# Patient Record
Sex: Female | Born: 1935
Health system: Southern US, Community
[De-identification: ages and names within clinical notes are randomized; demographics above are authoritative.]

## PROBLEM LIST (undated history)

## (undated) DIAGNOSIS — E785 Hyperlipidemia, unspecified: Secondary | ICD-10-CM

## (undated) DIAGNOSIS — I10 Essential (primary) hypertension: Secondary | ICD-10-CM

## (undated) HISTORY — DX: Essential (primary) hypertension: I10

## (undated) HISTORY — PX: ABDOMINAL HYSTERECTOMY: SHX81

## (undated) HISTORY — DX: Hyperlipidemia, unspecified: E78.5

## (undated) HISTORY — PX: TONSILLECTOMY: SUR1361

---

## 2001-03-30 ENCOUNTER — Other Ambulatory Visit: Admission: RE | Admit: 2001-03-30 | Discharge: 2001-03-30 | Payer: Self-pay | Admitting: Family Medicine

## 2001-11-05 ENCOUNTER — Encounter: Admission: RE | Admit: 2001-11-05 | Discharge: 2001-11-05 | Payer: Self-pay | Admitting: Family Medicine

## 2001-11-05 ENCOUNTER — Encounter: Payer: Self-pay | Admitting: Family Medicine

## 2003-05-20 ENCOUNTER — Other Ambulatory Visit: Admission: RE | Admit: 2003-05-20 | Discharge: 2003-05-20 | Payer: Self-pay | Admitting: Family Medicine

## 2004-05-04 ENCOUNTER — Encounter: Admission: RE | Admit: 2004-05-04 | Discharge: 2004-08-02 | Payer: Self-pay | Admitting: Family Medicine

## 2005-01-01 ENCOUNTER — Encounter: Admission: RE | Admit: 2005-01-01 | Discharge: 2005-01-01 | Payer: Self-pay | Admitting: Family Medicine

## 2005-10-14 ENCOUNTER — Encounter: Admission: RE | Admit: 2005-10-14 | Discharge: 2005-10-14 | Payer: Self-pay | Admitting: Family Medicine

## 2007-01-14 ENCOUNTER — Encounter: Admission: RE | Admit: 2007-01-14 | Discharge: 2007-01-14 | Payer: Self-pay | Admitting: Family Medicine

## 2008-04-25 ENCOUNTER — Encounter: Admission: RE | Admit: 2008-04-25 | Discharge: 2008-04-25 | Payer: Self-pay | Admitting: Family Medicine

## 2008-06-21 LAB — CONVERTED CEMR LAB: Pap Smear: NORMAL

## 2008-12-22 ENCOUNTER — Ambulatory Visit: Payer: Self-pay | Admitting: Internal Medicine

## 2008-12-22 DIAGNOSIS — I10 Essential (primary) hypertension: Secondary | ICD-10-CM | POA: Insufficient documentation

## 2008-12-22 DIAGNOSIS — R32 Unspecified urinary incontinence: Secondary | ICD-10-CM | POA: Insufficient documentation

## 2009-03-23 ENCOUNTER — Telehealth: Payer: Self-pay | Admitting: Internal Medicine

## 2009-04-26 ENCOUNTER — Ambulatory Visit: Payer: Self-pay | Admitting: Internal Medicine

## 2009-06-12 ENCOUNTER — Encounter: Admission: RE | Admit: 2009-06-12 | Discharge: 2009-06-12 | Payer: Self-pay | Admitting: Internal Medicine

## 2009-07-10 ENCOUNTER — Telehealth: Payer: Self-pay | Admitting: Internal Medicine

## 2009-07-13 ENCOUNTER — Telehealth: Payer: Self-pay | Admitting: Internal Medicine

## 2009-10-09 ENCOUNTER — Telehealth: Payer: Self-pay | Admitting: Internal Medicine

## 2010-09-09 LAB — CONVERTED CEMR LAB
ALT: 16 units/L (ref 0–35)
AST: 15 units/L (ref 0–37)
Albumin: 3.9 g/dL (ref 3.5–5.2)
Alkaline Phosphatase: 54 units/L (ref 39–117)
BUN: 12 mg/dL (ref 6–23)
Basophils Absolute: 0.1 10*3/uL (ref 0.0–0.1)
Basophils Relative: 1 % (ref 0.0–3.0)
Bilirubin Urine: NEGATIVE
Bilirubin, Direct: 0.1 mg/dL (ref 0.0–0.3)
CO2: 31 meq/L (ref 19–32)
Calcium: 10 mg/dL (ref 8.4–10.5)
Chloride: 104 meq/L (ref 96–112)
Cholesterol: 213 mg/dL — ABNORMAL HIGH (ref 0–200)
Creatinine, Ser: 1.2 mg/dL (ref 0.4–1.2)
Direct LDL: 130.3 mg/dL
Eosinophils Absolute: 0.4 10*3/uL (ref 0.0–0.7)
Eosinophils Relative: 4.7 % (ref 0.0–5.0)
GFR calc non Af Amer: 46.57 mL/min (ref >60)
Glucose, Bld: 96 mg/dL (ref 70–99)
HCT: 40.2 % (ref 36.0–46.0)
HDL: 55 mg/dL (ref >39.00)
Hemoglobin: 13.8 g/dL (ref 12.0–15.0)
Ketones, ur: NEGATIVE mg/dL
Leukocytes, UA: NEGATIVE
Lymphocytes Relative: 28.5 % (ref 12.0–46.0)
Lymphs Abs: 2.2 10*3/uL (ref 0.7–4.0)
MCHC: 34.2 g/dL (ref 30.0–36.0)
MCV: 93.1 fL (ref 78.0–100.0)
Monocytes Absolute: 0.7 10*3/uL (ref 0.1–1.0)
Monocytes Relative: 9.7 % (ref 3.0–12.0)
Neutro Abs: 4.3 10*3/uL (ref 1.4–7.7)
Neutrophils Relative %: 56.1 % (ref 43.0–77.0)
Nitrite: NEGATIVE
Platelets: 165 10*3/uL (ref 150.0–400.0)
Potassium: 4.4 meq/L (ref 3.5–5.1)
RBC: 4.32 M/uL (ref 3.87–5.11)
RDW: 11.9 % (ref 11.5–14.6)
Sodium: 142 meq/L (ref 135–145)
Specific Gravity, Urine: 1.025 (ref 1.000–1.030)
TSH: 1.01 microintl units/mL (ref 0.35–5.50)
Total Bilirubin: 0.9 mg/dL (ref 0.3–1.2)
Total CHOL/HDL Ratio: 4
Total Protein, Urine: NEGATIVE mg/dL
Total Protein: 6.8 g/dL (ref 6.0–8.3)
Triglycerides: 182 mg/dL — ABNORMAL HIGH (ref 0.0–149.0)
Urine Glucose: NEGATIVE mg/dL
Urobilinogen, UA: 0.2 (ref 0.0–1.0)
VLDL: 36.4 mg/dL (ref 0.0–40.0)
WBC: 7.7 10*3/uL (ref 4.5–10.5)
pH: 6 (ref 5.0–8.0)

## 2010-09-13 NOTE — Progress Notes (Signed)
Summary: MICARDIS  Phone Note Call from Patient   Summary of Call: See previous phone note, Rx pending signature Initial call taken by: Lamar Sprinkles, CMA,  July 13, 2009 6:50 PM  Follow-up for Phone Call        Pt informed to pick up Follow-up by: Lamar Sprinkles, CMA,  July 14, 2009 11:41 AM    New/Updated Medications: MICARDIS 40 MG TABS (TELMISARTAN) 1 once daily Prescriptions: MICARDIS 40 MG TABS (TELMISARTAN) 1 once daily  #90 x 3   Entered by:   Lamar Sprinkles, CMA   Authorized by:   Etta Grandchild MD   Signed by:   Lamar Sprinkles, CMA on 07/13/2009   Method used:   Print then Give to Patient   RxID:   [id number]

## 2010-09-13 NOTE — Progress Notes (Signed)
Summary: change med  Phone Note Refill Request Message from:  Fax from Pharmacy on October 09, 2009 1:56 PM  Refills Requested: Medication #1:  TOPROL XL 50 MG XR24H-TAB Take 1 tablet by mouth once a day Note wants to know if patient can change to Metoprolol Tartrate 50mg .   Please advise.  Initial call taken by: Lucious Groves,  October 09, 2009 1:56 PM  Follow-up for Phone Call        done Follow-up by: Etta Grandchild MD,  October 09, 2009 3:25 PM    New/Updated Medications: METOPROLOL TARTRATE 25 MG TABS (METOPROLOL TARTRATE) One by mouth BID Prescriptions: METOPROLOL TARTRATE 25 MG TABS (METOPROLOL TARTRATE) One by mouth BID  #60 x 11   Entered and Authorized by:   Etta Grandchild MD   Signed by:   Etta Grandchild MD on 10/09/2009   Method used:   Electronically to        [facility]* (retail)       [address] [place], [place]  [address]       Ph: [phone]       Fax: [phone]   RxID:   [id number]

## 2010-09-13 NOTE — Letter (Signed)
Summary: Lipid Letter  [facility]  [address] [name], [place] [address]   Phone: [phone]  Fax: [phone]    04/26/2009  Marcelia Petersen [address] [place], [place]  [address]  Dear Ms. Fullilove:  We have carefully reviewed your last lipid profile from  and the results are noted below with a summary of recommendations for lipid management.    Cholesterol:       213     Goal: <200   HDL "good" Cholesterol:   40.34     Goal: >40   LDL "bad" Cholesterol:   130     Goal: <130   Triglycerides:       182.0     Goal: <150    the fat level in your body is too high. other labs look  great.    TLC Diet (Therapeutic Lifestyle Change): Saturated Fats & Transfatty acids should be kept < 7% of total calories ***Reduce Saturated Fats Polyunstaurated Fat can be up to 10% of total calories Monounsaturated Fat Fat can be up to 20% of total calories Total Fat should be no greater than 25-35% of total calories Carbohydrates should be 50-60% of total calories Protein should be approximately 15% of total calories Fiber should be at least 20-30 grams a day ***Increased fiber may help lower LDL Total Cholesterol should be < 200mg /day Consider adding plant stanol/sterols to diet (example: Benacol spread) ***A higher intake of unsaturated fat may reduce Triglycerides and Increase HDL    Adjunctive Measures (may lower LIPIDS and reduce risk of Heart Attack) include: Aerobic Exercise (20-30 minutes 3-4 times a week) Limit Alcohol Consumption Weight Reduction Aspirin 75-81 mg a day by mouth (if not allergic or contraindicated) Dietary Fiber 20-30 grams a day by mouth     Current Medications: 1)    Toprol Xl 50 Mg Xr24h-tab (Metoprolol succinate) .... Take 1 tablet by mouth once a day 2)    Multivitamins   Tabs (Multiple vitamin) .... Take 1 tablet by mouth once a day  If you have any questions, please call. We appreciate being able to work with you.   Sincerely,     [facility] Etta Grandchild MD

## 2010-09-13 NOTE — Progress Notes (Signed)
Summary: refill  Phone Note Refill Request Message from:  Fax from Pharmacy on March 23, 2009 12:07 PM  Refills Requested: Medication #1:  PREMARIN 0.45 MG TABS Take 1 tablet by mouth once a day  Medication #2:  PREMARIN 0.45 MG TABS Take 1 tablet by mouth once a day  Method Requested: Electronic Initial call taken by: Rock Nephew CMA,  March 23, 2009 12:07 PM    Prescriptions: PREMARIN 0.45 MG TABS (ESTROGENS CONJUGATED) Take 1 tablet by mouth once a day  #90 x 1   Entered by:   Rock Nephew CMA   Authorized by:   Etta Grandchild MD   Signed by:   Rock Nephew CMA on 03/23/2009   Method used:   Electronically to        [facility]* (retail)       [address] [place], [place]  [address]       Ph: [phone]       Fax: [phone]   RxID:   [id number] TOPROL XL 50 MG XR24H-TAB (METOPROLOL SUCCINATE) Take 1 tablet by mouth once a day  #90 x 1   Entered by:   Rock Nephew CMA   Authorized by:   Etta Grandchild MD   Signed by:   Rock Nephew CMA on 03/23/2009   Method used:   Electronically to        [facility]* (retail)       [address] [place], [place]  [address]       Ph: [phone]       Fax: [phone]   RxID:   [id number]

## 2010-09-13 NOTE — Assessment & Plan Note (Signed)
Summary: YEARLY FU/ MEDICARE/ LABS AFTER/NWS $50   Vital Signs:  Patient profile:   77 year old female Height:      66 inches Weight:      166 pounds BMI:     26.89 O2 Sat:      96 % on Room air Temp:     97.3 degrees F oral Pulse rate:   60 / minute Pulse rhythm:   regular Resp:     16 per minute BP sitting:   108 / 64  (left arm) Cuff size:   large  Vitals Entered By: Rock Nephew CMA (April 26, 2009 8:47 AM)  Nutrition Counseling: Patient's BMI is greater than 25 and therefore counseled on weight management options.  O2 Flow:  Room air  Primary Care Provider:  Etta Grandchild MD   History of Present Illness: She reports for a complete physical and has no complaints.  Hypertension History:      She denies headache, chest pain, palpitations, dyspnea with exertion, orthopnea, PND, peripheral edema, visual symptoms, neurologic problems, syncope, and side effects from treatment.  She notes no problems with any antihypertensive medication side effects.        Positive major cardiovascular risk factors include female age 26 years old or older and hypertension.  Negative major cardiovascular risk factors include no history of diabetes or hyperlipidemia, negative family history for ischemic heart disease, and non-tobacco-user status.        Further assessment for target organ damage reveals no history of ASHD, cardiac end-organ damage (CHF/LVH), stroke/TIA, peripheral vascular disease, renal insufficiency, or hypertensive retinopathy.     Clinical Review Panels:  Prevention   Last Mammogram:  Normal Bilateral (05/24/2008)   Last Pap Smear:  Normal (06/21/2008)   Current Medications (verified): 1)  Toprol Xl 50 Mg Xr24h-Tab (Metoprolol Succinate) .... Take 1 Tablet By Mouth Once A Day 2)  Multivitamins   Tabs (Multiple Vitamin) .... Take 1 Tablet By Mouth Once A Day 3)  Premarin 0.45 Mg Tabs (Estrogens Conjugated) .... Take 1 Tablet By Mouth Once A Day  Allergies  (verified): 1)  ! Codeine  Past History:  Past Medical History: Reviewed history from 12/22/2008 and no changes required. Hypertension Urinary incontinence  Past Surgical History: Reviewed history from 12/22/2008 and no changes required. Hysterectomy Tonsillectomy  Family History: Reviewed history from 12/22/2008 and no changes required. Family History Diabetes 1st degree relative Family History High cholesterol Family History of Stroke M 1st degree relative <50  Social History: Reviewed history from 12/22/2008 and no changes required. Retired Married Alcohol use-yes Drug use-no Regular exercise-no  Review of Systems  The patient denies anorexia, fever, weight loss, weight gain, dyspnea on exertion, peripheral edema, prolonged cough, headaches, hemoptysis, abdominal pain, melena, hematochezia, severe indigestion/heartburn, hematuria, incontinence, suspicious skin lesions, difficulty walking, depression, enlarged lymph nodes, angioedema, and breast masses.   GU:  Denies abnormal vaginal bleeding, discharge, dysuria, nocturia, urinary frequency, and urinary hesitancy.  Physical Exam  General:  alert, well-developed, well-nourished, well-hydrated, normal appearance, healthy-appearing, cooperative to examination, and good hygiene.   Head:  normocephalic and atraumatic.   Eyes:  vision grossly intact, pupils equal, and pupils round.   Mouth:  Oral mucosa and oropharynx without lesions or exudates.  Teeth in good repair. Neck:  supple, full ROM, no masses, no thyromegaly, no thyroid nodules or tenderness, normal carotid upstroke, no carotid bruits, and no cervical lymphadenopathy.   Chest Wall:  No deformities, masses, or tenderness noted. Breasts:  No mass,  nodules, thickening, tenderness, bulging, retraction, inflamation, nipple discharge or skin changes noted.   Lungs:  Normal respiratory effort, chest expands symmetrically. Lungs are clear to auscultation, no crackles or  wheezes. Heart:  Normal rate and regular rhythm. S1 and S2 normal without gallop, murmur, click, rub or other extra sounds. Abdomen:  soft, non-tender, normal bowel sounds, no distention, no masses, no guarding, no rigidity, no rebound tenderness, no hepatomegaly, and no splenomegaly.   Rectal:  no external abnormalities, normal sphincter tone, no masses, no tenderness, no fissures, no fistulae, no perianal rash, and external hemorrhoid(s).  heme negative stool. Msk:  normal ROM, no joint tenderness, no joint swelling, and no joint warmth.   Pulses:  R and L carotid,radial,femoral,dorsalis pedis and posterior tibial pulses are full and equal bilaterally Extremities:  No clubbing, cyanosis, edema, or deformity noted with normal full range of motion of all joints.   Neurologic:  No cranial nerve deficits noted. Station and gait are normal. Plantar reflexes are down-going bilaterally. DTRs are symmetrical throughout. Sensory, motor and coordinative functions appear intact. Skin:  turgor normal, color normal, no rashes, no suspicious lesions, no ecchymoses, no petechiae, no purpura, no ulcerations, no edema, actinic keratosis, and seborrheic keratosis.   Cervical Nodes:  No lymphadenopathy noted Axillary Nodes:  No palpable lymphadenopathy Inguinal Nodes:  No significant adenopathy Psych:  Cognition and judgment appear intact. Alert and cooperative with normal attention span and concentration. No apparent delusions, illusions, hallucinations Additional Exam:  EKG shows NSR with poor RWP but no Q waves, no LVH, and no ST/T wave abnormalities.   Impression & Recommendations:  Problem # 1:  ROUTINE GENERAL MEDICAL EXAM@HEALTH  CARE FACL (ICD-V70.0) Assessment New she refuses to do a colonoscopy but has a mammogram scheduled and has had a recent normal PAP smear, so those issues are addressed. Orders: Venipuncture (25956) TLB-Lipid Panel (80061-LIPID) TLB-BMP (Basic Metabolic Panel-BMET)  (80048-METABOL) TLB-CBC Platelet - w/Differential (85025-CBCD) TLB-Hepatic/Liver Function Pnl (80076-HEPATIC) TLB-TSH (Thyroid Stimulating Hormone) (84443-TSH) TLB-Udip w/ Micro (81001-URINE) Hemoccult Guaiac-1 spec.(in office) (82270) EKG w/ Interpretation (93000)  Problem # 2:  HYPERTENSION (ICD-401.9) Assessment: Improved  Her updated medication list for this problem includes:    Toprol Xl 50 Mg Xr24h-tab (Metoprolol succinate) .Marland Kitchen... Take 1 tablet by mouth once a day  Orders: Venipuncture (38756) TLB-Lipid Panel (80061-LIPID) TLB-BMP (Basic Metabolic Panel-BMET) (80048-METABOL) TLB-CBC Platelet - w/Differential (85025-CBCD) TLB-Hepatic/Liver Function Pnl (80076-HEPATIC) TLB-TSH (Thyroid Stimulating Hormone) (84443-TSH) TLB-Udip w/ Micro (81001-URINE) EKG w/ Interpretation (93000)  Problem # 3:  URINARY INCONTINENCE (ICD-788.30) Assessment: Improved  Orders: Venipuncture (43329) TLB-Lipid Panel (80061-LIPID) TLB-BMP (Basic Metabolic Panel-BMET) (80048-METABOL) TLB-CBC Platelet - w/Differential (85025-CBCD) TLB-Hepatic/Liver Function Pnl (80076-HEPATIC) TLB-TSH (Thyroid Stimulating Hormone) (84443-TSH) TLB-Udip w/ Micro (81001-URINE)  Complete Medication List: 1)  Toprol Xl 50 Mg Xr24h-tab (Metoprolol succinate) .... Take 1 tablet by mouth once a day 2)  Multivitamins Tabs (Multiple vitamin) .... Take 1 tablet by mouth once a day  Hypertension Assessment/Plan:      The patient's hypertensive risk group is category B: At least one risk factor (excluding diabetes) with no target organ damage.  Today's blood pressure is 108/64.  Her blood pressure goal is < 140/90.  Patient Instructions: 1)  Please schedule a follow-up appointment in 6 months. 2)  It is important that you exercise regularly at least 20 minutes 5 times a week. If you develop chest pain, have severe difficulty breathing, or feel very tired , stop exercising immediately and seek medical attention. 3)  You  need to lose  weight. Consider a lower calorie diet and regular exercise.  4)  Schedule your mammogram. 5)  Check your Blood Pressure regularly. If it is above 140/90: you should make an appointment. Prescriptions: TOPROL XL 50 MG XR24H-TAB (METOPROLOL SUCCINATE) Take 1 tablet by mouth once a day  #90 x 3   Entered and Authorized by:   Etta Grandchild MD   Signed by:   Etta Grandchild MD on 04/26/2009   Method used:   Print then Give to Patient   RxID:   [id number]

## 2010-09-13 NOTE — Progress Notes (Signed)
Summary: TOPROL  Phone Note Call from Patient   Summary of Call: Pt lost rx for toprol, ok to reprint for pt to pick up?  Initial call taken by: Lamar Sprinkles, CMA,  July 10, 2009 2:43 PM  Follow-up for Phone Call        yes Follow-up by: Etta Grandchild MD,  July 10, 2009 2:57 PM     Appended Document: TOPROL patient notified to pick up/la

## 2010-09-13 NOTE — Assessment & Plan Note (Signed)
Summary: NEW PT MEIDCARE/$50/PAPER WORK/PN   Vital Signs:  Patient profile:   77 year old female Height:      66 inches Weight:      170 pounds BMI:     27.54 O2 Sat:      97 % Temp:     98.0 degrees F oral Pulse rate:   55 / minute BP sitting:   132 / 82  (left arm) Cuff size:   large  Vitals Entered By: Zackery Barefoot CMA (Dec 22, 2008 4:03 PM)  Nutrition Counseling: Patient's BMI is greater than 25 and therefore counseled on weight management options.  Primary Care Provider:  Etta Grandchild MD   History of Present Illness: This is a new pt. to me who has no complaints. She simply needs a new PCP.  Hypertension History:      She denies headache, chest pain, palpitations, dyspnea with exertion, orthopnea, PND, peripheral edema, visual symptoms, neurologic problems, syncope, and side effects from treatment.  She notes no problems with any antihypertensive medication side effects.        Positive major cardiovascular risk factors include female age 65 years old or older and hypertension.  Negative major cardiovascular risk factors include no history of diabetes or hyperlipidemia, negative family history for ischemic heart disease, and non-tobacco-user status.        Further assessment for target organ damage reveals no history of ASHD, cardiac end-organ damage (CHF/LVH), stroke/TIA, peripheral vascular disease, renal insufficiency, or hypertensive retinopathy.     Preventive Screening-Counseling & Management     Alcohol drinks/day: <1     Alcohol type: wine     >5/day in last 3 mos: no     Alcohol Counseling: not indicated; use of alcohol is not excessive or problematic     Feels need to cut down: no     Feels annoyed by complaints: no     Feels guilty re: drinking: no     Needs 'eye opener' in am: no     Smoking Status: quit     Does Patient Exercise: no      Drug Use:  no.    Current Medications (verified): 1)  Toprol Xl 50 Mg Xr24h-Tab (Metoprolol Succinate) ....  Take 1 Tablet By Mouth Once A Day 2)  Multivitamins   Tabs (Multiple Vitamin) .... Take 1 Tablet By Mouth Once A Day 3)  Premarin 0.45 Mg Tabs (Estrogens Conjugated) .... Take 1 Tablet By Mouth Once A Day 4)  Toviaz 4 Mg Xr24h-Tab (Fesoterodine Fumarate) .... Take 1 Tablet By Mouth Once A Day  Allergies (verified): 1)  ! Codeine  Past History:  Past Medical History:    Hypertension    Urinary incontinence  Past Surgical History:    Hysterectomy    Tonsillectomy  Family History:    Family History Diabetes 1st degree relative    Family History High cholesterol    Family History of Stroke M 1st degree relative <50  Social History:    Retired    Married    Alcohol use-yes    Drug use-no    Regular exercise-no    Smoking Status:  quit    Drug Use:  no    Does Patient Exercise:  no  Review of Systems  The patient denies weight loss, weight gain, abdominal pain, difficulty walking, depression, and breast masses.    Physical Exam  General:  alert, well-developed, well-nourished, well-hydrated, and overweight-appearing.   Eyes:  vision grossly intact and  pupils equal.   Neck:  supple, full ROM, no masses, no thyromegaly, no thyroid nodules or tenderness, normal carotid upstroke, and no carotid bruits.   Lungs:  Normal respiratory effort, chest expands symmetrically. Lungs are clear to auscultation, no crackles or wheezes. Heart:  Normal rate and regular rhythm. S1 and S2 normal without gallop, murmur, click, rub or other extra sounds. Abdomen:  soft, non-tender, normal bowel sounds, no distention, no masses, no guarding, no hepatomegaly, and no splenomegaly.   Msk:  normal ROM, no joint tenderness, no joint swelling, no joint warmth, no joint instability, and no crepitation.   Pulses:  R and L carotid,radial,femoral,dorsalis pedis and posterior tibial pulses are full and equal bilaterally Extremities:  No clubbing, cyanosis, edema, or deformity noted with normal full range of  motion of all joints.   Neurologic:  No cranial nerve deficits noted. Station and gait are normal. Plantar reflexes are down-going bilaterally. DTRs are symmetrical throughout. Sensory, motor and coordinative functions appear intact. Skin:  turgor normal, color normal, no rashes, no suspicious lesions, and no ecchymoses.   Cervical Nodes:  No lymphadenopathy noted Psych:  Cognition and judgment appear intact. Alert and cooperative with normal attention span and concentration. No apparent delusions, illusions, hallucinations   Impression & Recommendations:  Problem # 1:  HYPERTENSION (ICD-401.9) Assessment Improved  Her updated medication list for this problem includes:    Toprol Xl 50 Mg Xr24h-tab (Metoprolol succinate) .Marland Kitchen... Take 1 tablet by mouth once a day  Complete Medication List: 1)  Toprol Xl 50 Mg Xr24h-tab (Metoprolol succinate) .... Take 1 tablet by mouth once a day 2)  Multivitamins Tabs (Multiple vitamin) .... Take 1 tablet by mouth once a day 3)  Premarin 0.45 Mg Tabs (Estrogens conjugated) .... Take 1 tablet by mouth once a day 4)  Toviaz 4 Mg Xr24h-tab (Fesoterodine fumarate) .... Take 1 tablet by mouth once a day  Hypertension Assessment/Plan:      The patient's hypertensive risk group is category B: At least one risk factor (excluding diabetes) with no target organ damage.  Today's blood pressure is 132/82.  Her blood pressure goal is < 140/90.  Colorectal Screening:  Current Recommendations:    Colonoscopy recommended: patient defers today but will consider in the future  PAP Screening:    Hx Cervical Dysplasia in last 5 yrs? No    3 normal PAP smears in last 5 yrs? Yes    Last PAP smear:  06/21/2008  PAP Smear Results:    Date of Exam:  06/21/2008    Results:  Normal  Mammogram Screening:    Last Mammogram:  05/24/2008  Mammogram Results:    Date of Exam:  05/24/2008    Results:  Normal Bilateral  Osteoporosis Risk Assessment:  Risk Factors for  Fracture or Low Bone Density:   Race (White or Asian):     yes   Smoking status:       quit  Patient Instructions: 1)  Please schedule a follow-up appointment in 4 months. 2)  It is important that you exercise regularly at least 20 minutes 5 times a week. If you develop chest pain, have severe difficulty breathing, or feel very tired , stop exercising immediately and seek medical attention. 3)  You need to lose weight. Consider a lower calorie diet and regular exercise.  4)  Schedule your mammogram. 5)  Check your Blood Pressure regularly. If it is above: you should make an appointment.

## 2010-09-13 NOTE — Progress Notes (Signed)
Summary: MICARDIS  Phone Note Call from Patient Call back at [facility] Phone [phone]   Summary of Call: Pt says that rx that she picked up was wrong. She wants rx for Micardis 40mg . OK?  Initial call taken by: Lamar Sprinkles, CMA,  July 13, 2009 8:51 AM  Follow-up for Phone Call        sure Follow-up by: Etta Grandchild MD,  July 13, 2009 9:00 AM

## 2010-09-20 ENCOUNTER — Encounter: Payer: Self-pay | Admitting: Internal Medicine

## 2010-09-20 ENCOUNTER — Encounter (INDEPENDENT_AMBULATORY_CARE_PROVIDER_SITE_OTHER): Payer: Self-pay | Admitting: *Deleted

## 2010-09-20 ENCOUNTER — Other Ambulatory Visit: Payer: Medicare Other

## 2010-09-20 ENCOUNTER — Encounter (INDEPENDENT_AMBULATORY_CARE_PROVIDER_SITE_OTHER): Payer: Medicare Other | Admitting: Internal Medicine

## 2010-09-20 ENCOUNTER — Other Ambulatory Visit: Payer: Self-pay | Admitting: Internal Medicine

## 2010-09-20 DIAGNOSIS — R9431 Abnormal electrocardiogram [ECG] [EKG]: Secondary | ICD-10-CM

## 2010-09-20 DIAGNOSIS — Z2911 Encounter for prophylactic immunotherapy for respiratory syncytial virus (RSV): Secondary | ICD-10-CM

## 2010-09-20 DIAGNOSIS — Z23 Encounter for immunization: Secondary | ICD-10-CM

## 2010-09-20 DIAGNOSIS — I10 Essential (primary) hypertension: Secondary | ICD-10-CM

## 2010-09-20 DIAGNOSIS — E785 Hyperlipidemia, unspecified: Secondary | ICD-10-CM | POA: Insufficient documentation

## 2010-09-20 DIAGNOSIS — Z0000 Encounter for general adult medical examination without abnormal findings: Secondary | ICD-10-CM

## 2010-09-20 DIAGNOSIS — Z136 Encounter for screening for cardiovascular disorders: Secondary | ICD-10-CM

## 2010-09-20 LAB — CBC WITH DIFFERENTIAL/PLATELET
Basophils Absolute: 0.1 10*3/uL (ref 0.0–0.1)
Basophils Relative: 0.8 % (ref 0.0–3.0)
Eosinophils Absolute: 0.5 10*3/uL (ref 0.0–0.7)
Eosinophils Relative: 4.9 % (ref 0.0–5.0)
HCT: 43 % (ref 36.0–46.0)
Hemoglobin: 15 g/dL (ref 12.0–15.0)
Lymphocytes Relative: 29.3 % (ref 12.0–46.0)
Lymphs Abs: 2.8 10*3/uL (ref 0.7–4.0)
MCHC: 34.8 g/dL (ref 30.0–36.0)
MCV: 90.9 fl (ref 78.0–100.0)
Monocytes Absolute: 0.9 10*3/uL (ref 0.1–1.0)
Monocytes Relative: 9.6 % (ref 3.0–12.0)
Neutro Abs: 5.2 10*3/uL (ref 1.4–7.7)
Neutrophils Relative %: 55.4 % (ref 43.0–77.0)
Platelets: 149 10*3/uL — ABNORMAL LOW (ref 150.0–400.0)
RBC: 4.73 Mil/uL (ref 3.87–5.11)
RDW: 13.1 % (ref 11.5–14.6)
WBC: 9.4 10*3/uL (ref 4.5–10.5)

## 2010-09-20 LAB — BASIC METABOLIC PANEL
BUN: 16 mg/dL (ref 6–23)
CO2: 26 mEq/L (ref 19–32)
Calcium: 10.2 mg/dL (ref 8.4–10.5)
Chloride: 102 mEq/L (ref 96–112)
Creatinine, Ser: 1 mg/dL (ref 0.4–1.2)
GFR: 54.73 mL/min — ABNORMAL LOW (ref >60.00)
Glucose, Bld: 84 mg/dL (ref 70–99)
Potassium: 4.6 mEq/L (ref 3.5–5.1)
Sodium: 135 mEq/L (ref 135–145)

## 2010-09-20 LAB — LDL CHOLESTEROL, DIRECT: Direct LDL: 132.6 mg/dL

## 2010-09-20 LAB — HEPATIC FUNCTION PANEL
ALT: 14 U/L (ref 0–35)
AST: 13 U/L (ref 0–37)
Albumin: 4 g/dL (ref 3.5–5.2)
Alkaline Phosphatase: 62 U/L (ref 39–117)
Bilirubin, Direct: 0.2 mg/dL (ref 0.0–0.3)
Total Bilirubin: 0.8 mg/dL (ref 0.3–1.2)
Total Protein: 6.9 g/dL (ref 6.0–8.3)

## 2010-09-20 LAB — LIPID PANEL
Cholesterol: 213 mg/dL — ABNORMAL HIGH (ref 0–200)
HDL: 55.8 mg/dL (ref >39.00)
Total CHOL/HDL Ratio: 4
Triglycerides: 191 mg/dL — ABNORMAL HIGH (ref 0.0–149.0)
VLDL: 38.2 mg/dL (ref 0.0–40.0)

## 2010-09-20 LAB — TSH: TSH: 1.26 u[IU]/mL (ref 0.35–5.50)

## 2010-09-24 ENCOUNTER — Other Ambulatory Visit: Payer: Self-pay | Admitting: Internal Medicine

## 2010-09-24 DIAGNOSIS — Z1231 Encounter for screening mammogram for malignant neoplasm of breast: Secondary | ICD-10-CM

## 2010-09-27 NOTE — Letter (Signed)
Summary: Lipid Letter  [facility]  [address] [place], [place] [address]   Phone: [phone]  Fax: [phone]    09/20/2010  [name] [address] [place], [place]  [address]  Dear Ms. Neumeister:  We have carefully reviewed your last lipid profile from  and the results are noted below with a summary of recommendations for lipid management.    Cholesterol:       213     Goal: <200   HDL "good" Cholesterol:   34.74     Goal: >50   LDL "bad" Cholesterol:   133     Goal: <130   Triglycerides:       191.0     Goal: <150 wow, high!        TLC Diet (Therapeutic Lifestyle Change): Saturated Fats & Transfatty acids should be kept < 7% of total calories ***Reduce Saturated Fats Polyunstaurated Fat can be up to 10% of total calories Monounsaturated Fat Fat can be up to 20% of total calories Total Fat should be no greater than 25-35% of total calories Carbohydrates should be 50-60% of total calories Protein should be approximately 15% of total calories Fiber should be at least 20-30 grams a day ***Increased fiber may help lower LDL Total Cholesterol should be < 200mg /day Consider adding plant stanol/sterols to diet (example: Benacol spread) ***A higher intake of unsaturated fat may reduce Triglycerides and Increase HDL    Adjunctive Measures (may lower LIPIDS and reduce risk of Heart Attack) include: Aerobic Exercise (20-30 minutes 3-4 times a week) Limit Alcohol Consumption Weight Reduction Aspirin 75-81 mg a day by mouth (if not allergic or contraindicated) Dietary Fiber 20-30 grams a day by mouth     Current Medications: 1)    Metoprolol Tartrate 25 Mg Tabs (Metoprolol tartrate) .... One by mouth bid 2)    Multivitamins   Tabs (Multiple vitamin) .... Take 1 tablet by mouth once a day  If you have any questions, please call. We appreciate being able to work with you.   Sincerely,    [facility] Etta Grandchild MD

## 2010-09-27 NOTE — Assessment & Plan Note (Signed)
Summary: YEARLY-MEDICARE---STC   Vital Signs:  Patient profile:   77 year old female Menstrual status:  hysterectomy Height:      66 inches Weight:      166 pounds BMI:     26.89 O2 Sat:      97 % on Room air Temp:     97.6 degrees F oral Pulse rate:   80 / minute Pulse rhythm:   regular Resp:     16 per minute BP supine:   104 / 70  (right arm) BP sitting:   102 / 68  (left arm) Cuff size:   large  Vitals Entered By: Rock Nephew CMA (September 20, 2010 10:58 AM)  Nutrition Counseling: Patient's BMI is greater than 25 and therefore counseled on weight management options.  O2 Flow:  Room air  Primary Care Provider:  Etta Grandchild MD   History of Present Illness: Here for Medicare AWV:  1.   Risk factors based on Past M, S, F history: yes 2.   Physical Activities: very good 3.   Depression/mood: mood is very good 4.   Hearing: hears whispered voice at 3 feet 5.   ADL's: thorough and independent 6.   Fall Risk: none noted 7.   Home Safety: very good 8.   Height, weight, &visual acuity: yes 9.   Counseling: done 10.   Labs ordered based on risk factors: done 11.           Referral Coordination: none at this time 12.           Care Plan: completed 13.            Cognitive Assessment : she responds well to all questions  Hypertension History:      She denies headache, chest pain, palpitations, dyspnea with exertion, orthopnea, PND, peripheral edema, visual symptoms, neurologic problems, syncope, and side effects from treatment.  She notes no problems with any antihypertensive medication side effects.        Positive major cardiovascular risk factors include female age 35 years old or older, hyperlipidemia, and hypertension.  Negative major cardiovascular risk factors include no history of diabetes, negative family history for ischemic heart disease, and non-tobacco-user status.        Further assessment for target organ damage reveals no history of ASHD, cardiac  end-organ damage (CHF/LVH), stroke/TIA, peripheral vascular disease, renal insufficiency, or hypertensive retinopathy.     Preventive Screening-Counseling & Management  Alcohol-Tobacco     Alcohol drinks/day: <1     Alcohol type: wine     >5/day in last 3 mos: no     Alcohol Counseling: not indicated; use of alcohol is not excessive or problematic     Feels need to cut down: no     Feels annoyed by complaints: no     Feels guilty re: drinking: no     Needs 'eye opener' in am: no     Smoking Status: quit     Tobacco Counseling: to remain off tobacco products  Hep-HIV-STD-Contraception     Hepatitis Risk: no risk noted     HIV Risk: no risk noted     STD Risk: no risk noted      Sexual History:  currently monogamous.        Drug Use:  never.        Blood Transfusions:  no.    Clinical Review Panels:  Prevention   Last Mammogram:  ASSESSMENT: Negative - BI-RADS 1^MM DIGITAL  SCREENING (06/12/2009)   Last Pap Smear:  Normal (06/21/2008)  Immunizations   Last Tetanus Booster:  Tdap (09/20/2010)   Last Flu Vaccine:  Historical (05/22/2010)   Last Zoster Vaccine:  Zostavax (09/20/2010)  Lipid Management   Cholesterol:  213 (04/26/2009)   HDL (good cholesterol):  55.00 (04/26/2009)  Diabetes Management   Creatinine:  1.2 (04/26/2009)   Last Flu Vaccine:  Historical (05/22/2010)  CBC   WBC:  7.7 (04/26/2009)   RBC:  4.32 (04/26/2009)   Hgb:  13.8 (04/26/2009)   Hct:  40.2 (04/26/2009)   Platelets:  165.0 (04/26/2009)   MCV  93.1 (04/26/2009)   MCHC  34.2 (04/26/2009)   RDW  11.9 (04/26/2009)   PMN:  56.1 (04/26/2009)   Lymphs:  28.5 (04/26/2009)   Monos:  9.7 (04/26/2009)   Eosinophils:  4.7 (04/26/2009)   Basophil:  1.0 (04/26/2009)  Complete Metabolic Panel   Glucose:  96 (04/26/2009)   Sodium:  142 (04/26/2009)   Potassium:  4.4 (04/26/2009)   Chloride:  104 (04/26/2009)   CO2:  31 (04/26/2009)   BUN:  12 (04/26/2009)   Creatinine:  1.2 (04/26/2009)    Albumin:  3.9 (04/26/2009)   Total Protein:  6.8 (04/26/2009)   Calcium:  10.0 (04/26/2009)   Total Bili:  0.9 (04/26/2009)   Alk Phos:  54 (04/26/2009)   SGPT (ALT):  16 (04/26/2009)   SGOT (AST):  15 (04/26/2009)   Medications Prior to Update: 1)  Metoprolol Tartrate 25 Mg Tabs (Metoprolol Tartrate) .... One By Mouth Bid 2)  Multivitamins   Tabs (Multiple Vitamin) .... Take 1 Tablet By Mouth Once A Day 3)  Micardis 40 Mg Tabs (Telmisartan) .Marland Kitchen.. 1 Once Daily  Current Medications (verified): 1)  Metoprolol Tartrate 25 Mg Tabs (Metoprolol Tartrate) .... One By Mouth Bid 2)  Multivitamins   Tabs (Multiple Vitamin) .... Take 1 Tablet By Mouth Once A Day  Allergies (verified): 1)  ! Codeine  Past History:  Past Surgical History: Last updated: 12/22/2008 Hysterectomy Tonsillectomy  Family History: Last updated: 12/22/2008 Family History Diabetes 1st degree relative Family History High cholesterol Family History of Stroke M 1st degree relative <50  Social History: Last updated: 12/22/2008 Retired Married Alcohol use-yes Drug use-no Regular exercise-no  Risk Factors: Alcohol Use: <1 (09/20/2010) >5 drinks/d w/in last 3 months: no (09/20/2010) Exercise: no (12/22/2008)  Risk Factors: Smoking Status: quit (09/20/2010)  Past Medical History: Hypertension Urinary incontinence Hyperlipidemia  Family History: Reviewed history from 12/22/2008 and no changes required. Family History Diabetes 1st degree relative Family History High cholesterol Family History of Stroke M 1st degree relative <50  Social History: Reviewed history from 12/22/2008 and no changes required. Retired Married Alcohol use-yes Drug use-no Regular exercise-no Hepatitis Risk:  no risk noted HIV Risk:  no risk noted STD Risk:  no risk noted Sexual History:  currently monogamous Drug Use:  never Blood Transfusions:  no  Review of Systems       The patient complains of weight gain.  The  patient denies anorexia, fever, weight loss, chest pain, syncope, dyspnea on exertion, peripheral edema, prolonged cough, headaches, hemoptysis, abdominal pain, melena, hematochezia, severe indigestion/heartburn, hematuria, suspicious skin lesions, transient blindness, difficulty walking, depression, unusual weight change, abnormal bleeding, enlarged lymph nodes, angioedema, and breast masses.    Physical Exam  General:  alert, well-developed, well-nourished, well-hydrated, normal appearance, healthy-appearing, cooperative to examination, and good hygiene.   Head:  normocephalic and atraumatic.   Eyes:  vision grossly intact, pupils equal, and  pupils round.   Mouth:  Oral mucosa and oropharynx without lesions or exudates.  Teeth in good repair. Neck:  supple, full ROM, no masses, no thyromegaly, no thyroid nodules or tenderness, normal carotid upstroke, no carotid bruits, and no cervical lymphadenopathy.   Breasts:  No mass, nodules, thickening, tenderness, bulging, retraction, inflamation, nipple discharge or skin changes noted.   Lungs:  Normal respiratory effort, chest expands symmetrically. Lungs are clear to auscultation, no crackles or wheezes. Heart:  Normal rate and regular rhythm. S1 and S2 normal without gallop, murmur, click, rub or other extra sounds. Abdomen:  soft, non-tender, normal bowel sounds, no distention, no masses, no guarding, no rigidity, no rebound tenderness, no hepatomegaly, and no splenomegaly.   Rectal:  deferred at her request Genitalia:  deferred at her request Msk:  normal ROM, no joint tenderness, no joint swelling, and no joint warmth.   Pulses:  R and L carotid,radial,femoral,dorsalis pedis and posterior tibial pulses are full and equal bilaterally Extremities:  No clubbing, cyanosis, edema, or deformity noted with normal full range of motion of all joints.   Neurologic:  No cranial nerve deficits noted. Station and gait are normal. Plantar reflexes are down-going  bilaterally. DTRs are symmetrical throughout. Sensory, motor and coordinative functions appear intact. Skin:  turgor normal, color normal, no rashes, no suspicious lesions, no ecchymoses, no petechiae, no purpura, no ulcerations, no edema, actinic keratosis, and seborrheic keratosis.   Cervical Nodes:  No lymphadenopathy noted Axillary Nodes:  no R axillary adenopathy and no L axillary adenopathy.   Inguinal Nodes:  no R inguinal adenopathy and no L inguinal adenopathy.   Psych:  Cognition and judgment appear intact. Alert and cooperative with normal attention span and concentration. No apparent delusions, illusions, hallucinations Additional Exam:  EKG is unchanged from prior EKG's   Impression & Recommendations:  Problem # 1:  HYPERLIPIDEMIA (ICD-272.4) Assessment New  Orders: Venipuncture (81191) TLB-Lipid Panel (80061-LIPID) TLB-BMP (Basic Metabolic Panel-BMET) (80048-METABOL) TLB-CBC Platelet - w/Differential (85025-CBCD) TLB-Hepatic/Liver Function Pnl (80076-HEPATIC) TLB-TSH (Thyroid Stimulating Hormone) (84443-TSH)  Problem # 2:  ROUTINE GENERAL MEDICAL EXAM@HEALTH  CARE FACL (ICD-V70.0) Assessment: Unchanged  Orders: Radiology Referral (Radiology) [facility] -Subsequent Annual Wellness Visit [phone])  Mammogram: ASSESSMENT: Negative - BI-RADS 1^MM DIGITAL SCREENING (06/12/2009) Pap smear: Normal (06/21/2008) Chol: 213 (04/26/2009)   HDL: 55.00 (04/26/2009)   TG: 182.0 (04/26/2009) TSH: 1.01 (04/26/2009)    Discussed using sunscreen, use of alcohol, drug use, self breast exam, routine dental care, routine eye care, schedule for GYN exam, routine physical exam, seat belts, multiple vitamins, osteoporosis prevention, adequate calcium intake in diet, recommendations for immunizations, mammograms and Pap smears.  Discussed exercise and checking cholesterol.  Discussed gun safety, safe sex, and contraception.  Problem # 3:  HYPERTENSION (ICD-401.9) Assessment: Improved  The following  medications were removed from the medication list:    Micardis 40 Mg Tabs (Telmisartan) .Marland Kitchen... 1 once daily Her updated medication list for this problem includes:    Metoprolol Tartrate 25 Mg Tabs (Metoprolol tartrate) ..... One by mouth bid  Orders: Venipuncture (56213) TLB-Lipid Panel (80061-LIPID) TLB-BMP (Basic Metabolic Panel-BMET) (80048-METABOL) TLB-CBC Platelet - w/Differential (85025-CBCD) TLB-Hepatic/Liver Function Pnl (80076-HEPATIC) TLB-TSH (Thyroid Stimulating Hormone) (84443-TSH)  BP today: 102/68 Prior BP: 108/64 (04/26/2009)  Prior 10 Yr Risk Heart Disease: Not enough information (12/22/2008)  Labs Reviewed: K+: 4.4 (04/26/2009) Creat: : 1.2 (04/26/2009)   Chol: 213 (04/26/2009)   HDL: 55.00 (04/26/2009)   TG: 182.0 (04/26/2009)  Problem # 4:  ABNORMAL ELECTROCARDIOGRAM (ICD-794.31) Assessment: New this  is a benign finding and is consistent with her history of lung disease Orders: EKG w/ Interpretation (93000)  Complete Medication List: 1)  Metoprolol Tartrate 25 Mg Tabs (Metoprolol tartrate) .... One by mouth bid 2)  Multivitamins Tabs (Multiple vitamin) .... Take 1 tablet by mouth once a day  Other Orders: Tdap => 67yrs IM (16109) Admin 1st Vaccine (60454) Zoster (Shingles) Vaccine Live [phone]) Admin of Any Addtl Vaccine (91478)  Hypertension Assessment/Plan:      The patient's hypertensive risk group is category B: At least one risk factor (excluding diabetes) with no target organ damage.  Today's blood pressure is 102/68.  Her blood pressure goal is < 140/90.  Colorectal Screening:  Current Recommendations:    Hemoccult: patient refused    Colonoscopy recommended: patient defers today but will consider in the future  PAP Screening:    Hx Cervical Dysplasia in last 5 yrs? No    3 normal PAP smears in last 5 yrs? Yes    Last PAP smear:  06/21/2008    Reviewed PAP smear recommendations:  patient refuses understanding risks of delayed  diagnosis  Mammogram Screening:    Last Mammogram:  06/12/2009    Reviewed Mammogram recommendations:  mammogram ordered  Osteoporosis Risk Assessment:  Risk Factors for Fracture or Low Bone Density:   Race (White or Asian):     yes   Smoking status:       quit  Immunization & Chemoprophylaxis:    Tetanus vaccine: Tdap  (09/20/2010)    Influenza vaccine: Historical  (05/22/2010)  Patient Instructions: 1)  Please schedule a follow-up appointment in 2 months. 2)  It is important that you exercise regularly at least 20 minutes 5 times a week. If you develop chest pain, have severe difficulty breathing, or feel very tired , stop exercising immediately and seek medical attention. 3)  You need to lose weight. Consider a lower calorie diet and regular exercise.  4)  Schedule your mammogram. 5)  Schedule a colonoscopy/sigmoidoscopy to help detect colon cancer. 6)  Take an Aspirin every day. 7)  Check your Blood Pressure regularly. If it is above 130/80: you should make an appointment.   Orders Added: 1)  Venipuncture [36415] 2)  TLB-Lipid Panel [80061-LIPID] 3)  TLB-BMP (Basic Metabolic Panel-BMET) [80048-METABOL] 4)  TLB-CBC Platelet - w/Differential [85025-CBCD] 5)  TLB-Hepatic/Liver Function Pnl [80076-HEPATIC] 6)  TLB-TSH (Thyroid Stimulating Hormone) [84443-TSH] 7)  Radiology Referral [Radiology] 8)  EKG w/ Interpretation [93000] 9)  [facility] -Subsequent Annual Wellness Visit [G0439] 10)  Tdap => 67yrs IM [90715] 11)  Admin 1st Vaccine [90471] 12)  Zoster (Shingles) Vaccine Live [29562] 13)  Admin of Any Addtl Vaccine [90472] 14)  Est. Patient Level III [13086]   Immunization History:  Influenza Immunization History:    Influenza:  historical (05/22/2010)  Immunizations Administered:  Tetanus Vaccine:    Vaccine Type: Tdap    Site: right deltoid    Mfr: GlaxoSmithKline    Dose: 0.5 ml    Route: IM    Given by: Rock Nephew CMA    Exp. Date: 05/31/2012    Lot #:  [id number]    VIS given: 06/29/08 version given September 20, 2010.  Zostavax # 1:    Vaccine Type: Zostavax    Site: right deltoid    Mfr: Merck    Dose: 0.65    Route: IM    Given by: Rock Nephew CMA    Exp. Date: 03/30/2011    Lot #: [id number]  VIS given: 05/24/05 given September 20, 2010.   Immunization History:  Influenza Immunization History:    Influenza:  Historical (05/22/2010)  Immunizations Administered:  Tetanus Vaccine:    Vaccine Type: Tdap    Site: right deltoid    Mfr: GlaxoSmithKline    Dose: 0.5 ml    Route: IM    Given by: Rock Nephew CMA    Exp. Date: 05/31/2012    Lot #: [id number]    VIS given: 06/29/08 version given September 20, 2010.  Zostavax # 1:    Vaccine Type: Zostavax    Site: right deltoid    Mfr: Merck    Dose: 0.65    Route: IM    Given by: Rock Nephew CMA    Exp. Date: 03/30/2011    Lot #: [id number]    VIS given: 05/24/05 given September 20, 2010.     Prevention & Chronic Care Immunizations   Influenza vaccine: Historical  (05/22/2010)    Tetanus booster: 09/20/2010: Tdap    Pneumococcal vaccine: Not documented   Pneumococcal vaccine deferral: Refused  (09/20/2010)    H. zoster vaccine: 09/20/2010: Zostavax  Colorectal Screening   Hemoccult: Not documented   Hemoccult action/deferral: patient refused  (09/20/2010)    Colonoscopy: Not documented   Colonoscopy action/deferral: patient defers today but will consider in the future  (09/20/2010)  Other Screening   Pap smear: Normal  (06/21/2008)   Pap smear action/deferral: patient refuses understanding risks of delayed diagnosis  (09/20/2010)    Mammogram: ASSESSMENT: Negative - BI-RADS 1^MM DIGITAL SCREENING  (06/12/2009)   Mammogram action/deferral: mammogram ordered  (09/20/2010)    DXA bone density scan: Not documented   Smoking status: quit  (09/20/2010)  Lipids   Total Cholesterol: 213  (04/26/2009)   LDL: Not documented   LDL Direct: 130.3   (04/26/2009)   HDL: 55.00  (04/26/2009)   Triglycerides: 182.0  (04/26/2009)    SGOT (AST): 15  (04/26/2009)   SGPT (ALT): 16  (04/26/2009)   Alkaline phosphatase: 54  (04/26/2009)   Total bilirubin: 0.9  (04/26/2009)  Hypertension   Last Blood Pressure: 102 / 68  (09/20/2010)   Serum creatinine: 1.2  (04/26/2009)   Serum potassium 4.4  (04/26/2009)  Self-Management Support :    Hypertension self-management support: Not documented    Lipid self-management support: Not documented    Nursing Instructions: Give Herpes zoster vaccine today Give tetanus booster today

## 2010-10-16 ENCOUNTER — Ambulatory Visit
Admission: RE | Admit: 2010-10-16 | Discharge: 2010-10-16 | Disposition: A | Discharge status: Home / Self Care | Payer: BC Managed Care – PPO | Source: Ambulatory Visit | Attending: Internal Medicine | Admitting: Internal Medicine

## 2010-10-16 DIAGNOSIS — Z1231 Encounter for screening mammogram for malignant neoplasm of breast: Secondary | ICD-10-CM

## 2010-10-16 LAB — HM MAMMOGRAPHY: HM Mammogram: NORMAL

## 2010-10-19 ENCOUNTER — Telehealth: Payer: Self-pay | Admitting: Internal Medicine

## 2010-10-23 NOTE — Progress Notes (Signed)
    PAP Screening:    Last PAP smear:  06/21/2008  Mammogram Screening:    Last Mammogram:  10/16/2010  Mammogram Results:    Date of Exam:  10/16/2010    Results:  Normal Bilateral  Osteoporosis Risk Assessment:  Risk Factors for Fracture or Low Bone Density:   Race (White or Asian):     yes   Smoking status:       quit  Immunization & Chemoprophylaxis:    Tetanus vaccine: Tdap  (09/20/2010)    Influenza vaccine: Historical  (05/22/2010)

## 2010-12-20 ENCOUNTER — Encounter: Payer: Self-pay | Admitting: Internal Medicine

## 2010-12-20 ENCOUNTER — Ambulatory Visit (INDEPENDENT_AMBULATORY_CARE_PROVIDER_SITE_OTHER): Payer: Medicare Other | Admitting: Internal Medicine

## 2010-12-20 VITALS — BP 116/72 | HR 60 | Temp 97.9°F | Resp 16 | Wt 172.0 lb

## 2010-12-20 DIAGNOSIS — I10 Essential (primary) hypertension: Secondary | ICD-10-CM

## 2010-12-20 MED ORDER — METOPROLOL TARTRATE 25 MG PO TABS: 25.0000 mg | ORAL_TABLET | Freq: Two times a day (BID) | ORAL | Status: DC

## 2010-12-20 NOTE — Patient Instructions (Signed)

## 2010-12-20 NOTE — Progress Notes (Signed)
Subjective:    Patient ID: Jocelyn [name], female    DOB: [birth year], 77 y.o.   MRN: [id number]  Hypertension This is a chronic problem. The current episode started more than 1 year ago. The problem has been gradually improving since onset. The problem is uncontrolled. Pertinent negatives include no anxiety, blurred vision, chest pain, headaches, malaise/fatigue, neck pain, orthopnea, palpitations, peripheral edema, PND, shortness of breath or sweats. There are no associated agents to hypertension. Past treatments include beta blockers. The current treatment provides significant improvement. There are no compliance problems.       Review of Systems  Constitutional: Negative for fever, chills, malaise/fatigue, diaphoresis, activity change, appetite change, fatigue and unexpected weight change.  HENT: Negative for facial swelling, neck pain and neck stiffness.   Eyes: Negative for blurred vision, photophobia and visual disturbance.  Respiratory: Negative for apnea, cough, choking, chest tightness, shortness of breath, wheezing and stridor.   Cardiovascular: Negative for chest pain, palpitations, orthopnea, leg swelling and PND.  Gastrointestinal: Negative for nausea, vomiting, abdominal pain and diarrhea.  Genitourinary: Negative for dysuria, urgency, frequency, hematuria, flank pain, enuresis, difficulty urinating and dyspareunia.  Musculoskeletal: Negative for myalgias, back pain, joint swelling, arthralgias and gait problem.  Skin: Negative for color change, pallor and rash.  Neurological: Negative for dizziness, tremors, seizures, syncope, facial asymmetry, speech difficulty, weakness, light-headedness, numbness and headaches.  Hematological: Negative for adenopathy. Does not bruise/bleed easily.  Psychiatric/Behavioral: Negative.        Objective:   Physical Exam  [vitalsreviewed. Constitutional: She is oriented to person, place, and time. She appears well-developed and well-nourished.  No distress.  HENT:  Head: Normocephalic and atraumatic.  Right Ear: External ear normal.  Left Ear: External ear normal.  Nose: Nose normal.  Mouth/Throat: Oropharynx is clear and moist. No oropharyngeal exudate.  Eyes: Conjunctivae and EOM are normal. Pupils are equal, round, and reactive to light. Right eye exhibits no discharge. Left eye exhibits no discharge. No scleral icterus.  Neck: Normal range of motion. Neck supple. No JVD present. No tracheal deviation present. No thyromegaly present.  Cardiovascular: Normal rate, regular rhythm, normal heart sounds and intact distal pulses.  Exam reveals no gallop and no friction rub.   No murmur heard. Pulmonary/Chest: Effort normal and breath sounds normal. No stridor. No respiratory distress. She has no wheezes. She has no rales. She exhibits no tenderness.  Abdominal: Soft. Bowel sounds are normal. She exhibits no distension and no mass. There is no tenderness. There is no rebound and no guarding.  Musculoskeletal: Normal range of motion. She exhibits no edema and no tenderness.  Lymphadenopathy:    She has no cervical adenopathy.  Neurological: She is alert and oriented to person, place, and time. She has normal reflexes. She displays normal reflexes. No cranial nerve deficit. She exhibits normal muscle tone. Coordination normal.  Skin: Skin is warm and dry. No rash noted. She is not diaphoretic. No erythema. No pallor.  Psychiatric: She has a normal mood and affect. Her behavior is normal. Judgment and thought content normal.        Lab Results  Component Value Date   WBC 9.4 09/20/2010   HGB 15.0 09/20/2010   HCT 43.0 09/20/2010   PLT 149.0* 09/20/2010   CHOL 213* 09/20/2010   TRIG 191.0* 09/20/2010   HDL 55.80 09/20/2010   LDLDIRECT 132.6 09/20/2010   ALT 14 09/20/2010   AST 13 09/20/2010   NA 135 09/20/2010   K 4.6 09/20/2010   CL 102 09/20/2010  CREATININE 1.0 09/20/2010   BUN 16 09/20/2010   CO2 26 09/20/2010   TSH 1.26 09/20/2010    Assessment &  Plan:

## 2010-12-20 NOTE — Assessment & Plan Note (Signed)
Her BP is well controlled and she has no side effects, will continue the same for now

## 2011-04-08 ENCOUNTER — Other Ambulatory Visit (INDEPENDENT_AMBULATORY_CARE_PROVIDER_SITE_OTHER): Payer: Medicare Other

## 2011-04-08 ENCOUNTER — Other Ambulatory Visit: Payer: Self-pay | Admitting: Internal Medicine

## 2011-04-08 ENCOUNTER — Encounter: Payer: Self-pay | Admitting: Internal Medicine

## 2011-04-08 ENCOUNTER — Ambulatory Visit (INDEPENDENT_AMBULATORY_CARE_PROVIDER_SITE_OTHER): Payer: Medicare Other | Admitting: Internal Medicine

## 2011-04-08 DIAGNOSIS — E785 Hyperlipidemia, unspecified: Secondary | ICD-10-CM

## 2011-04-08 DIAGNOSIS — I10 Essential (primary) hypertension: Secondary | ICD-10-CM

## 2011-04-08 LAB — URINALYSIS, ROUTINE W REFLEX MICROSCOPIC
Bilirubin Urine: NEGATIVE
Hgb urine dipstick: NEGATIVE
Ketones, ur: NEGATIVE
Leukocytes, UA: NEGATIVE
Nitrite: NEGATIVE
Specific Gravity, Urine: 1.015 (ref 1.000–1.030)
Total Protein, Urine: NEGATIVE
Urine Glucose: NEGATIVE
Urobilinogen, UA: 0.2 (ref 0.0–1.0)
pH: 6 (ref 5.0–8.0)

## 2011-04-08 LAB — CBC WITH DIFFERENTIAL/PLATELET
Basophils Absolute: 0.1 10*3/uL (ref 0.0–0.1)
Basophils Relative: 0.6 % (ref 0.0–3.0)
Eosinophils Absolute: 0.5 10*3/uL (ref 0.0–0.7)
Eosinophils Relative: 4.7 % (ref 0.0–5.0)
HCT: 45.3 % (ref 36.0–46.0)
Hemoglobin: 15.6 g/dL — ABNORMAL HIGH (ref 12.0–15.0)
Lymphocytes Relative: 29.5 % (ref 12.0–46.0)
Lymphs Abs: 3.1 10*3/uL (ref 0.7–4.0)
MCHC: 34.5 g/dL (ref 30.0–36.0)
MCV: 91.7 fl (ref 78.0–100.0)
Monocytes Absolute: 0.9 10*3/uL (ref 0.1–1.0)
Monocytes Relative: 8.5 % (ref 3.0–12.0)
Neutro Abs: 6 10*3/uL (ref 1.4–7.7)
Neutrophils Relative %: 56.7 % (ref 43.0–77.0)
Platelets: 175 10*3/uL (ref 150.0–400.0)
RBC: 4.95 Mil/uL (ref 3.87–5.11)
RDW: 13.4 % (ref 11.5–14.6)
WBC: 10.6 10*3/uL — ABNORMAL HIGH (ref 4.5–10.5)

## 2011-04-08 LAB — LIPID PANEL
Cholesterol: 212 mg/dL — ABNORMAL HIGH (ref 0–200)
HDL: 60.8 mg/dL (ref >39.00)
Total CHOL/HDL Ratio: 3
Triglycerides: 215 mg/dL — ABNORMAL HIGH (ref 0.0–149.0)
VLDL: 43 mg/dL — ABNORMAL HIGH (ref 0.0–40.0)

## 2011-04-08 LAB — COMPREHENSIVE METABOLIC PANEL
ALT: 14 U/L (ref 0–35)
AST: 17 U/L (ref 0–37)
Albumin: 4.2 g/dL (ref 3.5–5.2)
Alkaline Phosphatase: 58 U/L (ref 39–117)
BUN: 16 mg/dL (ref 6–23)
CO2: 27 mEq/L (ref 19–32)
Calcium: 10 mg/dL (ref 8.4–10.5)
Chloride: 105 mEq/L (ref 96–112)
Creatinine, Ser: 1.2 mg/dL (ref 0.4–1.2)
GFR: 44.61 mL/min — ABNORMAL LOW (ref >60.00)
Glucose, Bld: 107 mg/dL — ABNORMAL HIGH (ref 70–99)
Potassium: 4.4 mEq/L (ref 3.5–5.1)
Sodium: 141 mEq/L (ref 135–145)
Total Bilirubin: 0.6 mg/dL (ref 0.3–1.2)
Total Protein: 7.2 g/dL (ref 6.0–8.3)

## 2011-04-08 LAB — LDL CHOLESTEROL, DIRECT: Direct LDL: 128.9 mg/dL

## 2011-04-08 LAB — TSH: TSH: 0.53 u[IU]/mL (ref 0.35–5.50)

## 2011-04-08 MED ORDER — OLMESARTAN MEDOXOMIL 40 MG PO TABS: 40.0000 mg | ORAL_TABLET | Freq: Every day | ORAL | Status: DC

## 2011-04-08 NOTE — Assessment & Plan Note (Signed)
Will check FLP today.   

## 2011-04-08 NOTE — Progress Notes (Signed)
Subjective:    Patient ID: Jocelyn [name], female    DOB: 1932/04/20, 77 y.o.   MRN: [id number]  Hypertension This is a chronic problem. The current episode started more than 1 year ago. The problem has been gradually worsening since onset. The problem is uncontrolled. Pertinent negatives include no anxiety, blurred vision, chest pain, headaches, malaise/fatigue, neck pain, orthopnea, palpitations, peripheral edema, PND, shortness of breath or sweats. Past treatments include beta blockers. The current treatment provides mild improvement. Compliance problems include exercise and diet.       Review of Systems  Constitutional: Negative for fever, chills, malaise/fatigue, diaphoresis, activity change, appetite change, fatigue and unexpected weight change.  HENT: Negative for sore throat, facial swelling, trouble swallowing, neck pain and voice change.   Eyes: Negative.  Negative for blurred vision.  Respiratory: Negative for apnea, cough, choking, chest tightness, shortness of breath, wheezing and stridor.   Cardiovascular: Negative for chest pain, palpitations, orthopnea, leg swelling and PND.  Gastrointestinal: Negative for nausea, vomiting, abdominal pain, diarrhea, constipation, blood in stool, abdominal distention and anal bleeding.  Genitourinary: Negative for dysuria, urgency, frequency, hematuria, flank pain, decreased urine volume, enuresis, difficulty urinating and dyspareunia.  Musculoskeletal: Negative for myalgias, back pain, joint swelling, arthralgias and gait problem.  Skin: Negative for color change, pallor, rash and wound.  Neurological: Positive for dizziness. Negative for tremors, seizures, syncope, facial asymmetry, speech difficulty, weakness, light-headedness, numbness and headaches.  Hematological: Negative for adenopathy. Does not bruise/bleed easily.  Psychiatric/Behavioral: Negative.        Objective:   Physical Exam  Vitals reviewed. Constitutional: She is oriented  to person, place, and time. She appears well-developed and well-nourished. No distress.  HENT:  Head: Normocephalic and atraumatic.  Mouth/Throat: No oropharyngeal exudate.  Eyes: Conjunctivae are normal. Right eye exhibits no discharge. Left eye exhibits no discharge. No scleral icterus.  Neck: Normal range of motion. Neck supple. No JVD present. No tracheal deviation present. No thyromegaly present.  Cardiovascular: Normal rate, regular rhythm, normal heart sounds and intact distal pulses.  Exam reveals no gallop and no friction rub.   No murmur heard. Pulmonary/Chest: Effort normal and breath sounds normal. No stridor. No respiratory distress. She has no wheezes. She has no rales. She exhibits no tenderness.  Abdominal: Soft. Bowel sounds are normal. She exhibits no distension and no mass. There is no tenderness. There is no rebound and no guarding.  Musculoskeletal: Normal range of motion. She exhibits no edema and no tenderness.  Lymphadenopathy:    She has no cervical adenopathy.  Neurological: She is alert and oriented to person, place, and time. She has normal reflexes. She displays normal reflexes. She exhibits normal muscle tone.  Skin: Skin is warm and dry. No rash noted. She is not diaphoretic. No erythema. No pallor.  Psychiatric: She has a normal mood and affect. Her behavior is normal. Judgment and thought content normal.      Lab Results  Component Value Date   WBC 9.4 09/20/2010   HGB 15.0 09/20/2010   HCT 43.0 09/20/2010   PLT 149.0* 09/20/2010   CHOL 213* 09/20/2010   TRIG 191.0* 09/20/2010   HDL 55.80 09/20/2010   LDLDIRECT 132.6 09/20/2010   ALT 14 09/20/2010   AST 13 09/20/2010   NA 135 09/20/2010   K 4.6 09/20/2010   CL 102 09/20/2010   CREATININE 1.0 09/20/2010   BUN 16 09/20/2010   CO2 26 09/20/2010   TSH 1.26 09/20/2010      Assessment & Plan:

## 2011-04-08 NOTE — Patient Instructions (Signed)

## 2011-04-08 NOTE — Progress Notes (Signed)
Addended by: Etta Grandchild on: 04/08/2011 04:08 PM   Modules accepted: Orders

## 2011-04-08 NOTE — Assessment & Plan Note (Signed)
I will add Benicar to metoprolol and today I will check her labs to look for other causes

## 2011-04-10 ENCOUNTER — Encounter: Payer: Self-pay | Admitting: Internal Medicine

## 2011-06-21 ENCOUNTER — Other Ambulatory Visit: Payer: Self-pay | Admitting: Internal Medicine

## 2011-06-21 DIAGNOSIS — Z1231 Encounter for screening mammogram for malignant neoplasm of breast: Secondary | ICD-10-CM

## 2011-07-16 ENCOUNTER — Ambulatory Visit: Payer: Medicare Other

## 2011-11-20 ENCOUNTER — Other Ambulatory Visit: Payer: Self-pay | Admitting: Internal Medicine

## 2011-12-03 DIAGNOSIS — IMO0002 Reserved for concepts with insufficient information to code with codable children: Secondary | ICD-10-CM | POA: Diagnosis not present

## 2011-12-03 DIAGNOSIS — M999 Biomechanical lesion, unspecified: Secondary | ICD-10-CM | POA: Diagnosis not present

## 2011-12-03 DIAGNOSIS — M461 Sacroiliitis, not elsewhere classified: Secondary | ICD-10-CM | POA: Diagnosis not present

## 2011-12-04 DIAGNOSIS — M461 Sacroiliitis, not elsewhere classified: Secondary | ICD-10-CM | POA: Diagnosis not present

## 2011-12-04 DIAGNOSIS — IMO0002 Reserved for concepts with insufficient information to code with codable children: Secondary | ICD-10-CM | POA: Diagnosis not present

## 2011-12-04 DIAGNOSIS — M999 Biomechanical lesion, unspecified: Secondary | ICD-10-CM | POA: Diagnosis not present

## 2011-12-05 DIAGNOSIS — M999 Biomechanical lesion, unspecified: Secondary | ICD-10-CM | POA: Diagnosis not present

## 2011-12-05 DIAGNOSIS — M461 Sacroiliitis, not elsewhere classified: Secondary | ICD-10-CM | POA: Diagnosis not present

## 2011-12-05 DIAGNOSIS — IMO0002 Reserved for concepts with insufficient information to code with codable children: Secondary | ICD-10-CM | POA: Diagnosis not present

## 2011-12-09 DIAGNOSIS — M461 Sacroiliitis, not elsewhere classified: Secondary | ICD-10-CM | POA: Diagnosis not present

## 2011-12-09 DIAGNOSIS — M999 Biomechanical lesion, unspecified: Secondary | ICD-10-CM | POA: Diagnosis not present

## 2011-12-09 DIAGNOSIS — IMO0002 Reserved for concepts with insufficient information to code with codable children: Secondary | ICD-10-CM | POA: Diagnosis not present

## 2011-12-10 DIAGNOSIS — M461 Sacroiliitis, not elsewhere classified: Secondary | ICD-10-CM | POA: Diagnosis not present

## 2011-12-10 DIAGNOSIS — IMO0002 Reserved for concepts with insufficient information to code with codable children: Secondary | ICD-10-CM | POA: Diagnosis not present

## 2011-12-10 DIAGNOSIS — M999 Biomechanical lesion, unspecified: Secondary | ICD-10-CM | POA: Diagnosis not present

## 2011-12-12 DIAGNOSIS — IMO0002 Reserved for concepts with insufficient information to code with codable children: Secondary | ICD-10-CM | POA: Diagnosis not present

## 2011-12-12 DIAGNOSIS — M999 Biomechanical lesion, unspecified: Secondary | ICD-10-CM | POA: Diagnosis not present

## 2011-12-12 DIAGNOSIS — M461 Sacroiliitis, not elsewhere classified: Secondary | ICD-10-CM | POA: Diagnosis not present

## 2011-12-17 DIAGNOSIS — M461 Sacroiliitis, not elsewhere classified: Secondary | ICD-10-CM | POA: Diagnosis not present

## 2011-12-17 DIAGNOSIS — M999 Biomechanical lesion, unspecified: Secondary | ICD-10-CM | POA: Diagnosis not present

## 2011-12-17 DIAGNOSIS — IMO0002 Reserved for concepts with insufficient information to code with codable children: Secondary | ICD-10-CM | POA: Diagnosis not present

## 2011-12-18 DIAGNOSIS — M999 Biomechanical lesion, unspecified: Secondary | ICD-10-CM | POA: Diagnosis not present

## 2011-12-18 DIAGNOSIS — M461 Sacroiliitis, not elsewhere classified: Secondary | ICD-10-CM | POA: Diagnosis not present

## 2011-12-18 DIAGNOSIS — IMO0002 Reserved for concepts with insufficient information to code with codable children: Secondary | ICD-10-CM | POA: Diagnosis not present

## 2011-12-19 DIAGNOSIS — M461 Sacroiliitis, not elsewhere classified: Secondary | ICD-10-CM | POA: Diagnosis not present

## 2011-12-19 DIAGNOSIS — M999 Biomechanical lesion, unspecified: Secondary | ICD-10-CM | POA: Diagnosis not present

## 2011-12-19 DIAGNOSIS — IMO0002 Reserved for concepts with insufficient information to code with codable children: Secondary | ICD-10-CM | POA: Diagnosis not present

## 2011-12-23 DIAGNOSIS — IMO0002 Reserved for concepts with insufficient information to code with codable children: Secondary | ICD-10-CM | POA: Diagnosis not present

## 2011-12-23 DIAGNOSIS — M999 Biomechanical lesion, unspecified: Secondary | ICD-10-CM | POA: Diagnosis not present

## 2011-12-23 DIAGNOSIS — M461 Sacroiliitis, not elsewhere classified: Secondary | ICD-10-CM | POA: Diagnosis not present

## 2011-12-25 DIAGNOSIS — IMO0002 Reserved for concepts with insufficient information to code with codable children: Secondary | ICD-10-CM | POA: Diagnosis not present

## 2011-12-25 DIAGNOSIS — M999 Biomechanical lesion, unspecified: Secondary | ICD-10-CM | POA: Diagnosis not present

## 2011-12-25 DIAGNOSIS — M461 Sacroiliitis, not elsewhere classified: Secondary | ICD-10-CM | POA: Diagnosis not present

## 2011-12-26 DIAGNOSIS — IMO0002 Reserved for concepts with insufficient information to code with codable children: Secondary | ICD-10-CM | POA: Diagnosis not present

## 2011-12-26 DIAGNOSIS — M461 Sacroiliitis, not elsewhere classified: Secondary | ICD-10-CM | POA: Diagnosis not present

## 2011-12-26 DIAGNOSIS — M999 Biomechanical lesion, unspecified: Secondary | ICD-10-CM | POA: Diagnosis not present

## 2011-12-30 DIAGNOSIS — M999 Biomechanical lesion, unspecified: Secondary | ICD-10-CM | POA: Diagnosis not present

## 2011-12-30 DIAGNOSIS — IMO0002 Reserved for concepts with insufficient information to code with codable children: Secondary | ICD-10-CM | POA: Diagnosis not present

## 2011-12-30 DIAGNOSIS — M461 Sacroiliitis, not elsewhere classified: Secondary | ICD-10-CM | POA: Diagnosis not present

## 2012-01-01 DIAGNOSIS — M461 Sacroiliitis, not elsewhere classified: Secondary | ICD-10-CM | POA: Diagnosis not present

## 2012-01-01 DIAGNOSIS — M999 Biomechanical lesion, unspecified: Secondary | ICD-10-CM | POA: Diagnosis not present

## 2012-01-01 DIAGNOSIS — IMO0002 Reserved for concepts with insufficient information to code with codable children: Secondary | ICD-10-CM | POA: Diagnosis not present

## 2012-01-08 DIAGNOSIS — IMO0002 Reserved for concepts with insufficient information to code with codable children: Secondary | ICD-10-CM | POA: Diagnosis not present

## 2012-01-08 DIAGNOSIS — M461 Sacroiliitis, not elsewhere classified: Secondary | ICD-10-CM | POA: Diagnosis not present

## 2012-01-08 DIAGNOSIS — M999 Biomechanical lesion, unspecified: Secondary | ICD-10-CM | POA: Diagnosis not present

## 2012-01-09 DIAGNOSIS — M461 Sacroiliitis, not elsewhere classified: Secondary | ICD-10-CM | POA: Diagnosis not present

## 2012-01-09 DIAGNOSIS — IMO0002 Reserved for concepts with insufficient information to code with codable children: Secondary | ICD-10-CM | POA: Diagnosis not present

## 2012-01-09 DIAGNOSIS — M999 Biomechanical lesion, unspecified: Secondary | ICD-10-CM | POA: Diagnosis not present

## 2012-01-13 DIAGNOSIS — M461 Sacroiliitis, not elsewhere classified: Secondary | ICD-10-CM | POA: Diagnosis not present

## 2012-01-13 DIAGNOSIS — M999 Biomechanical lesion, unspecified: Secondary | ICD-10-CM | POA: Diagnosis not present

## 2012-01-13 DIAGNOSIS — IMO0002 Reserved for concepts with insufficient information to code with codable children: Secondary | ICD-10-CM | POA: Diagnosis not present

## 2012-01-15 DIAGNOSIS — M461 Sacroiliitis, not elsewhere classified: Secondary | ICD-10-CM | POA: Diagnosis not present

## 2012-01-15 DIAGNOSIS — IMO0002 Reserved for concepts with insufficient information to code with codable children: Secondary | ICD-10-CM | POA: Diagnosis not present

## 2012-01-15 DIAGNOSIS — M999 Biomechanical lesion, unspecified: Secondary | ICD-10-CM | POA: Diagnosis not present

## 2012-01-21 DIAGNOSIS — IMO0002 Reserved for concepts with insufficient information to code with codable children: Secondary | ICD-10-CM | POA: Diagnosis not present

## 2012-01-21 DIAGNOSIS — M461 Sacroiliitis, not elsewhere classified: Secondary | ICD-10-CM | POA: Diagnosis not present

## 2012-01-21 DIAGNOSIS — M999 Biomechanical lesion, unspecified: Secondary | ICD-10-CM | POA: Diagnosis not present

## 2012-01-29 DIAGNOSIS — IMO0002 Reserved for concepts with insufficient information to code with codable children: Secondary | ICD-10-CM | POA: Diagnosis not present

## 2012-01-29 DIAGNOSIS — M999 Biomechanical lesion, unspecified: Secondary | ICD-10-CM | POA: Diagnosis not present

## 2012-01-29 DIAGNOSIS — M461 Sacroiliitis, not elsewhere classified: Secondary | ICD-10-CM | POA: Diagnosis not present

## 2012-02-12 DIAGNOSIS — M999 Biomechanical lesion, unspecified: Secondary | ICD-10-CM | POA: Diagnosis not present

## 2012-02-12 DIAGNOSIS — IMO0002 Reserved for concepts with insufficient information to code with codable children: Secondary | ICD-10-CM | POA: Diagnosis not present

## 2012-02-12 DIAGNOSIS — M461 Sacroiliitis, not elsewhere classified: Secondary | ICD-10-CM | POA: Diagnosis not present

## 2012-03-02 DIAGNOSIS — M999 Biomechanical lesion, unspecified: Secondary | ICD-10-CM | POA: Diagnosis not present

## 2012-03-02 DIAGNOSIS — M461 Sacroiliitis, not elsewhere classified: Secondary | ICD-10-CM | POA: Diagnosis not present

## 2012-03-02 DIAGNOSIS — IMO0002 Reserved for concepts with insufficient information to code with codable children: Secondary | ICD-10-CM | POA: Diagnosis not present

## 2012-03-26 DIAGNOSIS — IMO0002 Reserved for concepts with insufficient information to code with codable children: Secondary | ICD-10-CM | POA: Diagnosis not present

## 2012-03-26 DIAGNOSIS — M999 Biomechanical lesion, unspecified: Secondary | ICD-10-CM | POA: Diagnosis not present

## 2012-03-26 DIAGNOSIS — M461 Sacroiliitis, not elsewhere classified: Secondary | ICD-10-CM | POA: Diagnosis not present

## 2012-04-16 DIAGNOSIS — IMO0002 Reserved for concepts with insufficient information to code with codable children: Secondary | ICD-10-CM | POA: Diagnosis not present

## 2012-04-16 DIAGNOSIS — M999 Biomechanical lesion, unspecified: Secondary | ICD-10-CM | POA: Diagnosis not present

## 2012-04-16 DIAGNOSIS — M461 Sacroiliitis, not elsewhere classified: Secondary | ICD-10-CM | POA: Diagnosis not present

## 2012-06-12 DIAGNOSIS — Z23 Encounter for immunization: Secondary | ICD-10-CM | POA: Diagnosis not present

## 2012-07-16 ENCOUNTER — Ambulatory Visit (INDEPENDENT_AMBULATORY_CARE_PROVIDER_SITE_OTHER): Payer: Medicare Other

## 2012-07-16 DIAGNOSIS — Z1231 Encounter for screening mammogram for malignant neoplasm of breast: Secondary | ICD-10-CM

## 2012-07-17 LAB — HM MAMMOGRAPHY: HM Mammogram: NORMAL

## 2013-05-13 DIAGNOSIS — Z23 Encounter for immunization: Secondary | ICD-10-CM | POA: Diagnosis not present

## 2013-05-18 ENCOUNTER — Other Ambulatory Visit: Payer: Self-pay | Admitting: Internal Medicine

## 2013-05-18 ENCOUNTER — Other Ambulatory Visit: Payer: Self-pay | Admitting: *Deleted

## 2013-05-18 MED ORDER — METOPROLOL TARTRATE 25 MG PO TABS: ORAL_TABLET | ORAL | Status: DC

## 2013-06-09 ENCOUNTER — Other Ambulatory Visit (INDEPENDENT_AMBULATORY_CARE_PROVIDER_SITE_OTHER): Payer: Medicare Other

## 2013-06-09 ENCOUNTER — Encounter: Payer: Self-pay | Admitting: Internal Medicine

## 2013-06-09 ENCOUNTER — Ambulatory Visit (INDEPENDENT_AMBULATORY_CARE_PROVIDER_SITE_OTHER): Payer: Medicare Other | Admitting: Internal Medicine

## 2013-06-09 VITALS — BP 114/68 | HR 50 | Temp 97.7°F | Resp 16 | Ht 66.0 in | Wt 176.0 lb

## 2013-06-09 DIAGNOSIS — Z23 Encounter for immunization: Secondary | ICD-10-CM | POA: Diagnosis not present

## 2013-06-09 DIAGNOSIS — I10 Essential (primary) hypertension: Secondary | ICD-10-CM

## 2013-06-09 DIAGNOSIS — F32A Depression, unspecified: Secondary | ICD-10-CM | POA: Insufficient documentation

## 2013-06-09 DIAGNOSIS — E785 Hyperlipidemia, unspecified: Secondary | ICD-10-CM

## 2013-06-09 DIAGNOSIS — R7309 Other abnormal glucose: Secondary | ICD-10-CM | POA: Diagnosis not present

## 2013-06-09 DIAGNOSIS — F411 Generalized anxiety disorder: Secondary | ICD-10-CM

## 2013-06-09 DIAGNOSIS — Z0000 Encounter for general adult medical examination without abnormal findings: Secondary | ICD-10-CM

## 2013-06-09 DIAGNOSIS — F418 Other specified anxiety disorders: Secondary | ICD-10-CM | POA: Insufficient documentation

## 2013-06-09 LAB — TSH: TSH: 0.99 u[IU]/mL (ref 0.35–5.50)

## 2013-06-09 LAB — CBC WITH DIFFERENTIAL/PLATELET
Basophils Absolute: 0.1 10*3/uL (ref 0.0–0.1)
Basophils Relative: 1.1 % (ref 0.0–3.0)
Eosinophils Absolute: 0.5 10*3/uL (ref 0.0–0.7)
Eosinophils Relative: 5.5 % — ABNORMAL HIGH (ref 0.0–5.0)
HCT: 43.2 % (ref 36.0–46.0)
Hemoglobin: 14.9 g/dL (ref 12.0–15.0)
Lymphocytes Relative: 28.3 % (ref 12.0–46.0)
Lymphs Abs: 2.4 10*3/uL (ref 0.7–4.0)
MCHC: 34.4 g/dL (ref 30.0–36.0)
MCV: 88.6 fl (ref 78.0–100.0)
Monocytes Absolute: 0.9 10*3/uL (ref 0.1–1.0)
Monocytes Relative: 10.4 % (ref 3.0–12.0)
Neutro Abs: 4.6 10*3/uL (ref 1.4–7.7)
Neutrophils Relative %: 54.7 % (ref 43.0–77.0)
Platelets: 66 10*3/uL — ABNORMAL LOW (ref 150.0–400.0)
RBC: 4.87 Mil/uL (ref 3.87–5.11)
RDW: 12.9 % (ref 11.5–14.6)
WBC: 8.4 10*3/uL (ref 4.5–10.5)

## 2013-06-09 LAB — COMPREHENSIVE METABOLIC PANEL
ALT: 15 U/L (ref 0–35)
AST: 17 U/L (ref 0–37)
Albumin: 4 g/dL (ref 3.5–5.2)
Alkaline Phosphatase: 47 U/L (ref 39–117)
BUN: 15 mg/dL (ref 6–23)
CO2: 28 mEq/L (ref 19–32)
Calcium: 10.5 mg/dL (ref 8.4–10.5)
Chloride: 104 mEq/L (ref 96–112)
Creatinine, Ser: 1.1 mg/dL (ref 0.4–1.2)
GFR: 52.02 mL/min — ABNORMAL LOW (ref >60.00)
Glucose, Bld: 92 mg/dL (ref 70–99)
Potassium: 4.8 mEq/L (ref 3.5–5.1)
Sodium: 139 mEq/L (ref 135–145)
Total Bilirubin: 0.8 mg/dL (ref 0.3–1.2)
Total Protein: 6.9 g/dL (ref 6.0–8.3)

## 2013-06-09 LAB — LIPID PANEL
Cholesterol: 188 mg/dL (ref 0–200)
HDL: 53.9 mg/dL (ref >39.00)
LDL Cholesterol: 106 mg/dL — ABNORMAL HIGH (ref 0–99)
Total CHOL/HDL Ratio: 3
Triglycerides: 139 mg/dL (ref 0.0–149.0)
VLDL: 27.8 mg/dL (ref 0.0–40.0)

## 2013-06-09 LAB — HEMOGLOBIN A1C: Hgb A1c MFr Bld: 5.5 % (ref 4.6–6.5)

## 2013-06-09 LAB — HM DEXA SCAN

## 2013-06-09 MED ORDER — METOPROLOL TARTRATE 25 MG PO TABS: ORAL_TABLET | ORAL | Status: DC

## 2013-06-09 MED ORDER — LORAZEPAM 0.5 MG PO TABS: 0.5000 mg | ORAL_TABLET | Freq: Two times a day (BID) | ORAL | Status: DC | PRN

## 2013-06-09 NOTE — Progress Notes (Signed)
Subjective:    Patient ID: Jocelyn [name], female    DOB: 09/27/1931, 79 y.o.   MRN: [id number]  Hypertension This is a chronic problem. The current episode started more than 1 year ago. The problem is unchanged. The problem is controlled. Associated symptoms include anxiety. Pertinent negatives include no blurred vision, chest pain, headaches, malaise/fatigue, neck pain, orthopnea, palpitations, peripheral edema, PND, shortness of breath or sweats. Past treatments include beta blockers. The current treatment provides significant improvement. Compliance problems include exercise and diet.       Review of Systems  Constitutional: Positive for unexpected weight change (some weight gain). Negative for fever, chills, malaise/fatigue, diaphoresis, activity change, appetite change and fatigue.  HENT: Negative.   Eyes: Negative.  Negative for blurred vision.  Respiratory: Negative.  Negative for cough, choking, chest tightness, shortness of breath, wheezing and stridor.   Cardiovascular: Negative.  Negative for chest pain, palpitations, orthopnea, leg swelling and PND.  Gastrointestinal: Negative.  Negative for vomiting, abdominal pain, diarrhea, constipation and blood in stool.  Endocrine: Negative.   Genitourinary: Negative.   Musculoskeletal: Negative.  Negative for arthralgias, back pain, gait problem, joint swelling, myalgias, neck pain and neck stiffness.  Skin: Negative.   Allergic/Immunologic: Negative.   Neurological: Negative.  Negative for dizziness, tremors, speech difficulty, weakness, light-headedness, numbness and headaches.  Hematological: Negative.  Negative for adenopathy. Does not bruise/bleed easily.  Psychiatric/Behavioral: Negative.        Objective:   Physical Exam  Vitals reviewed. Constitutional: She is oriented to person, place, and time. She appears well-developed and well-nourished. No distress.  HENT:  Head: Normocephalic and atraumatic.  Mouth/Throat: Oropharynx  is clear and moist. No oropharyngeal exudate.  Eyes: Conjunctivae are normal. Right eye exhibits no discharge. Left eye exhibits no discharge. No scleral icterus.  Neck: Normal range of motion. Neck supple. No JVD present. No tracheal deviation present. No thyromegaly present.  Cardiovascular: Normal rate, regular rhythm, normal heart sounds and intact distal pulses.  Exam reveals no gallop and no friction rub.   No murmur heard. Pulmonary/Chest: Effort normal and breath sounds normal. No stridor. No respiratory distress. She has no wheezes. She has no rales. She exhibits no tenderness.  Abdominal: Soft. Bowel sounds are normal. She exhibits no distension and no mass. There is no tenderness. There is no rebound and no guarding.  Musculoskeletal: Normal range of motion. She exhibits no edema and no tenderness.  Lymphadenopathy:    She has no cervical adenopathy.  Neurological: She is oriented to person, place, and time.  Skin: Skin is warm and dry. No rash noted. She is not diaphoretic. No erythema. No pallor.  Psychiatric: Her behavior is normal. Judgment and thought content normal. Her mood appears anxious. Her affect is not angry, not blunt, not labile and not inappropriate. Her speech is not rapid and/or pressured, not delayed, not tangential and not slurred. She is not slowed and not withdrawn. Cognition and memory are normal. She does not exhibit a depressed mood. She is communicative. She is attentive.     Lab Results  Component Value Date   WBC 10.6* 04/08/2011   HGB 15.6* 04/08/2011   HCT 45.3 04/08/2011   PLT 175.0 04/08/2011   GLUCOSE 107* 04/08/2011   CHOL 212* 04/08/2011   TRIG 215.0* 04/08/2011   HDL 60.80 04/08/2011   LDLDIRECT 128.9 04/08/2011   ALT 14 04/08/2011   AST 17 04/08/2011   NA 141 04/08/2011   K 4.4 04/08/2011   CL 105 04/08/2011  CREATININE 1.2 04/08/2011   BUN 16 04/08/2011   CO2 27 04/08/2011   TSH 0.53 04/08/2011       Assessment & Plan:

## 2013-06-09 NOTE — Assessment & Plan Note (Signed)
She does not want to start a statin yet Today I will recheck her FLP TSH and CMP

## 2013-06-09 NOTE — Assessment & Plan Note (Signed)
I will check her A1C to see if she has developed DM2 

## 2013-06-09 NOTE — Assessment & Plan Note (Signed)
She has a challenging situation with her husband and occasionally feels overwhelmed She has tried a friend's ativan and that helps quite a bit, for now she will continue that

## 2013-06-09 NOTE — Addendum Note (Signed)
Addended by: Rock Nephew T on: 06/09/2013 01:42 PM   Modules accepted: Orders

## 2013-06-09 NOTE — Patient Instructions (Signed)
Hypertension As your heart beats, it forces blood through your arteries. This force is your blood pressure. If the pressure is too high, it is called hypertension (HTN) or high blood pressure. HTN is dangerous because you may have it and not know it. High blood pressure may mean that your heart has to work harder to pump blood. Your arteries may be narrow or stiff. The extra work puts you at risk for heart disease, stroke, and other problems.  Blood pressure consists of two numbers, a higher number over a lower, 110/72, for example. It is stated as "110 over 72." The ideal is below 120 for the top number (systolic) and under 80 for the bottom (diastolic). Write down your blood pressure today. You should pay close attention to your blood pressure if you have certain conditions such as:  Heart failure.  Prior heart attack.  Diabetes  Chronic kidney disease.  Prior stroke.  Multiple risk factors for heart disease. To see if you have HTN, your blood pressure should be measured while you are seated with your arm held at the level of the heart. It should be measured at least twice. A one-time elevated blood pressure reading (especially in the Emergency Department) does not mean that you need treatment. There may be conditions in which the blood pressure is different between your right and left arms. It is important to see your caregiver soon for a recheck. Most people have essential hypertension which means that there is not a specific cause. This type of high blood pressure may be lowered by changing lifestyle factors such as:  Stress.  Smoking.  Lack of exercise.  Excessive weight.  Drug/tobacco/alcohol use.  Eating less salt. Most people do not have symptoms from high blood pressure until it has caused damage to the body. Effective treatment can often prevent, delay or reduce that damage. TREATMENT  When a cause has been identified, treatment for high blood pressure is directed at the  cause. There are a large number of medications to treat HTN. These fall into several categories, and your caregiver will help you select the medicines that are best for you. Medications may have side effects. You should review side effects with your caregiver. If your blood pressure stays high after you have made lifestyle changes or started on medicines,   Your medication(s) may need to be changed.  Other problems may need to be addressed.  Be certain you understand your prescriptions, and know how and when to take your medicine.  Be sure to follow up with your caregiver within the time frame advised (usually within two weeks) to have your blood pressure rechecked and to review your medications.  If you are taking more than one medicine to lower your blood pressure, make sure you know how and at what times they should be taken. Taking two medicines at the same time can result in blood pressure that is too low. SEEK IMMEDIATE MEDICAL CARE IF:  You develop a severe headache, blurred or changing vision, or confusion.  You have unusual weakness or numbness, or a faint feeling.  You have severe chest or abdominal pain, vomiting, or breathing problems. MAKE SURE YOU:   Understand these instructions.  Will watch your condition.  Will get help right away if you are not doing well or get worse. Document Released: 07/29/2005 Document Revised: 10/21/2011 Document Reviewed: 03/18/2008 [employer] Patient Information 2014 [employer]. Preventive Care for Adults, Female A healthy lifestyle and preventive care can promote health and wellness.   Preventive health guidelines for women include the following key practices.  A routine yearly physical is a good way to check with your caregiver about your health and preventive screening. It is a chance to share any concerns and updates on your health, and to receive a thorough exam.  Visit your dentist for a routine exam and preventive care every 6  months. Brush your teeth twice a day and floss once a day. Good oral hygiene prevents tooth decay and gum disease.  The frequency of eye exams is based on your age, health, family medical history, use of contact lenses, and other factors. Follow your caregiver's recommendations for frequency of eye exams.  Eat a healthy diet. Foods like vegetables, fruits, whole grains, low-fat dairy products, and lean protein foods contain the nutrients you need without too many calories. Decrease your intake of foods high in solid fats, added sugars, and salt. Eat the right amount of calories for you.Get information about a proper diet from your caregiver, if necessary.  Regular physical exercise is one of the most important things you can do for your health. Most adults should get at least 150 minutes of moderate-intensity exercise (any activity that increases your heart rate and causes you to sweat) each week. In addition, most adults need muscle-strengthening exercises on 2 or more days a week.  Maintain a healthy weight. The body mass index (BMI) is a screening tool to identify possible weight problems. It provides an estimate of body fat based on height and weight. Your caregiver can help determine your BMI, and can help you achieve or maintain a healthy weight.For adults 20 years and older:  A BMI below 18.5 is considered underweight.  A BMI of 18.5 to 24.9 is normal.  A BMI of 25 to 29.9 is considered overweight.  A BMI of 30 and above is considered obese.  Maintain normal blood lipids and cholesterol levels by exercising and minimizing your intake of saturated fat. Eat a balanced diet with plenty of fruit and vegetables. Blood tests for lipids and cholesterol should begin at age 20 and be repeated every 5 years. If your lipid or cholesterol levels are high, you are over 50, or you are at high risk for heart disease, you may need your cholesterol levels checked more frequently.Ongoing high lipid and  cholesterol levels should be treated with medicines if diet and exercise are not effective.  If you smoke, find out from your caregiver how to quit. If you do not use tobacco, do not start.  If you are pregnant, do not drink alcohol. If you are breastfeeding, be very cautious about drinking alcohol. If you are not pregnant and choose to drink alcohol, do not exceed 1 drink per day. One drink is considered to be 12 ounces (355 mL) of beer, 5 ounces (148 mL) of wine, or 1.5 ounces (44 mL) of liquor.  Avoid use of street drugs. Do not share needles with anyone. Ask for help if you need support or instructions about stopping the use of drugs.  High blood pressure causes heart disease and increases the risk of stroke. Your blood pressure should be checked at least every 1 to 2 years. Ongoing high blood pressure should be treated with medicines if weight loss and exercise are not effective.  If you are 55 to 77 years old, ask your caregiver if you should take aspirin to prevent strokes.  Diabetes screening involves taking a blood sample to check your fasting blood sugar level. This should   be done once every 3 years, after age 45, if you are within normal weight and without risk factors for diabetes. Testing should be considered at a younger age or be carried out more frequently if you are overweight and have at least 1 risk factor for diabetes.  Breast cancer screening is essential preventive care for women. You should practice "breast self-awareness." This means understanding the normal appearance and feel of your breasts and may include breast self-examination. Any changes detected, no matter how small, should be reported to a caregiver. Women in their 20s and 30s should have a clinical breast exam (CBE) by a caregiver as part of a regular health exam every 1 to 3 years. After age 40, women should have a CBE every year. Starting at age 40, women should consider having a mammography (breast X-ray test)  every year. Women who have a family history of breast cancer should talk to their caregiver about genetic screening. Women at a high risk of breast cancer should talk to their caregivers about having magnetic resonance imaging (MRI) and a mammography every year.  The Pap test is a screening test for cervical cancer. A Pap test can show cell changes on the cervix that might become cervical cancer if left untreated. A Pap test is a procedure in which cells are obtained and examined from the lower end of the uterus (cervix).  Women should have a Pap test starting at age 21.  Between ages 21 and 29, Pap tests should be repeated every 2 years.  Beginning at age 30, you should have a Pap test every 3 years as long as the past 3 Pap tests have been normal.  Some women have medical problems that increase the chance of getting cervical cancer. Talk to your caregiver about these problems. It is especially important to talk to your caregiver if a new problem develops soon after your last Pap test. In these cases, your caregiver may recommend more frequent screening and Pap tests.  The above recommendations are the same for women who have or have not gotten the vaccine for human papillomavirus (HPV).  If you had a hysterectomy for a problem that was not cancer or a condition that could lead to cancer, then you no longer need Pap tests. Even if you no longer need a Pap test, a regular exam is a good idea to make sure no other problems are starting.  If you are between ages 65 and 70, and you have had normal Pap tests going back 10 years, you no longer need Pap tests. Even if you no longer need a Pap test, a regular exam is a good idea to make sure no other problems are starting.  If you have had past treatment for cervical cancer or a condition that could lead to cancer, you need Pap tests and screening for cancer for at least 20 years after your treatment.  If Pap tests have been discontinued, risk factors  (such as a new sexual partner) need to be reassessed to determine if screening should be resumed.  The HPV test is an additional test that may be used for cervical cancer screening. The HPV test looks for the virus that can cause the cell changes on the cervix. The cells collected during the Pap test can be tested for HPV. The HPV test could be used to screen women aged 30 years and older, and should be used in women of any age who have unclear Pap test results. After the   age of 30, women should have HPV testing at the same frequency as a Pap test.  Colorectal cancer can be detected and often prevented. Most routine colorectal cancer screening begins at the age of 50 and continues through age 75. However, your caregiver may recommend screening at an earlier age if you have risk factors for colon cancer. On a yearly basis, your caregiver may provide home test kits to check for hidden blood in the stool. Use of a small camera at the end of a tube, to directly examine the colon (sigmoidoscopy or colonoscopy), can detect the earliest forms of colorectal cancer. Talk to your caregiver about this at age 50, when routine screening begins. Direct examination of the colon should be repeated every 5 to 10 years through age 75, unless early forms of pre-cancerous polyps or small growths are found.  Hepatitis C blood testing is recommended for all people born from 1945 through 1965 and any individual with known risks for hepatitis C.  Practice safe sex. Use condoms and avoid high-risk sexual practices to reduce the spread of sexually transmitted infections (STIs). STIs include gonorrhea, chlamydia, syphilis, trichomonas, herpes, HPV, and human immunodeficiency virus (HIV). Herpes, HIV, and HPV are viral illnesses that have no cure. They can result in disability, cancer, and death. Sexually active women aged 25 and younger should be checked for chlamydia. Older women with new or multiple partners should also be tested  for chlamydia. Testing for other STIs is recommended if you are sexually active and at increased risk.  Osteoporosis is a disease in which the bones lose minerals and strength with aging. This can result in serious bone fractures. The risk of osteoporosis can be identified using a bone density scan. Women ages 65 and over and women at risk for fractures or osteoporosis should discuss screening with their caregivers. Ask your caregiver whether you should take a calcium supplement or vitamin D to reduce the rate of osteoporosis.  Menopause can be associated with physical symptoms and risks. Hormone replacement therapy is available to decrease symptoms and risks. You should talk to your caregiver about whether hormone replacement therapy is right for you.  Use sunscreen with sun protection factor (SPF) of 30 or more. Apply sunscreen liberally and repeatedly throughout the day. You should seek shade when your shadow is shorter than you. Protect yourself by wearing long sleeves, pants, a wide-brimmed hat, and sunglasses year round, whenever you are outdoors.  Once a month, do a whole body skin exam, using a mirror to look at the skin on your back. Notify your caregiver of new moles, moles that have irregular borders, moles that are larger than a pencil eraser, or moles that have changed in shape or color.  Stay current with required immunizations.  Influenza. You need a dose every fall (or winter). The composition of the flu vaccine changes each year, so being vaccinated once is not enough.  Pneumococcal polysaccharide. You need 1 to 2 doses if you smoke cigarettes or if you have certain chronic medical conditions. You need 1 dose at age 65 (or older) if you have never been vaccinated.  Tetanus, diphtheria, pertussis (Tdap, Td). Get 1 dose of Tdap vaccine if you are younger than age 65, are over 65 and have contact with an infant, are a healthcare worker, are pregnant, or simply want to be protected from  whooping cough. After that, you need a Td booster dose every 10 years. Consult your caregiver if you have not had at least   3 tetanus and diphtheria-containing shots sometime in your life or have a deep or dirty wound.  HPV. You need this vaccine if you are a woman age 26 or younger. The vaccine is given in 3 doses over 6 months.  Measles, mumps, rubella (MMR). You need at least 1 dose of MMR if you were born in 1957 or later. You may also need a second dose.  Meningococcal. If you are age 19 to 21 and a first-year college student living in a residence hall, or have one of several medical conditions, you need to get vaccinated against meningococcal disease. You may also need additional booster doses.  Zoster (shingles). If you are age 60 or older, you should get this vaccine.  Varicella (chickenpox). If you have never had chickenpox or you were vaccinated but received only 1 dose, talk to your caregiver to find out if you need this vaccine.  Hepatitis A. You need this vaccine if you have a specific risk factor for hepatitis A virus infection or you simply wish to be protected from this disease. The vaccine is usually given as 2 doses, 6 to 18 months apart.  Hepatitis B. You need this vaccine if you have a specific risk factor for hepatitis B virus infection or you simply wish to be protected from this disease. The vaccine is given in 3 doses, usually over 6 months. Preventive Services / Frequency Ages 19 to 39  Blood pressure check.** / Every 1 to 2 years.  Lipid and cholesterol check.** / Every 5 years beginning at age 20.  Clinical breast exam.** / Every 3 years for women in their 20s and 30s.  Pap test.** / Every 2 years from ages 21 through 29. Every 3 years starting at age 30 through age 65 or 70 with a history of 3 consecutive normal Pap tests.  HPV screening.** / Every 3 years from ages 30 through ages 65 to 70 with a history of 3 consecutive normal Pap tests.  Hepatitis C blood  test.** / For any individual with known risks for hepatitis C.  Skin self-exam. / Monthly.  Influenza immunization.** / Every year.  Pneumococcal polysaccharide immunization.** / 1 to 2 doses if you smoke cigarettes or if you have certain chronic medical conditions.  Tetanus, diphtheria, pertussis (Tdap, Td) immunization. / A one-time dose of Tdap vaccine. After that, you need a Td booster dose every 10 years.  HPV immunization. / 3 doses over 6 months, if you are 26 and younger.  Measles, mumps, rubella (MMR) immunization. / You need at least 1 dose of MMR if you were born in 1957 or later. You may also need a second dose.  Meningococcal immunization. / 1 dose if you are age 19 to 21 and a first-year college student living in a residence hall, or have one of several medical conditions, you need to get vaccinated against meningococcal disease. You may also need additional booster doses.  Varicella immunization.** / Consult your caregiver.  Hepatitis A immunization.** / Consult your caregiver. 2 doses, 6 to 18 months apart.  Hepatitis B immunization.** / Consult your caregiver. 3 doses usually over 6 months. Ages 40 to 64  Blood pressure check.** / Every 1 to 2 years.  Lipid and cholesterol check.** / Every 5 years beginning at age 20.  Clinical breast exam.** / Every year after age 40.  Mammogram.** / Every year beginning at age 40 and continuing for as long as you are in good health. Consult with your caregiver.    Pap test.** / Every 3 years starting at age 30 through age 65 or 70 with a history of 3 consecutive normal Pap tests.  HPV screening.** / Every 3 years from ages 30 through ages 65 to 70 with a history of 3 consecutive normal Pap tests.  Fecal occult blood test (FOBT) of stool. / Every year beginning at age 50 and continuing until age 75. You may not need to do this test if you get a colonoscopy every 10 years.  Flexible sigmoidoscopy or colonoscopy.** / Every 5 years  for a flexible sigmoidoscopy or every 10 years for a colonoscopy beginning at age 50 and continuing until age 75.  Hepatitis C blood test.** / For all people born from 1945 through 1965 and any individual with known risks for hepatitis C.  Skin self-exam. / Monthly.  Influenza immunization.** / Every year.  Pneumococcal polysaccharide immunization.** / 1 to 2 doses if you smoke cigarettes or if you have certain chronic medical conditions.  Tetanus, diphtheria, pertussis (Tdap, Td) immunization.** / A one-time dose of Tdap vaccine. After that, you need a Td booster dose every 10 years.  Measles, mumps, rubella (MMR) immunization. / You need at least 1 dose of MMR if you were born in 1957 or later. You may also need a second dose.  Varicella immunization.** / Consult your caregiver.  Meningococcal immunization.** / Consult your caregiver.  Hepatitis A immunization.** / Consult your caregiver. 2 doses, 6 to 18 months apart.  Hepatitis B immunization.** / Consult your caregiver. 3 doses, usually over 6 months. Ages 65 and over  Blood pressure check.** / Every 1 to 2 years.  Lipid and cholesterol check.** / Every 5 years beginning at age 20.  Clinical breast exam.** / Every year after age 40.  Mammogram.** / Every year beginning at age 40 and continuing for as long as you are in good health. Consult with your caregiver.  Pap test.** / Every 3 years starting at age 30 through age 65 or 70 with a 3 consecutive normal Pap tests. Testing can be stopped between 65 and 70 with 3 consecutive normal Pap tests and no abnormal Pap or HPV tests in the past 10 years.  HPV screening.** / Every 3 years from ages 30 through ages 65 or 70 with a history of 3 consecutive normal Pap tests. Testing can be stopped between 65 and 70 with 3 consecutive normal Pap tests and no abnormal Pap or HPV tests in the past 10 years.  Fecal occult blood test (FOBT) of stool. / Every year beginning at age 50 and  continuing until age 75. You may not need to do this test if you get a colonoscopy every 10 years.  Flexible sigmoidoscopy or colonoscopy.** / Every 5 years for a flexible sigmoidoscopy or every 10 years for a colonoscopy beginning at age 50 and continuing until age 75.  Hepatitis C blood test.** / For all people born from 1945 through 1965 and any individual with known risks for hepatitis C.  Osteoporosis screening.** / A one-time screening for women ages 65 and over and women at risk for fractures or osteoporosis.  Skin self-exam. / Monthly.  Influenza immunization.** / Every year.  Pneumococcal polysaccharide immunization.** / 1 dose at age 65 (or older) if you have never been vaccinated.  Tetanus, diphtheria, pertussis (Tdap, Td) immunization. / A one-time dose of Tdap vaccine if you are over 65 and have contact with an infant, are a healthcare worker, or simply want to be   protected from whooping cough. After that, you need a Td booster dose every 10 years.  Varicella immunization.** / Consult your caregiver.  Meningococcal immunization.** / Consult your caregiver.  Hepatitis A immunization.** / Consult your caregiver. 2 doses, 6 to 18 months apart.  Hepatitis B immunization.** / Check with your caregiver. 3 doses, usually over 6 months. ** Family history and personal history of risk and conditions may change your caregiver's recommendations. Document Released: 09/24/2001 Document Revised: 10/21/2011 Document Reviewed: 12/24/2010 [employer] Patient Information 2014 [employer].  

## 2013-06-09 NOTE — Assessment & Plan Note (Addendum)

## 2013-06-09 NOTE — Assessment & Plan Note (Signed)
Her BP is well controlled Today I will check her labs to look for secondary causes of HTN and end organ damage 

## 2013-06-21 ENCOUNTER — Telehealth: Payer: Self-pay

## 2013-06-21 NOTE — Telephone Encounter (Signed)
The patient called and is hoping to get a repeat blood test.  She didn't know exactly what she needed, but stated she was told to come back in three weeks.

## 2013-06-30 ENCOUNTER — Ambulatory Visit (INDEPENDENT_AMBULATORY_CARE_PROVIDER_SITE_OTHER): Payer: Medicare Other | Admitting: Internal Medicine

## 2013-06-30 ENCOUNTER — Other Ambulatory Visit (INDEPENDENT_AMBULATORY_CARE_PROVIDER_SITE_OTHER): Payer: Medicare Other

## 2013-06-30 ENCOUNTER — Encounter: Payer: Self-pay | Admitting: Internal Medicine

## 2013-06-30 VITALS — BP 132/80 | HR 53 | Temp 98.6°F | Resp 16 | Ht 66.0 in | Wt 175.0 lb

## 2013-06-30 DIAGNOSIS — I10 Essential (primary) hypertension: Secondary | ICD-10-CM

## 2013-06-30 DIAGNOSIS — D696 Thrombocytopenia, unspecified: Secondary | ICD-10-CM

## 2013-06-30 LAB — CBC WITH DIFFERENTIAL/PLATELET
Basophils Absolute: 0.1 10*3/uL (ref 0.0–0.1)
Basophils Relative: 0.7 % (ref 0.0–3.0)
Eosinophils Absolute: 0.5 10*3/uL (ref 0.0–0.7)
Eosinophils Relative: 5.3 % — ABNORMAL HIGH (ref 0.0–5.0)
HCT: 44.4 % (ref 36.0–46.0)
Hemoglobin: 15.1 g/dL — ABNORMAL HIGH (ref 12.0–15.0)
Lymphocytes Relative: 28 % (ref 12.0–46.0)
Lymphs Abs: 2.7 10*3/uL (ref 0.7–4.0)
MCHC: 34 g/dL (ref 30.0–36.0)
MCV: 88.9 fl (ref 78.0–100.0)
Monocytes Absolute: 0.8 10*3/uL (ref 0.1–1.0)
Monocytes Relative: 8 % (ref 3.0–12.0)
Neutro Abs: 5.7 10*3/uL (ref 1.4–7.7)
Neutrophils Relative %: 58 % (ref 43.0–77.0)
Platelets: 58 10*3/uL — ABNORMAL LOW (ref 150.0–400.0)
RBC: 4.99 Mil/uL (ref 3.87–5.11)
RDW: 13.4 % (ref 11.5–14.6)
WBC: 9.8 10*3/uL (ref 4.5–10.5)

## 2013-06-30 NOTE — Progress Notes (Signed)
Pre visit review using our clinic review tool, if applicable. No additional management support is needed unless otherwise documented below in the visit note. 

## 2013-06-30 NOTE — Progress Notes (Signed)
Subjective:    Patient ID: Jocelyn [name], female    DOB: [birth year], 79 y.o.   MRN: [id number]  HPI  She returns for f/up on recent lab work that showed a low PLT count. She has few very small bruises on her forearms and legs but otherwise reports no trouble with bleeding or bruising. She admits to qd consumption of alcohol (1-2 beers and 1-2 glasses of wine per day). She also take asa as needed for pain.   Review of Systems  Constitutional: Negative.  Negative for fever, chills, diaphoresis, appetite change and fatigue.  HENT: Negative.  Negative for tinnitus, trouble swallowing and voice change.   Eyes: Negative.   Respiratory: Negative.  Negative for cough, chest tightness, shortness of breath, wheezing and stridor.   Cardiovascular: Negative.  Negative for chest pain, palpitations and leg swelling.  Gastrointestinal: Negative.  Negative for nausea, vomiting, abdominal pain, diarrhea, constipation and blood in stool.  Endocrine: Negative.   Genitourinary: Negative for hematuria, flank pain, vaginal bleeding, vaginal discharge and difficulty urinating.  Musculoskeletal: Negative.   Skin: Negative.  Negative for color change, pallor, rash and wound.  Allergic/Immunologic: Negative.   Neurological: Negative.   Hematological: Negative for adenopathy. Bruises/bleeds easily.  Psychiatric/Behavioral: Negative.        Objective:   Physical Exam  Vitals reviewed. Constitutional: She is oriented to person, place, and time. She appears well-developed and well-nourished. No distress.  HENT:  Head: Normocephalic and atraumatic.  Mouth/Throat: Oropharynx is clear and moist. No oropharyngeal exudate.  Eyes: Conjunctivae are normal. Right eye exhibits no discharge. Left eye exhibits no discharge. No scleral icterus.  Neck: Normal range of motion. Neck supple. No JVD present. No tracheal deviation present. No thyromegaly present.  Cardiovascular: Normal rate, regular rhythm, normal heart sounds  and intact distal pulses.  Exam reveals no gallop and no friction rub.   No murmur heard. Pulmonary/Chest: Effort normal and breath sounds normal. No stridor. No respiratory distress. She has no wheezes. She has no rales. She exhibits no tenderness.  Abdominal: Soft. Normal appearance and bowel sounds are normal. She exhibits no distension and no mass. There is no hepatosplenomegaly, splenomegaly or hepatomegaly. There is no tenderness. There is no rebound, no guarding and no CVA tenderness. No hernia. Hernia confirmed negative in the ventral area, confirmed negative in the right inguinal area and confirmed negative in the left inguinal area.  Musculoskeletal: Normal range of motion. She exhibits no edema and no tenderness.  Lymphadenopathy:    She has no cervical adenopathy.  Neurological: She is oriented to person, place, and time.  Skin: Skin is warm, dry and intact. Ecchymosis noted. No abrasion, no bruising, no burn, no laceration, no lesion, no petechiae, no purpura and no rash noted. Rash is not macular, not papular, not maculopapular, not nodular, not pustular, not vesicular and not urticarial. She is not diaphoretic. No cyanosis or erythema. No pallor. Nails show no clubbing.     Psychiatric: She has a normal mood and affect. Her behavior is normal. Judgment and thought content normal.     Lab Results  Component Value Date   WBC 8.4 06/09/2013   HGB 14.9 06/09/2013   HCT 43.2 06/09/2013   PLT 66.0 Repeated and verified X2.* 06/09/2013   GLUCOSE 92 06/09/2013   CHOL 188 06/09/2013   TRIG 139.0 06/09/2013   HDL 53.90 06/09/2013   LDLDIRECT 128.9 04/08/2011   LDLCALC 106* 06/09/2013   ALT 15 06/09/2013   AST 17 06/09/2013   NA  139 06/09/2013   K 4.8 06/09/2013   CL 104 06/09/2013   CREATININE 1.1 06/09/2013   BUN 15 06/09/2013   CO2 28 06/09/2013   TSH 0.99 06/09/2013   HGBA1C 5.5 06/09/2013       Assessment & Plan:

## 2013-06-30 NOTE — Assessment & Plan Note (Signed)
Her BP is well controlled 

## 2013-06-30 NOTE — Assessment & Plan Note (Signed)
This is most likely related to her EtOH intake and aspirin use She was asked to stop both She may also have ITP but it is stable and dose not require treatment I will recheck her CBC today and will advise further

## 2013-06-30 NOTE — Patient Instructions (Signed)
Blood Tests  Blood tests were taken today. This is often done to evaluate a medical condition. Small amounts of blood are usually taken from a vein in your arm. The blood samples are then sent to the lab for analysis. Many types of tests can be run on blood and some of them include:  · CBC. This test measures the levels of various cells in the blood (such as hemoglobin, white blood cells and platelets). This test helps to determine such conditions as anemia, infection, leukemia and clotting problems.  · Chemistries. These tests measure the levels of various minerals (electrolytes) in the blood (such as magnesium, potassium, sodium and chloride). These tests evaluate heart, liver, pancreas and kidney function.  · Antibody testing. AIDS and other viral infections, many diseases caused by germs (bacterial diseases), and inflammatory diseases (such as arthritis) may be evaluated by these tests.  · Lipids. These tests measure cholesterol levels, including HDL (good cholesterol), LDL (bad cholesterol) and triglyceride levels. Lipid levels can be used to determine the risk of developing heart disease.  · Drug tests. These tests measure the levels of some drugs, such as seizure medicines and antibiotics. Knowing the levels helps your caregiver accurately adjust the dose of medicines.  · Clotting studies. These tests measure the time it takes for your blood to clot (such as prothrombin time and INR). Knowing the levels can help your caregiver accurately adjust the dose of blood thinners.  · Hormone levels. These tests measure levels of certain chemicals (hormones) in the body. Thyroid function tests are usually done to diagnose thyroid problems and to follow treatment.  These are just some of the tests done on blood.  If you notice unusual swelling, redness or tenderness around the needle puncture site, contact your caregiver.  Not all test results are available during your visit. If your test results are not back during the  visit, make an appointment with your caregiver to find out the results. Do not assume everything is normal if you have not heard from your caregiver or the medical facility. It is important for you to follow up on all of your test results.  Document Released: 07/29/2005 Document Revised: 10/21/2011 Document Reviewed: 06/30/2009  [employer]® Patient Information ©2014 [employer].

## 2013-07-04 ENCOUNTER — Encounter: Payer: Self-pay | Admitting: Internal Medicine

## 2013-08-27 ENCOUNTER — Ambulatory Visit (INDEPENDENT_AMBULATORY_CARE_PROVIDER_SITE_OTHER)
Admission: RE | Admit: 2013-08-27 | Discharge: 2013-08-27 | Disposition: A | Discharge status: Home / Self Care | Payer: Medicare Other | Source: Ambulatory Visit | Attending: Internal Medicine | Admitting: Internal Medicine

## 2013-08-27 ENCOUNTER — Encounter: Payer: Self-pay | Admitting: Internal Medicine

## 2013-08-27 ENCOUNTER — Ambulatory Visit (INDEPENDENT_AMBULATORY_CARE_PROVIDER_SITE_OTHER): Payer: Medicare Other | Admitting: Internal Medicine

## 2013-08-27 VITALS — BP 140/80 | HR 81 | Temp 98.1°F | Resp 16 | Wt 173.4 lb

## 2013-08-27 DIAGNOSIS — J189 Pneumonia, unspecified organism: Secondary | ICD-10-CM | POA: Diagnosis not present

## 2013-08-27 DIAGNOSIS — R059 Cough, unspecified: Secondary | ICD-10-CM | POA: Diagnosis not present

## 2013-08-27 DIAGNOSIS — J9819 Other pulmonary collapse: Secondary | ICD-10-CM | POA: Diagnosis not present

## 2013-08-27 DIAGNOSIS — R05 Cough: Secondary | ICD-10-CM

## 2013-08-27 DIAGNOSIS — R918 Other nonspecific abnormal finding of lung field: Secondary | ICD-10-CM | POA: Diagnosis not present

## 2013-08-27 MED ORDER — CEFUROXIME AXETIL 500 MG PO TABS: 500.0000 mg | ORAL_TABLET | Freq: Two times a day (BID) | ORAL | Status: DC

## 2013-08-27 MED ORDER — PROMETHAZINE-DM 6.25-15 MG/5ML PO SYRP: 5.0000 mL | ORAL_SOLUTION | Freq: Four times a day (QID) | ORAL | Status: DC | PRN

## 2013-08-27 NOTE — Patient Instructions (Signed)

## 2013-08-27 NOTE — Progress Notes (Signed)
Pre visit review using our clinic review tool, if applicable. No additional management support is needed unless otherwise documented below in the visit note. 

## 2013-08-29 ENCOUNTER — Encounter: Payer: Self-pay | Admitting: Internal Medicine

## 2013-08-29 DIAGNOSIS — R918 Other nonspecific abnormal finding of lung field: Secondary | ICD-10-CM | POA: Insufficient documentation

## 2013-08-29 NOTE — Assessment & Plan Note (Signed)
Her CXR is positive for nodules

## 2013-08-29 NOTE — Assessment & Plan Note (Signed)
I will treat the infection with ceftin and will control the cough with [id number] 

## 2013-08-29 NOTE — Progress Notes (Signed)
   Subjective:    Patient ID: Jocelyn [name], female    DOB: [birth year], 80 y.o.   MRN: [id number]  Cough This is a new problem. The current episode started more than 1 month ago. The problem has been unchanged. The problem occurs every few hours. The cough is productive of purulent sputum. Pertinent negatives include no chest pain, chills, ear congestion, ear pain, fever, headaches, heartburn, hemoptysis, myalgias, nasal congestion, postnasal drip, rash, rhinorrhea, sore throat, shortness of breath, sweats, weight loss or wheezing. The symptoms are aggravated by cold air. She has tried OTC cough suppressant for the symptoms. The treatment provided moderate relief. There is no history of asthma, bronchiectasis, bronchitis, COPD, emphysema, environmental allergies or pneumonia.      Review of Systems  Constitutional: Negative.  Negative for fever, chills, weight loss, diaphoresis, activity change, appetite change, fatigue and unexpected weight change.  HENT: Negative.  Negative for ear pain, postnasal drip, rhinorrhea and sore throat.   Eyes: Negative.   Respiratory: Positive for cough. Negative for apnea, hemoptysis, choking, chest tightness, shortness of breath, wheezing and stridor.   Cardiovascular: Negative.  Negative for chest pain, palpitations and leg swelling.  Gastrointestinal: Negative.  Negative for heartburn, nausea, vomiting, abdominal pain, diarrhea, constipation and blood in stool.  Endocrine: Negative.   Genitourinary: Negative.   Musculoskeletal: Negative.  Negative for arthralgias, joint swelling, myalgias, neck pain and neck stiffness.  Skin: Negative.  Negative for rash.  Allergic/Immunologic: Negative.  Negative for environmental allergies.  Neurological: Negative.  Negative for headaches.  Hematological: Negative.  Negative for adenopathy. Does not bruise/bleed easily.  Psychiatric/Behavioral: Negative.        Objective:   Physical Exam  Vitals  reviewed. Constitutional: She is oriented to person, place, and time. She appears well-developed and well-nourished. No distress.  HENT:  Head: Normocephalic and atraumatic.  Mouth/Throat: Oropharynx is clear and moist. No oropharyngeal exudate.  Eyes: Conjunctivae are normal. Right eye exhibits no discharge. Left eye exhibits no discharge. No scleral icterus.  Neck: Normal range of motion. Neck supple. No JVD present. No tracheal deviation present. No thyromegaly present.  Cardiovascular: Normal rate, regular rhythm, normal heart sounds and intact distal pulses.  Exam reveals no gallop and no friction rub.   No murmur heard. Pulmonary/Chest: Effort normal and breath sounds normal. No stridor. No respiratory distress. She has no wheezes. She has no rales. She exhibits no tenderness.  Abdominal: Soft. Bowel sounds are normal. She exhibits no distension and no mass. There is no tenderness. There is no rebound and no guarding.  Musculoskeletal: Normal range of motion. She exhibits no edema and no tenderness.  Lymphadenopathy:    She has no cervical adenopathy.  Neurological: She is oriented to person, place, and time.  Skin: Skin is warm and dry. No rash noted. She is not diaphoretic. No erythema. No pallor.  Psychiatric: She has a normal mood and affect. Her behavior is normal. Judgment and thought content normal.          Assessment & Plan:

## 2013-08-29 NOTE — Assessment & Plan Note (Signed)
She has a remote history of tobacco abuse so I will follow up closely on this For now, I will treat this as infectious inflammatory nodules with ceftin and will repeat the CXR soon

## 2013-09-10 ENCOUNTER — Ambulatory Visit (INDEPENDENT_AMBULATORY_CARE_PROVIDER_SITE_OTHER): Payer: Medicare Other | Admitting: Internal Medicine

## 2013-09-10 ENCOUNTER — Encounter: Payer: Self-pay | Admitting: Internal Medicine

## 2013-09-10 ENCOUNTER — Other Ambulatory Visit (INDEPENDENT_AMBULATORY_CARE_PROVIDER_SITE_OTHER): Payer: Medicare Other

## 2013-09-10 VITALS — BP 148/82 | HR 54 | Temp 97.6°F | Resp 16 | Ht 66.0 in | Wt 171.0 lb

## 2013-09-10 DIAGNOSIS — N390 Urinary tract infection, site not specified: Secondary | ICD-10-CM | POA: Diagnosis not present

## 2013-09-10 DIAGNOSIS — R059 Cough, unspecified: Secondary | ICD-10-CM

## 2013-09-10 DIAGNOSIS — R918 Other nonspecific abnormal finding of lung field: Secondary | ICD-10-CM

## 2013-09-10 DIAGNOSIS — R05 Cough: Secondary | ICD-10-CM

## 2013-09-10 DIAGNOSIS — I10 Essential (primary) hypertension: Secondary | ICD-10-CM

## 2013-09-10 DIAGNOSIS — J189 Pneumonia, unspecified organism: Secondary | ICD-10-CM

## 2013-09-10 LAB — BASIC METABOLIC PANEL
BUN: 14 mg/dL (ref 6–23)
CO2: 24 mEq/L (ref 19–32)
Calcium: 10.4 mg/dL (ref 8.4–10.5)
Chloride: 108 mEq/L (ref 96–112)
Creatinine, Ser: 1.1 mg/dL (ref 0.4–1.2)
GFR: 50.37 mL/min — ABNORMAL LOW (ref >60.00)
Glucose, Bld: 96 mg/dL (ref 70–99)
Potassium: 4.1 mEq/L (ref 3.5–5.1)
Sodium: 140 mEq/L (ref 135–145)

## 2013-09-10 MED ORDER — CIPROFLOXACIN HCL 250 MG PO TABS: 250.0000 mg | ORAL_TABLET | Freq: Two times a day (BID) | ORAL | Status: DC

## 2013-09-10 MED ORDER — FLUCONAZOLE 150 MG PO TABS: 150.0000 mg | ORAL_TABLET | Freq: Once | ORAL | Status: DC

## 2013-09-10 NOTE — Progress Notes (Signed)
Pre visit review using our clinic review tool, if applicable. No additional management support is needed unless otherwise documented below in the visit note. 

## 2013-09-10 NOTE — Progress Notes (Signed)
Subjective:    Patient ID: Jocelyn [name], female    DOB: [birth year], 80 y.o.   MRN: [id number]  Dysuria  This is a new problem. The current episode started today. The problem occurs every urination. The problem has been rapidly worsening. The quality of the pain is described as burning. The pain is at a severity of 1/10. The patient is experiencing no pain. There has been no fever. The fever has been present for less than 1 day. She is not sexually active. There is no history of pyelonephritis. Associated symptoms include frequency and urgency. Pertinent negatives include no chills, discharge, flank pain, hematuria, hesitancy, nausea, sweats or vomiting. She has tried home medications for the symptoms. The treatment provided mild relief. There is no history of catheterization, kidney stones, recurrent UTIs, a single kidney, urinary stasis or a urological procedure.      Review of Systems  Constitutional: Negative for fever, chills, diaphoresis, appetite change and fatigue.  HENT: Negative.   Eyes: Negative.   Respiratory: Positive for cough (mild NP cough). Negative for apnea, choking, chest tightness, shortness of breath, wheezing and stridor.   Cardiovascular: Negative.  Negative for chest pain, palpitations and leg swelling.  Gastrointestinal: Negative.  Negative for nausea, vomiting and abdominal pain.  Endocrine: Negative.   Genitourinary: Positive for dysuria, urgency and frequency. Negative for hesitancy, hematuria, flank pain, decreased urine volume, vaginal bleeding, enuresis, difficulty urinating, genital sores, vaginal pain, menstrual problem, pelvic pain and dyspareunia.  Musculoskeletal: Negative.   Skin: Negative.   Allergic/Immunologic: Negative.   Neurological: Negative.  Negative for dizziness, tremors and weakness.  Hematological: Negative.  Negative for adenopathy. Does not bruise/bleed easily.  Psychiatric/Behavioral: Negative.        Objective:   Physical Exam    Vitals reviewed. Constitutional: She is oriented to person, place, and time. She appears well-developed and well-nourished.  Non-toxic appearance. She does not have a sickly appearance. She does not appear ill. No distress.  HENT:  Head: Normocephalic and atraumatic.  Mouth/Throat: Oropharynx is clear and moist. No oropharyngeal exudate.  Eyes: Conjunctivae are normal. Right eye exhibits no discharge. Left eye exhibits no discharge. No scleral icterus.  Neck: Normal range of motion. Neck supple. No JVD present. No tracheal deviation present. No thyromegaly present.  Cardiovascular: Normal rate, regular rhythm, normal heart sounds and intact distal pulses.  Exam reveals no gallop and no friction rub.   No murmur heard. Pulmonary/Chest: Effort normal and breath sounds normal. No stridor. No respiratory distress. She has no wheezes. She has no rales. She exhibits no tenderness.  Abdominal: Soft. Bowel sounds are normal. She exhibits no distension and no mass. There is no hepatosplenomegaly, splenomegaly or hepatomegaly. There is no tenderness. There is no rebound, no guarding and no CVA tenderness. No hernia. Hernia confirmed negative in the ventral area.  Musculoskeletal: Normal range of motion. She exhibits no edema and no tenderness.  Lymphadenopathy:    She has no cervical adenopathy.  Neurological: She is oriented to person, place, and time.  Skin: Skin is warm and dry. No rash noted. She is not diaphoretic. No erythema. No pallor.  Psychiatric: She has a normal mood and affect. Her behavior is normal. Judgment and thought content normal.     Lab Results  Component Value Date   WBC 9.8 06/30/2013   HGB 15.1* 06/30/2013   HCT 44.4 06/30/2013   PLT 58.0* 06/30/2013   GLUCOSE 92 06/09/2013   CHOL 188 06/09/2013   TRIG 139.0 06/09/2013  HDL 53.90 06/09/2013   LDLDIRECT 128.9 04/08/2011   LDLCALC 106* 06/09/2013   ALT 15 06/09/2013   AST 17 06/09/2013   NA 139 06/09/2013   K 4.8  06/09/2013   CL 104 06/09/2013   CREATININE 1.1 06/09/2013   BUN 15 06/09/2013   CO2 28 06/09/2013   TSH 0.99 06/09/2013   HGBA1C 5.5 06/09/2013       Assessment & Plan:

## 2013-09-10 NOTE — Patient Instructions (Signed)
Urinary Tract Infection  Urinary tract infections (UTIs) can develop anywhere along your urinary tract. Your urinary tract is your body's drainage system for removing wastes and extra water. Your urinary tract includes two kidneys, two ureters, a bladder, and a urethra. Your kidneys are a pair of bean-shaped organs. Each kidney is about the size of your fist. They are located below your ribs, one on each side of your spine.  CAUSES  Infections are caused by microbes, which are microscopic organisms, including fungi, viruses, and bacteria. These organisms are so small that they can only be seen through a microscope. Bacteria are the microbes that most commonly cause UTIs.  SYMPTOMS   Symptoms of UTIs may vary by age and gender of the patient and by the location of the infection. Symptoms in young women typically include a frequent and intense urge to urinate and a painful, burning feeling in the bladder or urethra during urination. Older women and men are more likely to be tired, shaky, and weak and have muscle aches and abdominal pain. A fever may mean the infection is in your kidneys. Other symptoms of a kidney infection include pain in your back or sides below the ribs, nausea, and vomiting.  DIAGNOSIS  To diagnose a UTI, your caregiver will ask you about your symptoms. Your caregiver also will ask to provide a urine sample. The urine sample will be tested for bacteria and white blood cells. White blood cells are made by your body to help fight infection.  TREATMENT   Typically, UTIs can be treated with medication. Because most UTIs are caused by a bacterial infection, they usually can be treated with the use of antibiotics. The choice of antibiotic and length of treatment depend on your symptoms and the type of bacteria causing your infection.  HOME CARE INSTRUCTIONS   If you were prescribed antibiotics, take them exactly as your caregiver instructs you. Finish the medication even if you feel better after you  have only taken some of the medication.   Drink enough water and fluids to keep your urine clear or pale yellow.   Avoid caffeine, tea, and carbonated beverages. They tend to irritate your bladder.   Empty your bladder often. Avoid holding urine for long periods of time.   Empty your bladder before and after sexual intercourse.   After a bowel movement, women should cleanse from front to back. Use each tissue only once.  SEEK MEDICAL CARE IF:    You have back pain.   You develop a fever.   Your symptoms do not begin to resolve within 3 days.  SEEK IMMEDIATE MEDICAL CARE IF:    You have severe back pain or lower abdominal pain.   You develop chills.   You have nausea or vomiting.   You have continued burning or discomfort with urination.  MAKE SURE YOU:    Understand these instructions.   Will watch your condition.   Will get help right away if you are not doing well or get worse.  Document Released: 05/08/2005 Document Revised: 01/28/2012 Document Reviewed: 09/06/2011  [employer] Patient Information 2014 [employer].

## 2013-09-10 NOTE — Assessment & Plan Note (Signed)
Her urine is stained with Azo so we could not do a dipstick UA, we sent urine down for culture For now, will treat with cipro for presumed UTI

## 2013-09-10 NOTE — Assessment & Plan Note (Signed)
I have ordered a CT to see if these are mailgnant

## 2013-09-10 NOTE — Assessment & Plan Note (Signed)
This has resolved.

## 2013-09-11 LAB — CULTURE, URINE COMPREHENSIVE
Colony Count: NO GROWTH
Organism ID, Bacteria: NO GROWTH

## 2013-09-13 ENCOUNTER — Ambulatory Visit (INDEPENDENT_AMBULATORY_CARE_PROVIDER_SITE_OTHER)
Admission: RE | Admit: 2013-09-13 | Discharge: 2013-09-13 | Disposition: A | Discharge status: Home / Self Care | Payer: Medicare Other | Source: Ambulatory Visit | Attending: Internal Medicine | Admitting: Internal Medicine

## 2013-09-13 ENCOUNTER — Encounter: Payer: Self-pay | Admitting: Internal Medicine

## 2013-09-13 DIAGNOSIS — R918 Other nonspecific abnormal finding of lung field: Secondary | ICD-10-CM

## 2013-09-13 DIAGNOSIS — R05 Cough: Secondary | ICD-10-CM | POA: Diagnosis not present

## 2013-09-13 DIAGNOSIS — R059 Cough, unspecified: Secondary | ICD-10-CM | POA: Diagnosis not present

## 2013-09-13 DIAGNOSIS — J984 Other disorders of lung: Secondary | ICD-10-CM | POA: Diagnosis not present

## 2013-09-13 MED ORDER — IOHEXOL 300 MG/ML  SOLN
80.0000 mL | Freq: Once | INTRAMUSCULAR | Status: AC | PRN
  Administered 2013-09-13: 80 mL via INTRAVENOUS

## 2013-09-30 ENCOUNTER — Ambulatory Visit: Payer: Medicare Other | Admitting: Internal Medicine

## 2013-10-04 ENCOUNTER — Encounter: Payer: Self-pay | Admitting: Internal Medicine

## 2013-10-04 ENCOUNTER — Ambulatory Visit (INDEPENDENT_AMBULATORY_CARE_PROVIDER_SITE_OTHER): Payer: Medicare Other | Admitting: Internal Medicine

## 2013-10-04 VITALS — BP 140/80 | HR 63 | Temp 98.0°F | Resp 16 | Ht 66.0 in | Wt 172.5 lb

## 2013-10-04 DIAGNOSIS — J440 Chronic obstructive pulmonary disease with acute lower respiratory infection: Secondary | ICD-10-CM | POA: Insufficient documentation

## 2013-10-04 DIAGNOSIS — J209 Acute bronchitis, unspecified: Secondary | ICD-10-CM

## 2013-10-04 DIAGNOSIS — Z79899 Other long term (current) drug therapy: Secondary | ICD-10-CM | POA: Diagnosis not present

## 2013-10-04 DIAGNOSIS — I10 Essential (primary) hypertension: Secondary | ICD-10-CM | POA: Diagnosis not present

## 2013-10-04 MED ORDER — ACLIDINIUM BROMIDE 400 MCG/ACT IN AEPB: 1.0000 | INHALATION_SPRAY | Freq: Two times a day (BID) | RESPIRATORY_TRACT | Status: DC

## 2013-10-04 NOTE — Patient Instructions (Signed)
Chronic Obstructive Pulmonary Disease Exacerbation °Chronic obstructive pulmonary disease (COPD) is a common lung condition in which airflow from the lungs is limited. COPD is a general term that can be used to describe many different lung problems that limit airflow, including chronic bronchitis and emphysema. COPD exacerbations are episodes when breathing symptoms become much worse and require extra treatment. Without treatment, COPD exacerbations can be life threatening, and frequent COPD exacerbations can cause further damage to your lungs. °CAUSES  °· Respiratory infections.   °· Exposure to smoke.   °· Exposure to air pollution, chemical fumes, or dust. °Sometimes there is no apparent cause or trigger. °RISK FACTORS °· Smoking cigarettes. °· Older age. °· Frequent prior COPD exacerbations. °SIGNS AND SYMPTOMS  °· Increased coughing.   °· Increased thick spit (sputum) production.   °· Increased wheezing.   °· Increased shortness of breath.   °· Rapid breathing.   °· Chest tightness. °DIAGNOSIS  °Your medical history, a physical exam, and tests will help your health care provider make a diagnosis. Tests may include: °· A chest X-ray. °· Basic lab tests. °· Sputum testing. °· An arterial blood gas test. °TREATMENT  °Depending on the severity of your COPD exacerbation, you may need to be admitted to a hospital for treatment. Some of the treatments commonly used to treat COPD exacerbations are:  °· Antibiotic medicines.   °· Bronchodilators. These are drugs that expand the air passages. They may be given with an inhaler or nebulizer. Spacer devices may be needed to help improve drug delivery. °· Corticosteroid medicines. °· Supplemental oxygen therapy.   °HOME CARE INSTRUCTIONS  °· Do not smoke. Quitting smoking is very important to prevent COPD from getting worse and exacerbations from happening as often. °· Avoid exposure to all substances that irritate the airway, especially to tobacco smoke.   °· If prescribed,  take your antibiotics as directed. Finish them even if you start to feel better. °· Only take over-the-counter or prescription medicines as directed by your health care provider. It is important to use correct technique with inhaled medicines. °· Drink enough fluids to keep your urine clear or pale yellow (unless you have a medical condition that requires fluid restriction). °· Use a cool mist vaporizer. This makes it easier to clear your chest when you cough.   °· If you have a home nebulizer and oxygen, continue to use them as directed.   °· Maintain all necessary vaccinations to prevent infections.   °· Exercise regularly.   °· Eat a healthy diet.   °· Keep all follow-up appointments as directed by your health care provider. °SEEK IMMEDIATE MEDICAL CARE IF: °· You have worsening shortness of breath.   °· You have trouble talking.   °· You have severe chest pain. °· You have blood in your sputum.  °· You have a fever. °· You have weakness, vomit repeatedly, or faint.   °· You feel confused.   °· You continue to get worse. °MAKE SURE YOU:  °· Understand these instructions. °· Will watch your condition. °· Will get help right away if you are not doing well or get worse. °Document Released: 05/26/2007 Document Revised: 05/19/2013 Document Reviewed: 04/02/2013 °[employer]® Patient Information ©2014 [employer]. ° °

## 2013-10-04 NOTE — Assessment & Plan Note (Signed)
Her BP is adequately well controlled 

## 2013-10-04 NOTE — Progress Notes (Signed)
Subjective:    Patient ID: Jocelyn [name], female    DOB: [birth year], 80 y.o.   MRN: [id number]  Cough This is a recurrent problem. The current episode started more than 1 month ago. The problem has been unchanged. The cough is productive of sputum (prod of clear "foamy" phlegm). Pertinent negatives include no chest pain, chills, ear congestion, ear pain, fever, headaches, heartburn, hemoptysis, myalgias, nasal congestion, postnasal drip, rash, rhinorrhea, sore throat, shortness of breath, sweats, weight loss or wheezing. Nothing aggravates the symptoms. Risk factors for lung disease include smoking/tobacco exposure (she quit smoking about 20 years ago). She has tried prescription cough suppressant for the symptoms. The treatment provided significant relief. There is no history of asthma, bronchiectasis, COPD, emphysema, environmental allergies or pneumonia.      Review of Systems  Constitutional: Negative.  Negative for fever, chills, weight loss, diaphoresis and fatigue.  HENT: Negative.  Negative for ear pain, postnasal drip, rhinorrhea and sore throat.   Eyes: Negative.   Respiratory: Positive for cough. Negative for apnea, hemoptysis, choking, chest tightness, shortness of breath, wheezing and stridor.   Cardiovascular: Negative.  Negative for chest pain, palpitations and leg swelling.  Gastrointestinal: Negative.  Negative for heartburn, nausea, vomiting, abdominal pain, diarrhea and constipation.  Endocrine: Negative.   Genitourinary: Negative.   Musculoskeletal: Negative.  Negative for myalgias.  Skin: Negative.  Negative for rash.  Allergic/Immunologic: Negative.  Negative for environmental allergies.  Neurological: Negative.  Negative for dizziness, tremors, syncope, facial asymmetry, weakness, light-headedness and headaches.  Hematological: Negative.  Negative for adenopathy. Does not bruise/bleed easily.  Psychiatric/Behavioral: Negative.        Objective:   Physical Exam    Vitals reviewed. Constitutional: She is oriented to person, place, and time. She appears well-developed and well-nourished.  Non-toxic appearance. She does not have a sickly appearance. She does not appear ill. No distress. She is not intubated.  HENT:  Head: Normocephalic and atraumatic.  Mouth/Throat: Oropharynx is clear and moist. No oropharyngeal exudate.  Eyes: Conjunctivae are normal. Right eye exhibits no discharge. Left eye exhibits no discharge. No scleral icterus.  Neck: Normal range of motion. Neck supple. No JVD present. No tracheal deviation present. No thyromegaly present.  Cardiovascular: Normal rate, regular rhythm, normal heart sounds and intact distal pulses.  Exam reveals no gallop.   No murmur heard. Pulmonary/Chest: Effort normal and breath sounds normal. No accessory muscle usage or stridor. No apnea, not tachypneic and not bradypneic. She is not intubated. No respiratory distress. She has no decreased breath sounds. She has no wheezes. She has no rhonchi. She has no rales.  Abdominal: Soft. Bowel sounds are normal. She exhibits no distension and no mass. There is no tenderness. There is no rebound and no guarding.  Musculoskeletal: Normal range of motion. She exhibits no edema and no tenderness.  Lymphadenopathy:    She has no cervical adenopathy.  Neurological: She is oriented to person, place, and time.  Skin: Skin is warm and dry. No rash noted. She is not diaphoretic. No erythema. No pallor.  Psychiatric: She has a normal mood and affect. Her behavior is normal. Judgment and thought content normal.     Lab Results  Component Value Date   WBC 9.8 06/30/2013   HGB 15.1* 06/30/2013   HCT 44.4 06/30/2013   PLT 58.0* 06/30/2013   GLUCOSE 96 09/10/2013   CHOL 188 06/09/2013   TRIG 139.0 06/09/2013   HDL 53.90 06/09/2013   LDLDIRECT 128.9 04/08/2011   LDLCALC  106* 06/09/2013   ALT 15 06/09/2013   AST 17 06/09/2013   NA 140 09/10/2013   K 4.1 09/10/2013   CL 108  09/10/2013   CREATININE 1.1 09/10/2013   BUN 14 09/10/2013   CO2 24 09/10/2013   TSH 0.99 06/09/2013   HGBA1C 5.5 06/09/2013       Assessment & Plan:

## 2013-10-04 NOTE — Assessment & Plan Note (Signed)
She will start tudorza - I gave her a sample and showed her how to use it, she demonstrated proficiency with its use I have ordered PFT's to get a better idea of her lung function and capacity

## 2013-10-04 NOTE — Progress Notes (Signed)
Pre visit review using our clinic review tool, if applicable. No additional management support is needed unless otherwise documented below in the visit note. 

## 2013-10-25 ENCOUNTER — Encounter: Payer: Self-pay | Admitting: Internal Medicine

## 2013-12-12 ENCOUNTER — Other Ambulatory Visit: Payer: Self-pay | Admitting: Internal Medicine

## 2013-12-12 DIAGNOSIS — R918 Other nonspecific abnormal finding of lung field: Secondary | ICD-10-CM

## 2013-12-13 ENCOUNTER — Inpatient Hospital Stay: Admission: RE | Admit: 2013-12-13 | Payer: Medicare Other | Source: Ambulatory Visit

## 2013-12-13 ENCOUNTER — Other Ambulatory Visit (INDEPENDENT_AMBULATORY_CARE_PROVIDER_SITE_OTHER): Payer: Medicare Other

## 2013-12-13 ENCOUNTER — Other Ambulatory Visit: Payer: Self-pay | Admitting: Internal Medicine

## 2013-12-13 DIAGNOSIS — I10 Essential (primary) hypertension: Secondary | ICD-10-CM | POA: Diagnosis not present

## 2013-12-13 LAB — BASIC METABOLIC PANEL
BUN: 12 mg/dL (ref 6–23)
CO2: 25 mEq/L (ref 19–32)
Calcium: 10.4 mg/dL (ref 8.4–10.5)
Chloride: 106 mEq/L (ref 96–112)
Creatinine, Ser: 1.1 mg/dL (ref 0.4–1.2)
GFR: 51.95 mL/min — ABNORMAL LOW (ref >60.00)
Glucose, Bld: 96 mg/dL (ref 70–99)
Potassium: 4.3 mEq/L (ref 3.5–5.1)
Sodium: 138 mEq/L (ref 135–145)

## 2013-12-15 ENCOUNTER — Encounter: Payer: Self-pay | Admitting: Internal Medicine

## 2013-12-15 ENCOUNTER — Ambulatory Visit (INDEPENDENT_AMBULATORY_CARE_PROVIDER_SITE_OTHER)
Admission: RE | Admit: 2013-12-15 | Discharge: 2013-12-15 | Disposition: A | Discharge status: Home / Self Care | Payer: Medicare Other | Source: Ambulatory Visit | Attending: Internal Medicine | Admitting: Internal Medicine

## 2013-12-15 DIAGNOSIS — R911 Solitary pulmonary nodule: Secondary | ICD-10-CM | POA: Diagnosis not present

## 2013-12-15 DIAGNOSIS — R918 Other nonspecific abnormal finding of lung field: Secondary | ICD-10-CM | POA: Diagnosis not present

## 2013-12-15 MED ORDER — IOHEXOL 300 MG/ML  SOLN
80.0000 mL | Freq: Once | INTRAMUSCULAR | Status: AC | PRN
  Administered 2013-12-15: 80 mL via INTRAVENOUS

## 2014-06-01 DIAGNOSIS — Z23 Encounter for immunization: Secondary | ICD-10-CM | POA: Diagnosis not present

## 2014-06-19 ENCOUNTER — Other Ambulatory Visit: Payer: Self-pay | Admitting: Internal Medicine

## 2014-06-19 DIAGNOSIS — R918 Other nonspecific abnormal finding of lung field: Secondary | ICD-10-CM

## 2014-06-21 ENCOUNTER — Encounter: Payer: Self-pay | Admitting: Internal Medicine

## 2014-06-21 ENCOUNTER — Ambulatory Visit (INDEPENDENT_AMBULATORY_CARE_PROVIDER_SITE_OTHER)
Admission: RE | Admit: 2014-06-21 | Discharge: 2014-06-21 | Disposition: A | Discharge status: Home / Self Care | Payer: Medicare Other | Source: Ambulatory Visit | Attending: Internal Medicine | Admitting: Internal Medicine

## 2014-06-21 DIAGNOSIS — R918 Other nonspecific abnormal finding of lung field: Secondary | ICD-10-CM | POA: Diagnosis not present

## 2014-06-21 DIAGNOSIS — R911 Solitary pulmonary nodule: Secondary | ICD-10-CM | POA: Diagnosis not present

## 2014-08-16 ENCOUNTER — Other Ambulatory Visit: Payer: Self-pay | Admitting: Internal Medicine

## 2014-10-26 DIAGNOSIS — H2513 Age-related nuclear cataract, bilateral: Secondary | ICD-10-CM | POA: Diagnosis not present

## 2014-10-26 DIAGNOSIS — H524 Presbyopia: Secondary | ICD-10-CM | POA: Diagnosis not present

## 2014-11-14 DIAGNOSIS — H2512 Age-related nuclear cataract, left eye: Secondary | ICD-10-CM | POA: Diagnosis not present

## 2014-11-23 DIAGNOSIS — H2512 Age-related nuclear cataract, left eye: Secondary | ICD-10-CM | POA: Diagnosis not present

## 2014-11-23 DIAGNOSIS — H25812 Combined forms of age-related cataract, left eye: Secondary | ICD-10-CM | POA: Diagnosis not present

## 2014-12-01 DIAGNOSIS — H2511 Age-related nuclear cataract, right eye: Secondary | ICD-10-CM | POA: Diagnosis not present

## 2014-12-12 ENCOUNTER — Ambulatory Visit (INDEPENDENT_AMBULATORY_CARE_PROVIDER_SITE_OTHER): Payer: Medicare Other | Admitting: Internal Medicine

## 2014-12-12 ENCOUNTER — Other Ambulatory Visit (INDEPENDENT_AMBULATORY_CARE_PROVIDER_SITE_OTHER): Payer: Medicare Other

## 2014-12-12 ENCOUNTER — Encounter: Payer: Self-pay | Admitting: Internal Medicine

## 2014-12-12 VITALS — BP 118/70 | HR 58 | Temp 98.0°F | Resp 16 | Ht 66.0 in | Wt 173.0 lb

## 2014-12-12 DIAGNOSIS — I700 Atherosclerosis of aorta: Secondary | ICD-10-CM | POA: Diagnosis not present

## 2014-12-12 DIAGNOSIS — I10 Essential (primary) hypertension: Secondary | ICD-10-CM

## 2014-12-12 DIAGNOSIS — E785 Hyperlipidemia, unspecified: Secondary | ICD-10-CM

## 2014-12-12 DIAGNOSIS — D696 Thrombocytopenia, unspecified: Secondary | ICD-10-CM

## 2014-12-12 DIAGNOSIS — J209 Acute bronchitis, unspecified: Secondary | ICD-10-CM

## 2014-12-12 LAB — COMPREHENSIVE METABOLIC PANEL
ALT: 14 U/L (ref 0–35)
AST: 16 U/L (ref 0–37)
Albumin: 3.8 g/dL (ref 3.5–5.2)
Alkaline Phosphatase: 52 U/L (ref 39–117)
BUN: 7 mg/dL (ref 6–23)
CO2: 27 mEq/L (ref 19–32)
Calcium: 10 mg/dL (ref 8.4–10.5)
Chloride: 105 mEq/L (ref 96–112)
Creatinine, Ser: 0.96 mg/dL (ref 0.40–1.20)
GFR: 59.37 mL/min — ABNORMAL LOW (ref >60.00)
Glucose, Bld: 108 mg/dL — ABNORMAL HIGH (ref 70–99)
Potassium: 4.1 mEq/L (ref 3.5–5.1)
Sodium: 138 mEq/L (ref 135–145)
Total Bilirubin: 0.7 mg/dL (ref 0.2–1.2)
Total Protein: 6.6 g/dL (ref 6.0–8.3)

## 2014-12-12 LAB — CBC WITH DIFFERENTIAL/PLATELET
Basophils Absolute: 0 10*3/uL (ref 0.0–0.1)
Basophils Relative: 0.4 % (ref 0.0–3.0)
Eosinophils Absolute: 0.5 10*3/uL (ref 0.0–0.7)
Eosinophils Relative: 6.1 % — ABNORMAL HIGH (ref 0.0–5.0)
HCT: 42.6 % (ref 36.0–46.0)
Hemoglobin: 14.7 g/dL (ref 12.0–15.0)
Lymphocytes Relative: 22.7 % (ref 12.0–46.0)
Lymphs Abs: 2 10*3/uL (ref 0.7–4.0)
MCHC: 34.6 g/dL (ref 30.0–36.0)
MCV: 88.8 fl (ref 78.0–100.0)
Monocytes Absolute: 1 10*3/uL (ref 0.1–1.0)
Monocytes Relative: 11.5 % (ref 3.0–12.0)
Neutro Abs: 5.3 10*3/uL (ref 1.4–7.7)
Neutrophils Relative %: 59.3 % (ref 43.0–77.0)
Platelets: 36 10*3/uL — CL (ref 150.0–400.0)
RBC: 4.8 Mil/uL (ref 3.87–5.11)
RDW: 13.8 % (ref 11.5–15.5)
WBC: 8.9 10*3/uL (ref 4.0–10.5)

## 2014-12-12 LAB — TSH: TSH: 0.81 u[IU]/mL (ref 0.35–4.50)

## 2014-12-12 LAB — LIPID PANEL
Cholesterol: 166 mg/dL (ref 0–200)
HDL: 53.7 mg/dL (ref >39.00)
LDL Cholesterol: 79 mg/dL (ref 0–99)
NonHDL: 112.3
Total CHOL/HDL Ratio: 3
Triglycerides: 166 mg/dL — ABNORMAL HIGH (ref 0.0–149.0)
VLDL: 33.2 mg/dL (ref 0.0–40.0)

## 2014-12-12 MED ORDER — AMOXICILLIN-POT CLAVULANATE 875-125 MG PO TABS: 1.0000 | ORAL_TABLET | Freq: Two times a day (BID) | ORAL | Status: DC

## 2014-12-12 MED ORDER — PROMETHAZINE-DM 6.25-15 MG/5ML PO SYRP: 5.0000 mL | ORAL_SOLUTION | Freq: Four times a day (QID) | ORAL | Status: DC | PRN

## 2014-12-12 NOTE — Patient Instructions (Signed)
Cough, Adult  A cough is a reflex that helps clear your throat and airways. It can help heal the body or may be a reaction to an irritated airway. A cough may only last 2 or 3 weeks (acute) or may last more than 8 weeks (chronic).  CAUSES Acute cough:  Viral or bacterial infections. Chronic cough:  Infections.  Allergies.  Asthma.  Post-nasal drip.  Smoking.  Heartburn or acid reflux.  Some medicines.  Chronic lung problems (COPD).  Cancer. SYMPTOMS   Cough.  Fever.  Chest pain.  Increased breathing rate.  High-pitched whistling sound when breathing (wheezing).  Colored mucus that you cough up (sputum). TREATMENT   A bacterial cough may be treated with antibiotic medicine.  A viral cough must run its course and will not respond to antibiotics.  Your caregiver may recommend other treatments if you have a chronic cough. HOME CARE INSTRUCTIONS   Only take over-the-counter or prescription medicines for pain, discomfort, or fever as directed by your caregiver. Use cough suppressants only as directed by your caregiver.  Use a cold steam vaporizer or humidifier in your bedroom or home to help loosen secretions.  Sleep in a semi-upright position if your cough is worse at night.  Rest as needed.  Stop smoking if you smoke. SEEK IMMEDIATE MEDICAL CARE IF:   You have pus in your sputum.  Your cough starts to worsen.  You cannot control your cough with suppressants and are losing sleep.  You begin coughing up blood.  You have difficulty breathing.  You develop pain which is getting worse or is uncontrolled with medicine.  You have a fever. MAKE SURE YOU:   Understand these instructions.  Will watch your condition.  Will get help right away if you are not doing well or get worse. Document Released: 01/25/2011 Document Revised: 10/21/2011 Document Reviewed: 01/25/2011 Exit[employer] Patient Information 2015 [employer]. This information is not intended  to replace advice given to you by your health care provider. Make sure you discuss any questions you have with your health care provider.  

## 2014-12-12 NOTE — Assessment & Plan Note (Signed)
Will recheck her lipid panel and will treat if indicated

## 2014-12-12 NOTE — Assessment & Plan Note (Signed)
She has quit smoking Will control her BP Will treat her lipids

## 2014-12-12 NOTE — Progress Notes (Signed)
Pre visit review using our clinic review tool, if applicable. No additional management support is needed unless otherwise documented below in the visit note. 

## 2014-12-12 NOTE — Assessment & Plan Note (Signed)
Will treat the infection with augmentin Will control the cough with phenergan-dm

## 2014-12-12 NOTE — Progress Notes (Signed)
Subjective:    Patient ID: Jocelyn [name], female    DOB: 1931-10-13, 81 y.o.   MRN: [id number]  Cough This is a recurrent problem. The current episode started 1 to 4 weeks ago. The problem has been unchanged. The cough is productive of purulent sputum. Associated symptoms include a sore throat. Pertinent negatives include no chest pain, chills, ear congestion, ear pain, fever, headaches, heartburn, hemoptysis, myalgias, nasal congestion, postnasal drip, rash, rhinorrhea, shortness of breath, sweats, weight loss or wheezing. She has tried OTC cough suppressant for the symptoms. The treatment provided mild relief. Her past medical history is significant for bronchitis and COPD. There is no history of asthma, bronchiectasis, emphysema, environmental allergies or pneumonia.      Review of Systems  Constitutional: Negative.  Negative for fever, chills, weight loss, diaphoresis, appetite change and fatigue.  HENT: Positive for sore throat. Negative for congestion, ear pain, facial swelling, postnasal drip, rhinorrhea and trouble swallowing.   Eyes: Negative.   Respiratory: Positive for cough. Negative for hemoptysis, choking, chest tightness, shortness of breath, wheezing and stridor.   Cardiovascular: Negative.  Negative for chest pain, palpitations and leg swelling.  Gastrointestinal: Negative.  Negative for heartburn, nausea, vomiting, abdominal pain, diarrhea, constipation and blood in stool.  Endocrine: Negative.   Genitourinary: Negative.   Musculoskeletal: Negative.  Negative for myalgias, back pain, joint swelling and arthralgias.  Skin: Negative.  Negative for rash.  Allergic/Immunologic: Negative.  Negative for environmental allergies.  Neurological: Negative.  Negative for dizziness, tremors, syncope, light-headedness and headaches.  Hematological: Negative.  Negative for adenopathy. Does not bruise/bleed easily.  Psychiatric/Behavioral: Negative.        Objective:   Physical Exam    Constitutional: She is oriented to person, place, and time. She appears well-developed and well-nourished. No distress.  HENT:  Head: Normocephalic and atraumatic.  Mouth/Throat: Oropharynx is clear and moist. No oropharyngeal exudate.  Eyes: Conjunctivae are normal. Right eye exhibits no discharge. Left eye exhibits no discharge. No scleral icterus.  Neck: Normal range of motion. Neck supple. No JVD present. No tracheal deviation present. No thyromegaly present.  Cardiovascular: Normal rate, regular rhythm, normal heart sounds and intact distal pulses.  Exam reveals no gallop and no friction rub.   No murmur heard. Pulmonary/Chest: Effort normal and breath sounds normal. No stridor. No respiratory distress. She has no decreased breath sounds. She has no wheezes. She has no rhonchi. She has no rales. She exhibits no tenderness.  Abdominal: Soft. Bowel sounds are normal. She exhibits no distension and no mass. There is no tenderness. There is no rebound and no guarding.  Musculoskeletal: Normal range of motion. She exhibits no edema or tenderness.  Lymphadenopathy:    She has no cervical adenopathy.  Neurological: She is oriented to person, place, and time.  Skin: Skin is warm and dry. No rash noted. She is not diaphoretic. No erythema. No pallor.  Vitals reviewed.    Lab Results  Component Value Date   WBC 9.8 06/30/2013   HGB 15.1* 06/30/2013   HCT 44.4 06/30/2013   PLT 58.0* 06/30/2013   GLUCOSE 96 12/13/2013   CHOL 188 06/09/2013   TRIG 139.0 06/09/2013   HDL 53.90 06/09/2013   LDLDIRECT 128.9 04/08/2011   LDLCALC 106* 06/09/2013   ALT 15 06/09/2013   AST 17 06/09/2013   NA 138 12/13/2013   K 4.3 12/13/2013   CL 106 12/13/2013   CREATININE 1.1 12/13/2013   BUN 12 12/13/2013   CO2 25 12/13/2013  TSH 0.99 06/09/2013   HGBA1C 5.5 06/09/2013       Assessment & Plan:

## 2014-12-12 NOTE — Assessment & Plan Note (Signed)
Her BP is well controlled Will monitor her lytes and renal function 

## 2014-12-13 ENCOUNTER — Other Ambulatory Visit (INDEPENDENT_AMBULATORY_CARE_PROVIDER_SITE_OTHER): Payer: Medicare Other

## 2014-12-13 ENCOUNTER — Encounter: Payer: Self-pay | Admitting: Internal Medicine

## 2014-12-13 ENCOUNTER — Telehealth: Payer: Self-pay

## 2014-12-13 ENCOUNTER — Other Ambulatory Visit: Payer: Self-pay | Admitting: Internal Medicine

## 2014-12-13 DIAGNOSIS — D696 Thrombocytopenia, unspecified: Secondary | ICD-10-CM

## 2014-12-13 LAB — CBC WITH DIFFERENTIAL/PLATELET
Basophils Absolute: 0 10*3/uL (ref 0.0–0.1)
Basophils Relative: 0.4 % (ref 0.0–3.0)
Eosinophils Absolute: 0.5 10*3/uL (ref 0.0–0.7)
Eosinophils Relative: 7 % — ABNORMAL HIGH (ref 0.0–5.0)
HCT: 42 % (ref 36.0–46.0)
Hemoglobin: 14.4 g/dL (ref 12.0–15.0)
Lymphocytes Relative: 23.3 % (ref 12.0–46.0)
Lymphs Abs: 1.8 10*3/uL (ref 0.7–4.0)
MCHC: 34.4 g/dL (ref 30.0–36.0)
MCV: 89 fl (ref 78.0–100.0)
Monocytes Absolute: 0.9 10*3/uL (ref 0.1–1.0)
Monocytes Relative: 11.8 % (ref 3.0–12.0)
Neutro Abs: 4.5 10*3/uL (ref 1.4–7.7)
Neutrophils Relative %: 57.5 % (ref 43.0–77.0)
Platelets: 37 10*3/uL — CL (ref 150.0–400.0)
RBC: 4.72 Mil/uL (ref 3.87–5.11)
RDW: 13.5 % (ref 11.5–15.5)
WBC: 7.8 10*3/uL (ref 4.0–10.5)

## 2014-12-13 NOTE — Telephone Encounter (Signed)
Santiago Glad with [facility] called to results a critical platelet of 37,000. Thanks

## 2014-12-13 NOTE — Addendum Note (Signed)
Addended by: Janith Lima on: 12/13/2014 03:09 PM   Modules accepted: Orders

## 2014-12-13 NOTE — Telephone Encounter (Signed)
See lab result note.

## 2014-12-14 NOTE — Telephone Encounter (Signed)
Called pt spoke with husband left msg to RTC...Johny Chess

## 2014-12-14 NOTE — Telephone Encounter (Signed)
Pt return call back gave her md response on lab report...Jocelyn [name]

## 2014-12-16 ENCOUNTER — Telehealth: Payer: Self-pay | Admitting: Oncology

## 2014-12-16 NOTE — Telephone Encounter (Signed)
Called pt and gave her new pt appt date and time to see Dr. Louis Meckel 01/04/15 10:30 am

## 2014-12-30 ENCOUNTER — Other Ambulatory Visit: Payer: Self-pay | Admitting: Oncology

## 2014-12-30 DIAGNOSIS — D696 Thrombocytopenia, unspecified: Secondary | ICD-10-CM

## 2015-01-04 ENCOUNTER — Other Ambulatory Visit (HOSPITAL_BASED_OUTPATIENT_CLINIC_OR_DEPARTMENT_OTHER): Payer: Medicare Other

## 2015-01-04 ENCOUNTER — Ambulatory Visit: Payer: Medicare Other

## 2015-01-04 ENCOUNTER — Telehealth: Payer: Self-pay | Admitting: Oncology

## 2015-01-04 ENCOUNTER — Ambulatory Visit (HOSPITAL_BASED_OUTPATIENT_CLINIC_OR_DEPARTMENT_OTHER): Payer: Medicare Other | Admitting: Oncology

## 2015-01-04 ENCOUNTER — Encounter: Payer: Self-pay | Admitting: Oncology

## 2015-01-04 VITALS — BP 144/80 | HR 56 | Temp 97.5°F | Resp 18 | Ht 66.0 in | Wt 174.3 lb

## 2015-01-04 DIAGNOSIS — R5383 Other fatigue: Secondary | ICD-10-CM

## 2015-01-04 DIAGNOSIS — D696 Thrombocytopenia, unspecified: Secondary | ICD-10-CM | POA: Diagnosis not present

## 2015-01-04 LAB — CBC WITH DIFFERENTIAL/PLATELET
BASO%: 0.8 % (ref 0.0–2.0)
Basophils Absolute: 0.1 10*3/uL (ref 0.0–0.1)
EOS%: 6.3 % (ref 0.0–7.0)
Eosinophils Absolute: 0.5 10*3/uL (ref 0.0–0.5)
HCT: 43.2 % (ref 34.8–46.6)
HGB: 14.8 g/dL (ref 11.6–15.9)
LYMPH%: 33 % (ref 14.0–49.7)
MCH: 31.4 pg (ref 25.1–34.0)
MCHC: 34.3 g/dL (ref 31.5–36.0)
MCV: 91.5 fL (ref 79.5–101.0)
MONO#: 0.7 10*3/uL (ref 0.1–0.9)
MONO%: 9.1 % (ref 0.0–14.0)
NEUT#: 3.7 10*3/uL (ref 1.5–6.5)
NEUT%: 50.8 % (ref 38.4–76.8)
Platelets: 57 10*3/uL — ABNORMAL LOW (ref 145–400)
RBC: 4.72 10*6/uL (ref 3.70–5.45)
RDW: 13.5 % (ref 11.2–14.5)
WBC: 7.4 10*3/uL (ref 3.9–10.3)
lymph#: 2.4 10*3/uL (ref 0.9–3.3)
nRBC: 0 % (ref 0–0)

## 2015-01-04 LAB — TECHNOLOGIST REVIEW

## 2015-01-04 LAB — CHCC SMEAR

## 2015-01-04 NOTE — Progress Notes (Signed)
Checked in new pt with no financial concerns.  Pt has 2 insurances so financial assistance may not be needed.  ° °

## 2015-01-04 NOTE — Progress Notes (Signed)
Please see consult note.  

## 2015-01-04 NOTE — Telephone Encounter (Signed)
Pt confirmed labs/ov per 05/25 POF, gave pt AVS and Calendar.... KJ °

## 2015-01-04 NOTE — Consult Note (Signed)
Reason for Referral: Thrombocytopenia.   HPI: Jocelyn [name] is an 81 year old woman currently of [place] where she lived the majority of her life. She is a rather healthy woman without any significant comorbid conditions. She has history of hypertension and hyperlipidemia but for the most part in generally good health. She was noted to have thrombocytopenia on a CBC done on May 2 of 2016 or her platelet count was 36,000. Her white cell count was 8.9 with a hemoglobin of 14 7. She had normal chemistries and normal liver function test. A repeat a CBC on May 3, showed a platelet count 37,000. Previous CBCs dating back to 2014 showed a platelet count of 66,000 back in October 2014. Previous to that, her platelet count have been normal. She is asymptomatic at this time. She does not report any bleeding including epistaxis, hematochezia, melanoma, hemoptysis or GYN bleeding. She does report occasional bruising but that have not change since you started low-dose aspirin many years ago. She does not report any constitutional symptoms of weight loss or appetite changes. She has been diagnosed with an upper respiratory tract infection and finished and a mitotic course 3 weeks ago. She continues to be in excellent health and had an cataract operation without any postoperative complications. She continues to perform activities of daily living without any decline. She is able to drive and attends to her household needs.  She does not report any headaches, blurry vision she does report some visual problems related to her cataracts without any syncope or seizures. She does not report any fevers, chills, sweats or change in her energy. She does not report any chest pain, palpitation, orthopnea or leg edema. She does not report any cough or hemoptysis or hematemesis. She does not report any wheezing or shortness of breath. He does not report any nausea, vomiting, abdominal pain, hematochezia, melanoma, satiety or change in  her bowel habits. She does not report any frequency, urgency or hesitancy. She does not report any skeletal complaints. Remaining review of systems unremarkable.   Past Medical History  Diagnosis Date  . HTN (hypertension)   . Urinary incontinence   . Hyperlipidemia   :  Past Surgical History  Procedure Laterality Date  . Abdominal hysterectomy    . Tonsillectomy    :   Current outpatient prescriptions:  .  aspirin 81 MG tablet, Take 81 mg by mouth daily., Disp: , Rfl:  .  b complex vitamins tablet, Take 1 tablet by mouth daily., Disp: , Rfl:  .  CRANBERRY CONCENTRATE PO, Take 1 tablet by mouth daily., Disp: , Rfl:  .  LORazepam (ATIVAN) 0.5 MG tablet, Take 1 tablet (0.5 mg total) by mouth 2 (two) times daily as needed for anxiety., Disp: 60 tablet, Rfl: 3 .  metoprolol tartrate (LOPRESSOR) 25 MG tablet, TAKE ONE TABLET BY MOUTH TWICE DAILY, Disp: 180 tablet, Rfl: 3 .  Misc Natural Products (BUTCHERS BROOM PO), Take 1 tablet by mouth daily., Disp: , Rfl:  .  Misc Natural Products (TART CHERRY ADVANCED PO), Take 1 capsule by mouth daily., Disp: , Rfl:  .  Multiple Vitamins-Minerals (MULTIVITAMIN,TX-MINERALS) tablet, Take 1 tablet by mouth daily.  , Disp: , Rfl:  .  Omega-3 Fatty Acids (FISH OIL PO), Take 1 capsule by mouth daily., Disp: , Rfl:  .  promethazine-dextromethorphan (PROMETHAZINE-DM) 6.25-15 MG/5ML syrup, Take 5 mLs by mouth 4 (four) times daily as needed for cough., Disp: 118 mL, Rfl: 0 .  Vitamins-Lipotropics (SUPER STRESS B-COMPLEX CR PO), Take  1 tablet by mouth daily., Disp: , Rfl: :  Allergies  Allergen Reactions  . Codeine     REACTION: nausea, itching  :  Family History  Problem Relation Age of Onset  . Diabetes      1st degree relative  . Hyperlipidemia    . Stroke      1st degree Female relative <50  . Cancer Neg Hx   . Early death Neg Hx   . Heart disease Neg Hx   . Hypertension Neg Hx   . Kidney disease Neg Hx   :  History   Social History  .  Marital Status: Married    Spouse Name: N/A  . Number of Children: N/A  . Years of Education: N/A   Occupational History  . Retired    Social History Main Topics  . Smoking status: Former Research scientist (life sciences)  . Smokeless tobacco: Never Used  . Alcohol Use: 8.4 oz/week    7 Glasses of wine, 7 Cans of beer per week  . Drug Use: No  . Sexual Activity: Yes    Birth Control/ Protection: Surgical   Other Topics Concern  . Not on file   Social History Narrative   Regular Exercise-no        :  Pertinent items are noted in HPI.  Exam: ECOG 0 Blood pressure 144/80, pulse 56, temperature 97.5 F (36.4 C), temperature source Oral, resp. rate 18, height _0  (1.676 m), weight 174 lb 4.8 oz (79.062 kg), SpO2 98 %. General appearance: alert and cooperative Head: Normocephalic, without obvious abnormality Throat: lips, mucosa, and tongue normal; teeth and gums normal Neck: no adenopathy Back: negative Resp: clear to auscultation bilaterally Chest wall: no tenderness Cardio: regular rate and rhythm, S1, S2 normal, no murmur, click, rub or gallop GI: soft, non-tender; bowel sounds normal; no masses,  no organomegaly Extremities: extremities normal, atraumatic, no cyanosis or edema Pulses: 2+ and symmetric Skin: Skin color, texture, turgor normal. No rashes or lesions   Recent Labs  01/04/15 1019  WBC 7.4  HGB 14.8  HCT 43.2  PLT 57*   Peripheral smear was personally reviewed today and showed decreased number of platelets but certainly increased in size. Giant platelets noted as well. The red cells appeared normal without any schistocytes or red cell fragmentation. White cells appeared normal without any dysplasia.   Assessment and Plan:   81 year old woman with the following issues:  1. Thrombocytopenia with a rather normal peripheral smear. The differential diagnosis was discussed with the patient and her husband. Her platelets today are slightly better than they were 3 weeks ago and  consistent with the same count where she was in 2014. Pro time have fluctuated between 50 and 60,000 and the last 2 years. She has normal white cell count and normal hemoglobin. She has normal kidney function and liver function tests. She has no evidence of schistocytes.  The differential diagnosis includes immune thrombocytopenia, reactive thrombocytopenia due to medication, myelodysplastic syndrome among others. Condition such as TTP, HUS, DIC, HIT are extremely unlikely at this time.  From a management standpoint, I see no need for a an acute intervention at this time. She does not have any bleeding or excessive bruising and her platelet count above 50,000. I recommended continued observation and consider starts or IVIG of her platelet counts drops below 30 or she develops bleeding.  She develops any other cytopenias, a bone marrow biopsy would be necessary to rule out myelodysplastic syndrome.  I've asked her  to return in about 3 months to repeat her laboratory testing. I also gave her clear instructions to report to me any signs or symptoms of bruising or bleeding.  2. Slight fatigue: Unrelated to her thrombocytopenia at this time.  3. Bleeding prophylaxis: She should be able to undergo most surgeries with her current platelet count without any intervention. Neurosurgical intervention might require higher platelet count and we'll have to boost her counts if needed to if she is to ever have a surgery on her spine.

## 2015-01-11 DIAGNOSIS — H25811 Combined forms of age-related cataract, right eye: Secondary | ICD-10-CM | POA: Diagnosis not present

## 2015-01-11 DIAGNOSIS — H2511 Age-related nuclear cataract, right eye: Secondary | ICD-10-CM | POA: Diagnosis not present

## 2015-03-28 ENCOUNTER — Ambulatory Visit (HOSPITAL_BASED_OUTPATIENT_CLINIC_OR_DEPARTMENT_OTHER): Payer: Medicare Other | Admitting: Oncology

## 2015-03-28 ENCOUNTER — Other Ambulatory Visit (HOSPITAL_BASED_OUTPATIENT_CLINIC_OR_DEPARTMENT_OTHER): Payer: Medicare Other

## 2015-03-28 ENCOUNTER — Telehealth: Payer: Self-pay | Admitting: Oncology

## 2015-03-28 VITALS — BP 142/73 | HR 18 | Temp 97.6°F | Resp 18 | Ht 66.0 in | Wt 178.2 lb

## 2015-03-28 DIAGNOSIS — R5383 Other fatigue: Secondary | ICD-10-CM | POA: Diagnosis not present

## 2015-03-28 DIAGNOSIS — D696 Thrombocytopenia, unspecified: Secondary | ICD-10-CM

## 2015-03-28 LAB — CBC WITH DIFFERENTIAL/PLATELET
BASO%: 1.6 % (ref 0.0–2.0)
Basophils Absolute: 0.1 10*3/uL (ref 0.0–0.1)
EOS%: 6.6 % (ref 0.0–7.0)
Eosinophils Absolute: 0.5 10*3/uL (ref 0.0–0.5)
HCT: 48 % — ABNORMAL HIGH (ref 34.8–46.6)
HGB: 16.2 g/dL — ABNORMAL HIGH (ref 11.6–15.9)
LYMPH%: 27.3 % (ref 14.0–49.7)
MCH: 30.7 pg (ref 25.1–34.0)
MCHC: 33.7 g/dL (ref 31.5–36.0)
MCV: 91.2 fL (ref 79.5–101.0)
MONO#: 0.5 10*3/uL (ref 0.1–0.9)
MONO%: 6.9 % (ref 0.0–14.0)
NEUT#: 4.4 10*3/uL (ref 1.5–6.5)
NEUT%: 57.6 % (ref 38.4–76.8)
Platelets: 69 10*3/uL — ABNORMAL LOW (ref 145–400)
RBC: 5.26 10*6/uL (ref 3.70–5.45)
RDW: 12.8 % (ref 11.2–14.5)
WBC: 7.7 10*3/uL (ref 3.9–10.3)
lymph#: 2.1 10*3/uL (ref 0.9–3.3)

## 2015-03-28 LAB — COMPREHENSIVE METABOLIC PANEL (CC13)
ALT: 14 U/L (ref 0–55)
AST: 14 U/L (ref 5–34)
Albumin: 3.8 g/dL (ref 3.5–5.0)
Alkaline Phosphatase: 55 U/L (ref 40–150)
Anion Gap: 8 mEq/L (ref 3–11)
BUN: 10.8 mg/dL (ref 7.0–26.0)
CO2: 21 mEq/L — ABNORMAL LOW (ref 22–29)
Calcium: 9.9 mg/dL (ref 8.4–10.4)
Chloride: 111 mEq/L — ABNORMAL HIGH (ref 98–109)
Creatinine: 1.1 mg/dL (ref 0.6–1.1)
EGFR: 47 mL/min/{1.73_m2} — ABNORMAL LOW (ref >90)
Glucose: 130 mg/dl (ref 70–140)
Potassium: 4.2 mEq/L (ref 3.5–5.1)
Sodium: 141 mEq/L (ref 136–145)
Total Bilirubin: 0.97 mg/dL (ref 0.20–1.20)
Total Protein: 6.5 g/dL (ref 6.4–8.3)

## 2015-03-28 LAB — CHCC SMEAR

## 2015-03-28 NOTE — Telephone Encounter (Signed)
Gave patient avs report and appointments for December  °

## 2015-03-28 NOTE — Progress Notes (Signed)
Hematology and Oncology Follow Up Visit  Jocelyn [name] [id number] May 25, 1932 81 y.o. 03/28/2015 9:18 AM   Principle Diagnosis: 81 year old woman with thrombocytopenia diagnosed in May 2016 with a platelet count close to 50,000. The differential diagnosis included ITP, reactive findings and less likely MDS.   Current therapy: Observation and surveillance.  Interim History: [name] presents today for a follow-up visit with her husband. Since the last visit, she has no complaints. She denied any bleeding complications such as epistaxis, hematochezia, hematuria or petechiae. She does report occasional ecchymoses related to intake of aspirin. She has not reported any hospitalization or illnesses. She continues to perform activities of daily living without any decline. She has no constitutional symptoms.   She does not report any headaches, blurry vision she does report some visual problems related to her cataracts without any syncope or seizures. She does not report any fevers, chills, sweats or change in her energy. She does not report any chest pain, palpitation, orthopnea or leg edema. She does not report any cough or hemoptysis or hematemesis. She does not report any wheezing or shortness of breath. He does not report any nausea, vomiting, abdominal pain, hematochezia, melanoma, satiety or change in her bowel habits. She does not report any frequency, urgency or hesitancy. She does not report any skeletal complaints. Remaining review of systems unremarkable.   Medications: I have reviewed the patient's current medications.  Current Outpatient Prescriptions  Medication Sig Dispense Refill  . aspirin 81 MG tablet Take 81 mg by mouth daily.    Marland Kitchen b complex vitamins tablet Take 1 tablet by mouth daily.    Marland Kitchen CRANBERRY CONCENTRATE PO Take 1 tablet by mouth daily.    Marland Kitchen LORazepam (ATIVAN) 0.5 MG tablet Take 1 tablet (0.5 mg total) by mouth 2 (two) times daily as needed for anxiety. 60 tablet 3  .  metoprolol tartrate (LOPRESSOR) 25 MG tablet TAKE ONE TABLET BY MOUTH TWICE DAILY 180 tablet 3  . Misc Natural Products (BUTCHERS BROOM PO) Take 1 tablet by mouth daily.    . Misc Natural Products (TART CHERRY ADVANCED PO) Take 1 capsule by mouth daily.    . Multiple Vitamins-Minerals (MULTIVITAMIN,TX-MINERALS) tablet Take 1 tablet by mouth daily.      . Omega-3 Fatty Acids (FISH OIL PO) Take 1 capsule by mouth daily.    . promethazine-dextromethorphan (PROMETHAZINE-DM) 6.25-15 MG/5ML syrup Take 5 mLs by mouth 4 (four) times daily as needed for cough. 118 mL 0  . Vitamins-Lipotropics (SUPER STRESS B-COMPLEX CR PO) Take 1 tablet by mouth daily.     No current facility-administered medications for this visit.     Allergies:  Allergies  Allergen Reactions  . Codeine     REACTION: nausea, itching    Past Medical History, Surgical history, Social history, and Family History were reviewed and updated.   Physical Exam: Blood pressure 142/73, pulse 18, temperature 97.6 F (36.4 C), temperature source Oral, resp. rate 18, height _0  (1.676 m), weight 178 lb 3.2 oz (80.831 kg), SpO2 95 %. ECOG: 0 General appearance: alert and cooperative Head: Normocephalic, without obvious abnormality Neck: no adenopathy Lymph nodes: Cervical, supraclavicular, and axillary nodes normal. Heart:regular rate and rhythm, S1, S2 normal, no murmur, click, rub or gallop Lung:chest clear, no wheezing, rales, normal symmetric air entry Abdomin: soft, non-tender, without masses or organomegaly EXT:no erythema, induration, or nodules   Lab Results: Lab Results  Component Value Date   WBC 7.7 03/28/2015   HGB 16.2* 03/28/2015   HCT  48.0* 03/28/2015   MCV 91.2 03/28/2015   PLT 69* 03/28/2015     Chemistry      Component Value Date/Time   NA 138 12/12/2014 1131   K 4.1 12/12/2014 1131   CL 105 12/12/2014 1131   CO2 27 12/12/2014 1131   BUN 7 12/12/2014 1131   CREATININE 0.96 12/12/2014 1131       Component Value Date/Time   CALCIUM 10.0 12/12/2014 1131   ALKPHOS 52 12/12/2014 1131   AST 16 12/12/2014 1131   ALT 14 12/12/2014 1131   BILITOT 0.7 12/12/2014 1131       Impression and Plan:  81 year old woman with the following issues:  1. Thrombocytopenia with a normal peripheral smear. The differential diagnosis includes ITP, reactive findings versus early MDS.  Condition such as TTP, HUS, DIC, HIT are extremely unlikely at this time.  Her platelet count today actually improving up to 69 from as low as 37 back in May 2016. She has no signs or symptoms of bleeding.  From a management standpoint, I see no need for a an acute intervention at this time. She does not have any bleeding or excessive bruising and her platelet count above 50,000. I recommended continued observation and consider starts or IVIG of her platelet counts drops below 30 or she develops bleeding.  She develops any other cytopenias, a bone marrow biopsy would be necessary to rule out myelodysplastic syndrome.  2. Slight fatigue: Unrelated to her thrombocytopenia at this time. This is improved since the last visit.  3. Bleeding prophylaxis: She should be able to undergo most surgeries with her current platelet count without any intervention. Neurosurgical intervention might require higher platelet count and we'll have to boost her counts if needed to if she is to ever have a surgery on her spine.  4. Follow-up: Will be in 4 months.    [facility], MD 8/16/20169:18 AM

## 2015-04-06 DIAGNOSIS — H524 Presbyopia: Secondary | ICD-10-CM | POA: Diagnosis not present

## 2015-04-06 DIAGNOSIS — H26493 Other secondary cataract, bilateral: Secondary | ICD-10-CM | POA: Diagnosis not present

## 2015-05-18 DIAGNOSIS — H26493 Other secondary cataract, bilateral: Secondary | ICD-10-CM | POA: Diagnosis not present

## 2015-06-05 ENCOUNTER — Encounter: Payer: Self-pay | Admitting: *Deleted

## 2015-06-19 DIAGNOSIS — H26492 Other secondary cataract, left eye: Secondary | ICD-10-CM | POA: Diagnosis not present

## 2015-06-26 DIAGNOSIS — Z23 Encounter for immunization: Secondary | ICD-10-CM | POA: Diagnosis not present

## 2015-07-26 ENCOUNTER — Ambulatory Visit: Payer: Medicare Other | Admitting: Oncology

## 2015-07-26 ENCOUNTER — Other Ambulatory Visit: Payer: Medicare Other

## 2015-08-02 ENCOUNTER — Ambulatory Visit (INDEPENDENT_AMBULATORY_CARE_PROVIDER_SITE_OTHER): Payer: Medicare Other | Admitting: Family

## 2015-08-02 ENCOUNTER — Encounter: Payer: Self-pay | Admitting: Family

## 2015-08-02 VITALS — BP 148/92 | HR 108 | Temp 98.3°F | Resp 18 | Ht 66.0 in | Wt 179.0 lb

## 2015-08-02 DIAGNOSIS — J209 Acute bronchitis, unspecified: Secondary | ICD-10-CM

## 2015-08-02 MED ORDER — AZITHROMYCIN 250 MG PO TABS: ORAL_TABLET | ORAL | Status: DC

## 2015-08-02 MED ORDER — PROMETHAZINE-DM 6.25-15 MG/5ML PO SYRP: 5.0000 mL | ORAL_SOLUTION | Freq: Four times a day (QID) | ORAL | Status: DC | PRN

## 2015-08-02 NOTE — Assessment & Plan Note (Signed)
Symptoms and exam consistent with bronchitis. Start azithromycin. Start promethazine DM as needed for cough and sleep. Continue over-the-counter medications as needed for symptom relief and supportive care. Follow-up if symptoms worsen or fail to improve.

## 2015-08-02 NOTE — Progress Notes (Signed)
Pre visit review using our clinic review tool, if applicable. No additional management support is needed unless otherwise documented below in the visit note. 

## 2015-08-02 NOTE — Patient Instructions (Signed)
Thank you for choosing [facility].  Summary/Instructions:  Your prescription(s) have been submitted to your pharmacy or been printed and provided for you. Please take as directed and contact our office if you believe you are having problem(s) with the medication(s) or have any questions.  If your symptoms worsen or fail to improve, please contact our office for further instruction, or in case of emergency go directly to the emergency room at the closest medical facility.   General Recommendations:    Please drink plenty of fluids.  Get plenty of rest   Sleep in humidified air  Use saline nasal sprays  Netti pot   OTC Medications:  Decongestants - helps relieve congestion   Flonase (generic fluticasone) or Nasacort (generic triamcinolone) - please make sure to use the "cross-over" technique at a 45 degree angle towards the opposite eye as opposed to straight up the nasal passageway.   Sudafed (generic pseudoephedrine - Note this is the one that is available behind the pharmacy counter); Products with phenylephrine (-PE) may also be used but is often not as effective as pseudoephedrine.   If you have HIGH BLOOD PRESSURE - Coricidin HBP; AVOID any product that is -D as this contains pseudoephedrine which may increase your blood pressure.  Afrin (oxymetazoline) every 6-8 hours for up to 3 days.   Allergies - helps relieve runny nose, itchy eyes and sneezing   Claritin (generic loratidine), Allegra (fexofenidine), or Zyrtec (generic cyrterizine) for runny nose. These medications should not cause drowsiness.  Note - Benadryl (generic diphenhydramine) may be used however may cause drowsiness  Cough -   Delsym or Robitussin (generic dextromethorphan)  Expectorants - helps loosen mucus to ease removal   Mucinex (generic guaifenesin) as directed on the package.  Headaches / General Aches   Tylenol (generic acetaminophen) - DO NOT EXCEED 3 grams (3,000 mg) in a 24  hour time period  Advil/Motrin (generic ibuprofen)   Sore Throat -   Salt water gargle   Chloraseptic (generic benzocaine) spray or lozenges / Sucrets (generic dyclonine)      

## 2015-08-02 NOTE — Progress Notes (Signed)
Subjective:    Patient ID: Jocelyn [name], female    DOB: 1931/12/16, 81 y.o.   MRN: [id number]  Chief Complaint  Patient presents with  . Cough    x1 week, productive cough, pain in abdomen she thinks is related to coughing alot     HPI:  Jocelyn [name] is a 81 y.o. female who  has a past medical history of HTN (hypertension); Urinary incontinence; and Hyperlipidemia. and presents today for an acute office visit.  This is a new problem. Associated symptoms of productive cough, tinnitus, and pain located in her abdomen that is possibly related coughing as been going on for approximately one week. Modifying factors include Alkaseltzer plus which helped a little with her symptoms. Severity of the cough is enough to effect her sleep.  Denies fever. Denies recent antibiotic use.   Allergies  Allergen Reactions  . Codeine     REACTION: nausea, itching     Current Outpatient Prescriptions on File Prior to Visit  Medication Sig Dispense Refill  . aspirin 81 MG tablet Take 81 mg by mouth daily.    Marland Kitchen b complex vitamins tablet Take 1 tablet by mouth daily.    Marland Kitchen CRANBERRY CONCENTRATE PO Take 1 tablet by mouth daily.    Marland Kitchen LORazepam (ATIVAN) 0.5 MG tablet Take 1 tablet (0.5 mg total) by mouth 2 (two) times daily as needed for anxiety. 60 tablet 3  . metoprolol tartrate (LOPRESSOR) 25 MG tablet TAKE ONE TABLET BY MOUTH TWICE DAILY 180 tablet 3  . Misc Natural Products (BUTCHERS BROOM PO) Take 1 tablet by mouth daily.    . Misc Natural Products (TART CHERRY ADVANCED PO) Take 1 capsule by mouth daily.    . Multiple Vitamins-Minerals (MULTIVITAMIN,TX-MINERALS) tablet Take 1 tablet by mouth daily.      . Omega-3 Fatty Acids (FISH OIL PO) Take 1 capsule by mouth daily.    . Vitamins-Lipotropics (SUPER STRESS B-COMPLEX CR PO) Take 1 tablet by mouth daily.     No current facility-administered medications on file prior to visit.    Review of Systems  Constitutional: Negative for fever and chills.    HENT: Positive for congestion, sore throat and tinnitus. Negative for sinus pressure.   Respiratory: Positive for cough. Negative for chest tightness and shortness of breath.   Neurological: Positive for headaches.      Objective:    BP 148/92 mmHg  Pulse 108  Temp(Src) 98.3 F (36.8 C) (Oral)  Resp 18  Ht 5\' 6"  (1.676 m)  Wt 179 lb (81.194 kg)  BMI 28.91 kg/m2  SpO2 96% Nursing note and vital signs reviewed.  Physical Exam  Constitutional: She is oriented to person, place, and time. She appears well-developed and well-nourished.  Non-toxic appearance. She does not have a sickly appearance. She does not appear ill. No distress.  HENT:  Right Ear: Hearing, tympanic membrane, external ear and ear canal normal.  Left Ear: Hearing, tympanic membrane, external ear and ear canal normal.  Nose: Right sinus exhibits no maxillary sinus tenderness and no frontal sinus tenderness. Left sinus exhibits no maxillary sinus tenderness and no frontal sinus tenderness.  Mouth/Throat: Uvula is midline, oropharynx is clear and moist and mucous membranes are normal.  Cardiovascular: Normal rate, regular rhythm, normal heart sounds and intact distal pulses.   Pulmonary/Chest: Effort normal and breath sounds normal.  Neurological: She is alert and oriented to person, place, and time.  Skin: Skin is warm and dry.  Psychiatric: She has a normal  mood and affect. Her behavior is normal. Judgment and thought content normal.       Assessment & Plan:   Problem List Items Addressed This Visit      Respiratory   Acute bronchitis - Primary    Symptoms and exam consistent with bronchitis. Start azithromycin. Start promethazine DM as needed for cough and sleep. Continue over-the-counter medications as needed for symptom relief and supportive care. Follow-up if symptoms worsen or fail to improve.      Relevant Medications   azithromycin (ZITHROMAX) 250 MG tablet   promethazine-dextromethorphan  (PROMETHAZINE-DM) 6.25-15 MG/5ML syrup

## 2015-09-05 DIAGNOSIS — D485 Neoplasm of uncertain behavior of skin: Secondary | ICD-10-CM | POA: Diagnosis not present

## 2015-09-05 DIAGNOSIS — D045 Carcinoma in situ of skin of trunk: Secondary | ICD-10-CM | POA: Diagnosis not present

## 2015-09-05 DIAGNOSIS — D225 Melanocytic nevi of trunk: Secondary | ICD-10-CM | POA: Diagnosis not present

## 2015-09-05 DIAGNOSIS — L821 Other seborrheic keratosis: Secondary | ICD-10-CM | POA: Diagnosis not present

## 2015-09-09 ENCOUNTER — Encounter: Payer: Self-pay | Admitting: Primary Care

## 2015-09-09 ENCOUNTER — Ambulatory Visit (INDEPENDENT_AMBULATORY_CARE_PROVIDER_SITE_OTHER): Payer: Medicare Other | Admitting: Primary Care

## 2015-09-09 VITALS — BP 124/80 | HR 75 | Temp 97.7°F | Wt 178.0 lb

## 2015-09-09 DIAGNOSIS — T148XXA Other injury of unspecified body region, initial encounter: Secondary | ICD-10-CM

## 2015-09-09 DIAGNOSIS — T148 Other injury of unspecified body region: Secondary | ICD-10-CM | POA: Diagnosis not present

## 2015-09-09 NOTE — Progress Notes (Signed)
Subjective:    Patient ID: Jocelyn [name], female    DOB: [birth year], 82 y.o.   MRN: [id number]  HPI  Jocelyn [name] is an 82 year old female who presents today with a chief complaint of lower extremity swelling. Her swelling is located to the left lower calf. She first noticed her swelling last Sunday. She was packing up her Christmas ornaments, rose from a sitting position, and noticed a sudden "pop" and pull to her calf. She's applied heating pads and taking aspirin with some improvement. Her pain is worse when standing and ambulating. Denies redness, fevers, pitting edema.   Review of Systems  Constitutional: Negative for fever and chills.  Respiratory: Negative for shortness of breath.   Cardiovascular: Negative for chest pain.  Musculoskeletal: Positive for myalgias.       Past Medical History  Diagnosis Date  . HTN (hypertension)   . Urinary incontinence   . Hyperlipidemia     Social History   Social History  . Marital Status: Married    Spouse Name: N/A  . Number of Children: N/A  . Years of Education: N/A   Occupational History  . Retired    Social History Main Topics  . Smoking status: Former Research scientist (life sciences)  . Smokeless tobacco: Never Used  . Alcohol Use: 8.4 oz/week    7 Glasses of wine, 7 Cans of beer per week  . Drug Use: No  . Sexual Activity: Yes    Birth Control/ Protection: Surgical   Other Topics Concern  . Not on file   Social History Narrative   Regular Exercise-no          Past Surgical History  Procedure Laterality Date  . Abdominal hysterectomy    . Tonsillectomy      Family History  Problem Relation Age of Onset  . Diabetes      1st degree relative  . Hyperlipidemia    . Stroke      1st degree Female relative <50  . Cancer Neg Hx   . Early death Neg Hx   . Heart disease Neg Hx   . Hypertension Neg Hx   . Kidney disease Neg Hx     Allergies  Allergen Reactions  . Codeine     REACTION: nausea, itching    Current Outpatient  Prescriptions on File Prior to Visit  Medication Sig Dispense Refill  . aspirin 81 MG tablet Take 81 mg by mouth daily.    Marland Kitchen b complex vitamins tablet Take 1 tablet by mouth daily.    Marland Kitchen CRANBERRY CONCENTRATE PO Take 1 tablet by mouth daily.    . metoprolol tartrate (LOPRESSOR) 25 MG tablet TAKE ONE TABLET BY MOUTH TWICE DAILY 180 tablet 3  . Misc Natural Products (BUTCHERS BROOM PO) Take 1 tablet by mouth daily.    . Misc Natural Products (TART CHERRY ADVANCED PO) Take 1 capsule by mouth daily.    . Multiple Vitamins-Minerals (MULTIVITAMIN,TX-MINERALS) tablet Take 1 tablet by mouth daily.      . Omega-3 Fatty Acids (FISH OIL PO) Take 1 capsule by mouth daily.    . Vitamins-Lipotropics (SUPER STRESS B-COMPLEX CR PO) Take 1 tablet by mouth daily.    Marland Kitchen azithromycin (ZITHROMAX) 250 MG tablet Take 2 tablets by mouth for 1 day and then 1 tablet daily for 4 days (Patient not taking: Reported on 09/09/2015) 6 tablet 0  . LORazepam (ATIVAN) 0.5 MG tablet Take 1 tablet (0.5 mg total) by mouth 2 (two) times daily as  needed for anxiety. (Patient not taking: Reported on 09/09/2015) 60 tablet 3  . promethazine-dextromethorphan (PROMETHAZINE-DM) 6.25-15 MG/5ML syrup Take 5 mLs by mouth 4 (four) times daily as needed for cough. (Patient not taking: Reported on 09/09/2015) 118 mL 0   No current facility-administered medications on file prior to visit.    BP 124/80 mmHg  Pulse 75  Temp(Src) 97.7 F (36.5 C)  Wt 178 lb (80.74 kg)    Objective:   Physical Exam  Constitutional: She appears well-nourished.  Cardiovascular: Normal rate and regular rhythm.   Pulmonary/Chest: Effort normal and breath sounds normal.  Musculoskeletal:       Left lower leg: She exhibits tenderness and swelling.  Pain with dorsoflexion.  Skin: Skin is warm and dry. No erythema.          Assessment & Plan:  Gastrocnemius Muscle Tear:  Sudden popping sensation with pain to left calf when raising from sitting position 1  week ago. Exam with moderate swelling to calf with tenderness. Pain with dorsoflexion mostly. No erythema, recent long travel, chest pain. Suspect muscle tear to gastrocnemius muscle. Suspicion low for DVT, although history would place her at risk. Will proceed with supportive treatment for muscle tear. Ice, rest, stretching, aspirin PRN.  Return precautions provided.

## 2015-09-09 NOTE — Patient Instructions (Signed)
Your swelling related to a partial muscle tear and is not likely related to a blood clot.  Ensure you are stretching your calf, apply ice for inflammation, take ibuprofen 600 mg three times daily as needed for inflammation and pain.   This will take several weeks to completely heel. Please follow up with PCP if no improvement in 4 weeks.  It was a pleasure meeting you!

## 2015-09-11 DIAGNOSIS — D045 Carcinoma in situ of skin of trunk: Secondary | ICD-10-CM | POA: Diagnosis not present

## 2016-03-04 ENCOUNTER — Telehealth: Payer: Self-pay

## 2016-03-04 NOTE — Telephone Encounter (Signed)
Incoming call from Ms. Commisso to [employer]. States her spouse has just been dx with Alz; she is ok but would like something for her "nerves". Agreed to come in to see Dr. Ronnald Ramp on the 31st and will complete AWV post visit;

## 2016-03-11 ENCOUNTER — Ambulatory Visit (INDEPENDENT_AMBULATORY_CARE_PROVIDER_SITE_OTHER): Payer: Medicare Other | Admitting: Internal Medicine

## 2016-03-11 VITALS — BP 138/82 | HR 69 | Temp 98.4°F | Resp 16 | Ht 66.0 in | Wt 172.0 lb

## 2016-03-11 DIAGNOSIS — I700 Atherosclerosis of aorta: Secondary | ICD-10-CM

## 2016-03-11 DIAGNOSIS — I10 Essential (primary) hypertension: Secondary | ICD-10-CM | POA: Diagnosis not present

## 2016-03-11 DIAGNOSIS — F418 Other specified anxiety disorders: Secondary | ICD-10-CM | POA: Diagnosis not present

## 2016-03-11 DIAGNOSIS — E785 Hyperlipidemia, unspecified: Secondary | ICD-10-CM

## 2016-03-11 DIAGNOSIS — D696 Thrombocytopenia, unspecified: Secondary | ICD-10-CM

## 2016-03-11 DIAGNOSIS — R918 Other nonspecific abnormal finding of lung field: Secondary | ICD-10-CM

## 2016-03-11 MED ORDER — VORTIOXETINE HBR 5 MG PO TABS: 1.0000 | ORAL_TABLET | Freq: Every day | ORAL | 5 refills | Status: DC

## 2016-03-11 MED ORDER — LORAZEPAM 0.5 MG PO TABS: 0.5000 mg | ORAL_TABLET | Freq: Two times a day (BID) | ORAL | 2 refills | Status: DC | PRN

## 2016-03-11 NOTE — Patient Instructions (Addendum)
[name] , Thank you for taking time to come for your Medicare Wellness Visit. I appreciate your ongoing commitment to your health goals. Please review the following plan we discussed and let me know if I can assist you in the future.   Resources for [employer]: 36 hour day will help understand current issues [email or web address].teepasnow.com stages talks about what you see and why his behavior is what it is and how to respond  Take rest periods;   Try to rally support  Will try [place] 24/7 line to discuss current issues; help problem solve and a helpful guide    These are the goals we discussed: Goals    . patient          Take rest periods as you are able. Jocelyn [name] can review the website Jocelyn [name];  You can also review the information on the Alzheimer's Asso        This is a list of the screening recommended for you and due dates:  Health Maintenance  Topic Date Due  . Pneumonia vaccines (2 of 2 - PPSV23) 06/09/2014  . Flu Shot  03/12/2016  . Tetanus Vaccine  09/20/2020  . DEXA scan (bone density measurement)  Completed  . Shingles Vaccine  Completed      Major Depressive Disorder Major depressive disorder is a mental illness. It also may be called clinical depression or unipolar depression. Major depressive disorder usually causes feelings of sadness, hopelessness, or helplessness. Some people with this disorder do not feel particularly sad but lose interest in doing things they used to enjoy (anhedonia). Major depressive disorder also can cause physical symptoms. It can interfere with work, school, relationships, and other normal everyday activities. The disorder varies in severity but is longer lasting and more serious than the sadness we all feel from time to time in our lives. Major depressive disorder often is triggered by stressful life events or major life changes. Examples of these triggers include divorce, loss of your job or home, a move, and the death of a family member or close  friend. Sometimes this disorder occurs for no obvious reason at all. People who have family members with major depressive disorder or bipolar disorder are at higher risk for developing this disorder, with or without life stressors. Major depressive disorder can occur at any age. It may occur just once in your life (single episode major depressive disorder). It may occur multiple times (recurrent major depressive disorder). SYMPTOMS People with major depressive disorder have either anhedonia or depressed mood on nearly a daily basis for at least 2 weeks or longer. Symptoms of depressed mood include:  Feelings of sadness (blue or down in the dumps) or emptiness.  Feelings of hopelessness or helplessness.  Tearfulness or episodes of crying (may be observed by others).  Irritability (children and adolescents). In addition to depressed mood or anhedonia or both, people with this disorder have at least four of the following symptoms:  Difficulty sleeping or sleeping too much.   Significant change (increase or decrease) in appetite or weight.   Lack of energy or motivation.  Feelings of guilt and worthlessness.   Difficulty concentrating, remembering, or making decisions.  Unusually slow movement (psychomotor retardation) or restlessness (as observed by others).   Recurrent wishes for death, recurrent thoughts of self-harm (suicide), or a suicide attempt. People with major depressive disorder commonly have persistent negative thoughts about themselves, other people, and the world. People with severe major depressive disorder may experiencedistorted beliefs or perceptions  about the world (psychotic delusions). They also may see or hear things that are not real (psychotic hallucinations). DIAGNOSIS Major depressive disorder is diagnosed through an assessment by your health care provider. Your health care provider will ask aboutaspects of your daily life, such as mood,sleep, and appetite, to  see if you have the diagnostic symptoms of major depressive disorder. Your health care provider may ask about your medical history and use of alcohol or drugs, including prescription medicines. Your health care provider also may do a physical exam and blood work. This is because certain medical conditions and the use of certain substances can cause major depressive disorder-like symptoms (secondary depression). Your health care provider also may refer you to a mental health specialist for further evaluation and treatment. TREATMENT It is important to recognize the symptoms of major depressive disorder and seek treatment. The following treatments can be prescribed for this disorder:   Medicine. Antidepressant medicines usually are prescribed. Antidepressant medicines are thought to correct chemical imbalances in the brain that are commonly associated with major depressive disorder. Other types of medicine may be added if the symptoms do not respond to antidepressant medicines alone or if psychotic delusions or hallucinations occur.  Talk therapy. Talk therapy can be helpful in treating major depressive disorder by providing support, education, and guidance. Certain types of talk therapy also can help with negative thinking (cognitive behavioral therapy) and with relationship issues that trigger this disorder (interpersonal therapy). A mental health specialist can help determine which treatment is best for you. Most people with major depressive disorder do well with a combination of medicine and talk therapy. Treatments involving electrical stimulation of the brain can be used in situations with extremely severe symptoms or when medicine and talk therapy do not work over time. These treatments include electroconvulsive therapy, transcranial magnetic stimulation, and vagal nerve stimulation.   This information is not intended to replace advice given to you by your health care provider. Make sure you discuss  any questions you have with your health care provider.   Document Released: 11/23/2012 Document Revised: 08/19/2014 Document Reviewed: 11/23/2012 Elsevier Interactive Patient Education [employer].

## 2016-03-11 NOTE — Progress Notes (Signed)
Subjective:   Jocelyn [name] is a 82 y.o. female who presents for Medicare Annual (Subsequent) preventive examination.   HRA assessment completed during this visit with Jocelyn [name]  The Patient was informed that the wellness visit is to identify future health risk and educate and initiate measures that can reduce risk for increased disease through the lifespan.    NO ROS; Medicare Wellness Visit Last OV:  today  Medical 82 year old woman with thrombocytopenia diagnosed in May 2016 HTN; BP medically managed  Former smoker/  ETOH yes  Lipids trig 166; HDL 53; ratio 3 in 2016 Grand-dtr comes x 2 a week and everyone come on Sunday Comes in [place] today due to stress of caring for spouse with Alz disease   Full time caregiver; watches the soap opera which helps her relax and spouse generally sleeps at this time.  Psychosocial: Spouse starting to have issues;  Recently wrote check for 6000.00/ he thought he was paying to have the driveway Lost child x 2.5 yo due to cancer  Has dtr left and grand-dtr Spouse liked golf; was in safety for awhile; remodeling business; gets frustrated when he can not do this around the home.  He knows he can't drive  Prostate cancer is back as well;  Feeling overwhelmed today;   Jocelyn [name] (grand-dtr)   is the grand dtr and assist with oversight and management; Is a nurse  Has a 34 month old and 80 yo;   loves the grand children;    Medications reviewed for issues; compliance; otc meds by MD  BMI: 27.8 Diet;   Eats healthy and appetite good Eats well and only has Ice cream at hs   Exercise; Now revolves around care of spouse    HOME SAFETY reviewed for short term and long term;  Lives in one level home;  States no plans to move and no plans for placement;  States she had a friend that just placed her spouse but he was very agitated  Understands her plan may change.   Personal safety issues reviewed and states she is not concerned at present but  spouse cannot fix appliances or other at present;  May get an alarm to put on the bedroom door. Referred to the [place] / 24 and 7 line for assistance in dealing with day to day challenges; as well as assistance in developing an activity plan that my help her spouse.   Fall hx; no  UA or BOWEL incontinence; no  Risk for Depression reviewed: Any emotional problems? Anxious, depressed, irritable, sad or blue? Cries a lot;  Sleep  At hs;  Appetite  Is good;  States medication will assist her with the day to day strife;   Denies feeling depressed or hopeless; voices pleasure in daily life  Sleeps well generally; spouse gets up one time to the bathroom but goes back to bed and sleeps   Cognitive; recall is excellent  Helps spouse all the time Manages checkbook, medications; no failures of task Ad8 score reviewed for issues;  Issues making decisions; no  Less interest in hobbies / activities" no  Repeats questions, stories; family complaining: NO  Trouble using ordinary gadgets; microwave; computer: no  Forgets the month or year: no  Mismanaging finances: no  Missing apt: no but does write them down  Daily problems with thinking of memory NO Ad8 score is 0   Advanced Directive addressed; yes  Counseling Health Maintenance Gaps:  Colonoscopy; no and declines  EKG: 09/2010 Mammogram:  08/2011 Dexa/ 05/2013 scan was DECLINED;  PAP: educated regarding the need for GYN exam; 06/2008   Hearing: deferred but no issues voiced   Ophthalmology exam; cataract surgery Eyes were examined x 3 months ago  Immunizations Due: (Vaccines reviewed and educated regarding any overdue)  PSV 23 / declined previously   Established and updated Risk reviewed and appropriate referral made or health recommendations:  Current Care Team reviewed and updated Cardiac Risk Factors include: advanced age (>30men, >25 women)     Objective:     Vitals: BP 138/82   Pulse 69   Temp 98.4 F  (36.9 C) (Oral)   Resp 16   Ht 5\' 6"  (1.676 m)   Wt 172 lb (78 kg)   SpO2 95%   BMI 27.76 kg/m   Body mass index is 27.76 kg/m.   Tobacco History  Smoking Status  . Former Smoker  Smokeless Tobacco  . Never Used     Counseling given: Not Answered   Past Medical History:  Diagnosis Date  . HTN (hypertension)   . Hyperlipidemia   . Urinary incontinence    Past Surgical History:  Procedure Laterality Date  . ABDOMINAL HYSTERECTOMY    . TONSILLECTOMY     Family History  Problem Relation Age of Onset  . Diabetes      1st degree relative  . Hyperlipidemia    . Stroke      1st degree Female relative <50  . Cancer Neg Hx   . Early death Neg Hx   . Heart disease Neg Hx   . Hypertension Neg Hx   . Kidney disease Neg Hx    History  Sexual Activity  . Sexual activity: Yes  . Birth control/ protection: Surgical    Outpatient Encounter Prescriptions as of 03/11/2016  Medication Sig  . aspirin 81 MG tablet Take 81 mg by mouth daily.  Marland Kitchen b complex vitamins tablet Take 1 tablet by mouth daily.  Marland Kitchen LORazepam (ATIVAN) 0.5 MG tablet Take 1 tablet (0.5 mg total) by mouth 2 (two) times daily as needed for anxiety.  . metoprolol tartrate (LOPRESSOR) 25 MG tablet TAKE ONE TABLET BY MOUTH TWICE DAILY  . Omega-3 Fatty Acids (FISH OIL PO) Take 1 capsule by mouth daily.  Marland Kitchen vortioxetine HBr (TRINTELLIX) 5 MG TABS Take 1 tablet (5 mg total) by mouth daily.  . [DISCONTINUED] azithromycin (ZITHROMAX) 250 MG tablet Take 2 tablets by mouth for 1 day and then 1 tablet daily for 4 days (Patient not taking: Reported on 09/09/2015)  . [DISCONTINUED] CRANBERRY CONCENTRATE PO Take 1 tablet by mouth daily.  . [DISCONTINUED] LORazepam (ATIVAN) 0.5 MG tablet Take 1 tablet (0.5 mg total) by mouth 2 (two) times daily as needed for anxiety. (Patient not taking: Reported on 09/09/2015)  . [DISCONTINUED] Misc Natural Products (BUTCHERS BROOM PO) Take 1 tablet by mouth daily.  . [DISCONTINUED] Misc Natural  Products (TART CHERRY ADVANCED PO) Take 1 capsule by mouth daily.  . [DISCONTINUED] Multiple Vitamins-Minerals (MULTIVITAMIN,TX-MINERALS) tablet Take 1 tablet by mouth daily.    . [DISCONTINUED] promethazine-dextromethorphan (PROMETHAZINE-DM) 6.25-15 MG/5ML syrup Take 5 mLs by mouth 4 (four) times daily as needed for cough. (Patient not taking: Reported on 09/09/2015)  . [DISCONTINUED] Vitamins-Lipotropics (SUPER STRESS B-COMPLEX CR PO) Take 1 tablet by mouth daily.   No facility-administered encounter medications on file as of 03/11/2016.     Activities of Daily Living In your present state of health, do you have any difficulty performing the following activities:  03/11/2016  Hearing? N  Vision? N  Difficulty concentrating or making decisions? N  Walking or climbing stairs? N  Dressing or bathing? N  Doing errands, shopping? N  Preparing Food and eating ? N  Using the Toilet? N  In the past six months, have you accidently leaked urine? N  Do you have problems with loss of bowel control? N  Managing your Medications? N  Managing your Finances? N  Housekeeping or managing your Housekeeping? N  Some recent data might be hidden    Patient Care Team: Janith Lima, MD as PCP - General    Assessment:     Exercise Activities and Dietary recommendations     Goals    . patient          Take rest periods as you are able. Eliezer Lofts can review the website [employer];  You can also review the information on the Alzheimer's Asso       Fall Risk Fall Risk  03/11/2016 06/09/2013 06/09/2013  Falls in the past year? No No No   Depression Screen PHQ 2/9 Scores 03/11/2016 06/09/2013 06/09/2013  PHQ - 2 Score 2 0 0  PHQ- 9 Score 4 - -     Cognitive Testing MMSE - Mini Mental State Exam 03/11/2016  Not completed: (No Data)   Ad8 score is 0  Immunization History  Administered Date(s) Administered  . Influenza Whole 05/22/2010, 06/12/2012  . Influenza-Unspecified 05/13/2013  .  Pneumococcal Conjugate-13 06/09/2013  . Td 09/20/2010  . Zoster 09/20/2010   Screening Tests Health Maintenance  Topic Date Due  . PNA vac Low Risk Adult (2 of 2 - PPSV23) 06/09/2014  . INFLUENZA VACCINE  03/12/2016  . TETANUS/TDAP  09/20/2020  . DEXA SCAN  Completed  . ZOSTAVAX  Completed      Plan:   Resources for [place] the grand dtr whom she has deferred to at this time;  36 hour day will help understand current issues [email or web address].teepasnow.com stages talks about what you see and why his behavior is what it is and how to respond  Take rest periods;   Try to rally support/ make new friends   Will try [place] 24/7 line to discuss current issues; help problem solve and a helpful guide   Will take PSV 23 when she comes back to see Dr. Ronnald Ramp and is feeling better Discussed and feels it has been along time since she had it.   During the course of the visit the patient was educated and counseled about the following appropriate screening and preventive services:   Vaccines to include Pneumoccal, Influenza, Hepatitis B, Td, Zostavax, HCV  Electrocardiogram  Cardiovascular Disease  Colorectal cancer screening  Bone density screening  Diabetes screening  Glaucoma screening  Mammography/PAP  Nutrition counseling   Patient Instructions (the written plan) was given to the patient.   Wynetta Fines, RN  03/11/2016

## 2016-03-11 NOTE — Progress Notes (Signed)
Subjective:  Patient ID: Jocelyn [name], female    DOB: 04-27-1932  Age: 82 y.o. MRN: [id number]  CC: Depression   HPI Jocelyn [name] presents for concerns about anxiety and depression. She is taking care of her husband who has dementia and it is taking a toll on her. She complains of crying spells, insomnia with frequent awakening, anxiety and panic attacks. She has a prescription for Ativan from 2 years ago and is taking it intermittently and requests a refill.  Outpatient Medications Prior to Visit  Medication Sig Dispense Refill  . aspirin 81 MG tablet Take 81 mg by mouth daily.    Marland Kitchen b complex vitamins tablet Take 1 tablet by mouth daily.    . metoprolol tartrate (LOPRESSOR) 25 MG tablet TAKE ONE TABLET BY MOUTH TWICE DAILY 180 tablet 3  . Omega-3 Fatty Acids (FISH OIL PO) Take 1 capsule by mouth daily.    Marland Kitchen azithromycin (ZITHROMAX) 250 MG tablet Take 2 tablets by mouth for 1 day and then 1 tablet daily for 4 days (Patient not taking: Reported on 09/09/2015) 6 tablet 0  . CRANBERRY CONCENTRATE PO Take 1 tablet by mouth daily.    Marland Kitchen LORazepam (ATIVAN) 0.5 MG tablet Take 1 tablet (0.5 mg total) by mouth 2 (two) times daily as needed for anxiety. (Patient not taking: Reported on 09/09/2015) 60 tablet 3  . Misc Natural Products (BUTCHERS BROOM PO) Take 1 tablet by mouth daily.    . Misc Natural Products (TART CHERRY ADVANCED PO) Take 1 capsule by mouth daily.    . Multiple Vitamins-Minerals (MULTIVITAMIN,TX-MINERALS) tablet Take 1 tablet by mouth daily.      . promethazine-dextromethorphan (PROMETHAZINE-DM) 6.25-15 MG/5ML syrup Take 5 mLs by mouth 4 (four) times daily as needed for cough. (Patient not taking: Reported on 09/09/2015) 118 mL 0  . Vitamins-Lipotropics (SUPER STRESS B-COMPLEX CR PO) Take 1 tablet by mouth daily.     No facility-administered medications prior to visit.     ROS Review of Systems  Constitutional: Positive for fatigue. Negative for activity change, appetite change and  unexpected weight change.  HENT: Negative.   Eyes: Negative.   Respiratory: Negative.  Negative for cough, choking, chest tightness, shortness of breath and stridor.   Cardiovascular: Negative.  Negative for chest pain, palpitations and leg swelling.  Gastrointestinal: Negative.  Negative for abdominal pain, constipation, diarrhea, nausea and vomiting.  Endocrine: Negative.   Genitourinary: Negative.   Musculoskeletal: Negative.  Negative for back pain, myalgias and neck pain.  Skin: Negative.  Negative for rash.  Allergic/Immunologic: Negative.   Neurological: Negative.  Negative for dizziness.  Hematological: Negative.  Negative for adenopathy. Does not bruise/bleed easily.  Psychiatric/Behavioral: Positive for decreased concentration, dysphoric mood and sleep disturbance. Negative for agitation, behavioral problems, confusion, hallucinations, self-injury and suicidal ideas. The patient is nervous/anxious. The patient is not hyperactive.     Objective:  BP 138/82   Pulse 69   Temp 98.4 F (36.9 C) (Oral)   Resp 16   Ht 5\' 6"  (1.676 m)   Wt 172 lb (78 kg)   SpO2 95%   BMI 27.76 kg/m   BP Readings from Last 3 Encounters:  03/11/16 138/82  09/09/15 124/80  08/02/15 (!) 148/92    Wt Readings from Last 3 Encounters:  03/11/16 172 lb (78 kg)  09/09/15 178 lb (80.7 kg)  08/02/15 179 lb (81.2 kg)    Physical Exam  Constitutional: She is oriented to person, place, and time.  Non-toxic appearance. She  does not have a sickly appearance. She does not appear ill. No distress.  HENT:  Mouth/Throat: Oropharynx is clear and moist. No oropharyngeal exudate.  Eyes: Conjunctivae are normal. Right eye exhibits no discharge. Left eye exhibits no discharge. No scleral icterus.  Neck: Normal range of motion. Neck supple. No JVD present. No tracheal deviation present. No thyromegaly present.  Cardiovascular: Normal rate, regular rhythm, normal heart sounds and intact distal pulses.  Exam  reveals no gallop and no friction rub.   No murmur heard. Pulmonary/Chest: Effort normal and breath sounds normal. No respiratory distress. She has no wheezes. She has no rales. She exhibits no tenderness.  Abdominal: Soft. Bowel sounds are normal. She exhibits no distension and no mass. There is no tenderness. There is no rebound and no guarding.  Musculoskeletal: Normal range of motion. She exhibits no edema, tenderness or deformity.  Lymphadenopathy:    She has no cervical adenopathy.  Neurological: She is oriented to person, place, and time.  Skin: Skin is warm and dry. No rash noted. No erythema. No pallor.  Psychiatric: Her behavior is normal. Judgment and thought content normal. Her mood appears anxious. Her affect is angry. Her speech is not delayed and not tangential. She is not agitated, not aggressive, not slowed and not withdrawn. Cognition and memory are normal. She exhibits a depressed mood. She expresses no homicidal and no suicidal ideation. She expresses no suicidal plans and no homicidal plans.  Crying intermittently She is attentive.    Lab Results  Component Value Date   WBC 7.7 03/28/2015   HGB 16.2 (H) 03/28/2015   HCT 48.0 (H) 03/28/2015   PLT 69 (L) 03/28/2015   GLUCOSE 130 03/28/2015   CHOL 166 12/12/2014   TRIG 166.0 (H) 12/12/2014   HDL 53.70 12/12/2014   LDLDIRECT 128.9 04/08/2011   LDLCALC 79 12/12/2014   ALT 14 03/28/2015   AST 14 03/28/2015   NA 141 03/28/2015   K 4.2 03/28/2015   CL 105 12/12/2014   CREATININE 1.1 03/28/2015   BUN 10.8 03/28/2015   CO2 21 (L) 03/28/2015   TSH 0.81 12/12/2014   HGBA1C 5.5 06/09/2013    Ct Chest Wo Contrast  Result Date: 06/21/2014 CLINICAL DATA:  Lung nodule followup EXAM: CT CHEST WITHOUT CONTRAST TECHNIQUE: Multidetector CT imaging of the chest was performed following the standard protocol without IV contrast. COMPARISON:  CT chest 12/15/2013.  CT chest 09/13/2013 FINDINGS: Right upper lobe nodule measures 7.5  mm and unchanged. This is well circumscribed. No other lung nodule. Mild scarring in the lung bases is stable. No infiltrate or effusion. Coronary artery calcification.  Heart size normal. Ascending aorta is dilated with mild progression. Ascending aorta now measures 41.4 mm at the level of the right pulmonary artery compared with 38.1 cm previously. Mild atherosclerotic calcification in the aortic arch. Upper abdomen is negative.  No acute bony abnormality. IMPRESSION: Right upper lobe nodule is 7.5 mm and unchanged from 09/13/2013. Assuming the patient does not have a known malignancy and is not a smoker, this is felt to be a benign nodule. Progressive dilatation ascending aorta now measuring 40.4 mm. This appears to be atherosclerotic in nature. Electronically Signed   By: Franchot Gallo M.D.   On: 06/21/2014 14:01    Assessment & Plan:   Jocelyn [name] was seen today for depression.  Diagnoses and all orders for this visit:  Thoracic aorta atherosclerosis ([place])  Essential hypertension- her blood pressure is adequately well-controlled  Lung nodules  Dyslipidemia, goal  LDL below 130  Thrombocytopenia ([place])  Depression with anxiety- she will continue lorazepam as needed and I've also asked her to start taking Trintellix, will start a low-dose of 5 mg-I've asked her to follow-up in the next month or 2 will consider a dose increase to 10 may be up to 20 mg a day. -     LORazepam (ATIVAN) 0.5 MG tablet; Take 1 tablet (0.5 mg total) by mouth 2 (two) times daily as needed for anxiety. -     vortioxetine HBr (TRINTELLIX) 5 MG TABS; Take 1 tablet (5 mg total) by mouth daily.   I have discontinued Ms. Kimbley's (multivitamin,tx-minerals), LORazepam, CRANBERRY CONCENTRATE PO, Misc Natural Products (BUTCHERS BROOM PO), Vitamins-Lipotropics (SUPER STRESS B-COMPLEX CR PO), Misc Natural Products (TART CHERRY ADVANCED PO), azithromycin, and promethazine-dextromethorphan. I am also having her start on LORazepam and  vortioxetine HBr. Additionally, I am having her maintain her metoprolol tartrate, aspirin, b complex vitamins, and Omega-3 Fatty Acids (FISH OIL PO).  Meds ordered this encounter  Medications  . LORazepam (ATIVAN) 0.5 MG tablet    Sig: Take 1 tablet (0.5 mg total) by mouth 2 (two) times daily as needed for anxiety.    Dispense:  60 tablet    Refill:  2  . vortioxetine HBr (TRINTELLIX) 5 MG TABS    Sig: Take 1 tablet (5 mg total) by mouth daily.    Dispense:  30 tablet    Refill:  5     Follow-up: Return in about 4 weeks (around 04/08/2016).  Scarlette Calico, MD

## 2016-03-11 NOTE — Progress Notes (Signed)
Pre visit review using our clinic review tool, if applicable. No additional management support is needed unless otherwise documented below in the visit note. 

## 2016-04-08 ENCOUNTER — Ambulatory Visit: Payer: BLUE CROSS/BLUE SHIELD | Admitting: Internal Medicine

## 2016-06-05 DIAGNOSIS — Z23 Encounter for immunization: Secondary | ICD-10-CM | POA: Diagnosis not present

## 2016-06-11 ENCOUNTER — Other Ambulatory Visit: Payer: Self-pay | Admitting: Internal Medicine

## 2017-03-07 ENCOUNTER — Encounter (HOSPITAL_COMMUNITY): Payer: Self-pay | Admitting: *Deleted

## 2017-03-07 ENCOUNTER — Emergency Department (HOSPITAL_COMMUNITY): Payer: Medicare Other

## 2017-03-07 ENCOUNTER — Emergency Department (HOSPITAL_COMMUNITY)
Admission: EM | Admit: 2017-03-07 | Discharge: 2017-03-08 | Disposition: A | Discharge status: Home / Self Care | Payer: Medicare Other | Attending: Emergency Medicine | Admitting: Emergency Medicine

## 2017-03-07 DIAGNOSIS — S61411A Laceration without foreign body of right hand, initial encounter: Secondary | ICD-10-CM | POA: Diagnosis not present

## 2017-03-07 DIAGNOSIS — Z7982 Long term (current) use of aspirin: Secondary | ICD-10-CM | POA: Diagnosis not present

## 2017-03-07 DIAGNOSIS — Y999 Unspecified external cause status: Secondary | ICD-10-CM | POA: Diagnosis not present

## 2017-03-07 DIAGNOSIS — T63483A Toxic effect of venom of other arthropod, assault, initial encounter: Secondary | ICD-10-CM | POA: Diagnosis not present

## 2017-03-07 DIAGNOSIS — M5489 Other dorsalgia: Secondary | ICD-10-CM | POA: Diagnosis not present

## 2017-03-07 DIAGNOSIS — Z79899 Other long term (current) drug therapy: Secondary | ICD-10-CM | POA: Diagnosis not present

## 2017-03-07 DIAGNOSIS — Z87891 Personal history of nicotine dependence: Secondary | ICD-10-CM | POA: Insufficient documentation

## 2017-03-07 DIAGNOSIS — Y93H9 Activity, other involving exterior property and land maintenance, building and construction: Secondary | ICD-10-CM | POA: Insufficient documentation

## 2017-03-07 DIAGNOSIS — T148XXA Other injury of unspecified body region, initial encounter: Secondary | ICD-10-CM | POA: Diagnosis not present

## 2017-03-07 DIAGNOSIS — I10 Essential (primary) hypertension: Secondary | ICD-10-CM | POA: Insufficient documentation

## 2017-03-07 DIAGNOSIS — S01412A Laceration without foreign body of left cheek and temporomandibular area, initial encounter: Secondary | ICD-10-CM | POA: Insufficient documentation

## 2017-03-07 DIAGNOSIS — T63441A Toxic effect of venom of bees, accidental (unintentional), initial encounter: Secondary | ICD-10-CM | POA: Diagnosis not present

## 2017-03-07 DIAGNOSIS — S6992XA Unspecified injury of left wrist, hand and finger(s), initial encounter: Secondary | ICD-10-CM | POA: Diagnosis not present

## 2017-03-07 DIAGNOSIS — J9811 Atelectasis: Secondary | ICD-10-CM | POA: Diagnosis not present

## 2017-03-07 DIAGNOSIS — W010XXA Fall on same level from slipping, tripping and stumbling without subsequent striking against object, initial encounter: Secondary | ICD-10-CM | POA: Diagnosis not present

## 2017-03-07 DIAGNOSIS — S0083XA Contusion of other part of head, initial encounter: Secondary | ICD-10-CM | POA: Diagnosis not present

## 2017-03-07 DIAGNOSIS — M25522 Pain in left elbow: Secondary | ICD-10-CM | POA: Diagnosis not present

## 2017-03-07 DIAGNOSIS — S199XXA Unspecified injury of neck, initial encounter: Secondary | ICD-10-CM | POA: Diagnosis not present

## 2017-03-07 DIAGNOSIS — W19XXXA Unspecified fall, initial encounter: Secondary | ICD-10-CM

## 2017-03-07 DIAGNOSIS — R911 Solitary pulmonary nodule: Secondary | ICD-10-CM | POA: Diagnosis not present

## 2017-03-07 DIAGNOSIS — S61412A Laceration without foreign body of left hand, initial encounter: Secondary | ICD-10-CM | POA: Diagnosis not present

## 2017-03-07 DIAGNOSIS — S0993XA Unspecified injury of face, initial encounter: Secondary | ICD-10-CM | POA: Diagnosis not present

## 2017-03-07 DIAGNOSIS — Y92008 Other place in unspecified non-institutional (private) residence as the place of occurrence of the external cause: Secondary | ICD-10-CM | POA: Diagnosis not present

## 2017-03-07 DIAGNOSIS — S59902A Unspecified injury of left elbow, initial encounter: Secondary | ICD-10-CM | POA: Diagnosis not present

## 2017-03-07 LAB — CBC WITH DIFFERENTIAL/PLATELET
Basophils Absolute: 0.1 10*3/uL (ref 0.0–0.1)
Basophils Relative: 1 %
Eosinophils Absolute: 0.2 10*3/uL (ref 0.0–0.7)
Eosinophils Relative: 3 %
HCT: 39.4 % (ref 36.0–46.0)
Hemoglobin: 13.7 g/dL (ref 12.0–15.0)
Lymphocytes Relative: 28 %
Lymphs Abs: 2.5 10*3/uL (ref 0.7–4.0)
MCH: 30.9 pg (ref 26.0–34.0)
MCHC: 34.8 g/dL (ref 30.0–36.0)
MCV: 88.7 fL (ref 78.0–100.0)
Monocytes Absolute: 0.9 10*3/uL (ref 0.1–1.0)
Monocytes Relative: 10 %
Neutro Abs: 5 10*3/uL (ref 1.7–7.7)
Neutrophils Relative %: 58 %
Platelets: 226 10*3/uL (ref 150–400)
RBC: 4.44 MIL/uL (ref 3.87–5.11)
RDW: 12.8 % (ref 11.5–15.5)
WBC: 8.6 10*3/uL (ref 4.0–10.5)

## 2017-03-07 LAB — BASIC METABOLIC PANEL
Anion gap: 8 (ref 5–15)
BUN: 10 mg/dL (ref 6–20)
CO2: 24 mmol/L (ref 22–32)
Calcium: 10.2 mg/dL (ref 8.9–10.3)
Chloride: 105 mmol/L (ref 101–111)
Creatinine, Ser: 0.97 mg/dL (ref 0.44–1.00)
GFR calc Af Amer: >60 mL/min (ref >60)
GFR calc non Af Amer: 53 mL/min — ABNORMAL LOW (ref >60)
Glucose, Bld: 100 mg/dL — ABNORMAL HIGH (ref 65–99)
Potassium: 4.2 mmol/L (ref 3.5–5.1)
Sodium: 137 mmol/L (ref 135–145)

## 2017-03-07 MED ORDER — LIDOCAINE-EPINEPHRINE-TETRACAINE (LET) SOLUTION
3.0000 mL | Freq: Once | NASAL | Status: AC
  Administered 2017-03-07: 23:00:00 3 mL via TOPICAL
  Filled 2017-03-07: qty 3

## 2017-03-07 MED ORDER — FENTANYL CITRATE (PF) 100 MCG/2ML IJ SOLN
25.0000 ug | Freq: Once | INTRAMUSCULAR | Status: AC
  Administered 2017-03-07: 25 ug via INTRAVENOUS
  Filled 2017-03-07: qty 2

## 2017-03-07 MED ORDER — DIPHENHYDRAMINE HCL 50 MG/ML IJ SOLN
12.5000 mg | Freq: Once | INTRAMUSCULAR | Status: AC
  Administered 2017-03-07: 12.5 mg via INTRAVENOUS
  Filled 2017-03-07: qty 1

## 2017-03-07 MED ORDER — LIDOCAINE HCL 1 % IJ SOLN
INTRAMUSCULAR | Status: AC
  Administered 2017-03-08: 02:00:00
  Filled 2017-03-07: qty 20

## 2017-03-07 MED ORDER — ONDANSETRON HCL 4 MG/2ML IJ SOLN
4.0000 mg | Freq: Once | INTRAMUSCULAR | Status: AC
  Administered 2017-03-07: 4 mg via INTRAVENOUS
  Filled 2017-03-07: qty 2

## 2017-03-07 NOTE — ED Notes (Signed)
Bed: CJ67 Expected date:  Expected time:  Means of arrival:  Comments: EMS 60's yo female-stung by yellowjacket-was running away and fell-lac to hand, pinkie finger and above eyebrow

## 2017-03-07 NOTE — ED Triage Notes (Addendum)
Per EMS, pt was attacked by swarm of yellow jackets, pt fell and hit her left hand and left side of head. Pt has insect bites all over her body. Pt given benadryl via EMS. Pt states she has some itching. Pt denies throat swelling, lung sounds clear, O2 97% on RA.  Pt has laceration to left forehead and left hand

## 2017-03-07 NOTE — ED Provider Notes (Signed)
[facility] DEPT Provider Note   CSN: [id number] Arrival date & time: 03/07/17  2036     History   Chief Complaint Chief Complaint  Patient presents with  . Head Laceration  . Extremity Laceration  . Insect Bite  . Fall    HPI Jocelyn [name] is a 83 y.o. female.  The history is provided by the patient. No language interpreter was used.  Head Laceration  This is a new problem.  Fall     Jocelyn [name] is a 83 y.o. female who presents to the Emergency Department complaining of fall.  She presents from home via EMS after being swarmed by yellow jackets. She was sitting on her porch and had started to drink a beer when she decided to try and cut the [place]. A swab of yellow jackets came out of the naris and she tried to get in her house and fell, landing forward on her face and left arm. She denies any loss of consciousness. She takes a baby aspirin daily. She reports burning sensation in bilateral hands, headache, left knee pain. She was able to ambulate after this happened. She denies any shortness of breath.  Past Medical History:  Diagnosis Date  . HTN (hypertension)   . Hyperlipidemia   . Urinary incontinence     Patient Active Problem List   Diagnosis Date Noted  . Thoracic aorta atherosclerosis ([place]) 12/12/2014  . Lung nodules 08/29/2013  . Thrombocytopenia ([place]) 06/30/2013  . Depression with anxiety 06/09/2013  . Other abnormal glucose 06/09/2013  . Routine general medical examination at a health care facility 06/09/2013  . Dyslipidemia, goal LDL below 130 09/20/2010  . Essential hypertension 12/22/2008    Past Surgical History:  Procedure Laterality Date  . ABDOMINAL HYSTERECTOMY    . TONSILLECTOMY      OB History    No data available       Home Medications    Prior to Admission medications   Medication Sig Start Date End Date Taking? Authorizing Provider  aspirin 81 MG tablet Take 81 mg by mouth daily.    [provider]  b complex  vitamins tablet Take 1 tablet by mouth daily.    [provider]  LORazepam (ATIVAN) 0.5 MG tablet Take 1 tablet (0.5 mg total) by mouth 2 (two) times daily as needed for anxiety. 03/11/16   Janith Lima, MD  metoprolol tartrate (LOPRESSOR) 25 MG tablet TAKE ONE TABLET BY MOUTH TWICE DAILY 06/11/16   Janith Lima, MD  Omega-3 Fatty Acids (FISH OIL PO) Take 1 capsule by mouth daily.    [provider]  vortioxetine HBr (TRINTELLIX) 5 MG TABS Take 1 tablet (5 mg total) by mouth daily. 03/11/16   Janith Lima, MD    Family History Family History  Problem Relation Age of Onset  . Diabetes Unknown        1st degree relative  . Hyperlipidemia Unknown   . Stroke Unknown        1st degree Female relative <50  . Cancer Neg Hx   . Early death Neg Hx   . Heart disease Neg Hx   . Hypertension Neg Hx   . Kidney disease Neg Hx     Social History Social History  Substance Use Topics  . Smoking status: Former Research scientist (life sciences)  . Smokeless tobacco: Never Used  . Alcohol use 8.4 oz/week    7 Glasses of wine, 7 Cans of beer per week     Allergies  Codeine   Review of Systems Review of Systems  All other systems reviewed and are negative.    Physical Exam Updated Vital Signs BP (!) 177/92   Pulse 74   Temp (!) 97.3 F (36.3 C) (Oral)   Resp 18   SpO2 97%   Physical Exam  Constitutional: She is oriented to person, place, and time. She appears well-developed and well-nourished.  HENT:  Head: Normocephalic.  Moderate left periorbital ecchymosis and edema. There is tenderness over the left maxilla. 2 irregular lacerations to the left periorbital region.  Cardiovascular: Normal rate and regular rhythm.   No murmur heard. Pulmonary/Chest: Effort normal and breath sounds normal. No respiratory distress.  Abdominal: Soft. There is no tenderness. There is no rebound and no guarding.  Musculoskeletal:  Tenderness to palpation throughout bilateral hands with  flexion-extension intact throughout bilateral hands. Left hand with a deep laceration along the fifth digit and ulnar aspect of the hand on the dorsal surface. Right hand with a 1 cm laceration to the tip of the third digit and an irregular laceration to the first digit.  Small abrasion to the left knee with local ecchymosis. There is no tenderness to palpation over the knee and flexion extension is intact in the knee. There is no tenderness over the left hip. Mild tenderness over the left elbow with normal ROM  Neurological: She is alert and oriented to person, place, and time.  5 out of 5 strength in all 4 extremities with sensation to light touch intact in all 4 extremities  Skin: Skin is warm and dry.  Psychiatric: She has a normal mood and affect. Her behavior is normal.  Nursing note and vitals reviewed.    ED Treatments / Results  Labs (all labs ordered are listed, but only abnormal results are displayed) Labs Reviewed  CBC WITH DIFFERENTIAL/PLATELET  BASIC METABOLIC PANEL    EKG  EKG Interpretation None       Radiology No results found.  Procedures .Marland KitchenLaceration Repair Date/Time: 03/08/2017 12:52 AM Performed by: Quintella Reichert Authorized by: Quintella Reichert   Consent:    Consent obtained:  Verbal   Consent given by:  Patient   Risks discussed:  Infection, pain, poor cosmetic result and poor wound healing   Alternatives discussed:  No treatment Anesthesia (see MAR for exact dosages):    Anesthesia method:  Topical application and local infiltration   Topical anesthetic:  LET   Local anesthetic:  Lidocaine 1% w/o epi Laceration details:    Location:  Face   Face location:  L cheek   Length (cm):  1 Repair type:    Repair type:  Simple Exploration:    Hemostasis achieved with:  Direct pressure   Wound exploration: wound explored through full range of motion     Contaminated: no   Treatment:    Area cleansed with:  Hibiclens   Amount of cleaning:  Standard    Irrigation solution:  Sterile saline Skin repair:    Repair method:  Sutures   Suture size:  4-0   Suture material:  Prolene   Number of sutures:  2  LACERATION REPAIR Performed by: Quintella Reichert Authorized by: Quintella Reichert Consent: Verbal consent obtained. Risks and benefits: risks, benefits and alternatives were discussed Consent given by: patient Patient identity confirmed: provided demographic data Prepped and Draped in normal sterile fashion Wound explored  Laceration Location: left hand  Laceration Length: 12 cm, complex and irregular  No Foreign Bodies seen or palpated  Anesthesia: local infiltration  Local anesthetic: lidocaine 1% without epinephrine  Anesthetic total: 3 ml  Irrigation method: syringe Amount of cleaning: standard  Skin closure: 4-0 prolene  Number of sutures: 11  Technique: simple interrupted  Patient tolerance: Patient tolerated the procedure well with no immediate complications.  Skin tear to right thumb repaired with cleansing, tissue adhesive.  Superficial laceration to right third digit repaired with tissue adhesive.    Medications Ordered in ED Medications  diphenhydrAMINE (BENADRYL) injection 12.5 mg (not administered)     Initial Impression / Assessment and Plan / ED Course  I have reviewed the triage vital signs and the nursing notes.  Pertinent labs & imaging results that were available during my care of the patient were reviewed by me and considered in my medical decision making (see chart for details).   patient here for evaluation of injuries following a fall, multiple lacerations to face and hands. No evidence of acute fractures. No evidence of systemic symptoms related to her insect bites. Stingers were removed from her left arm, ear. lacerations repaired per procedure note. Counseled pt and family on home care for injuries following a fall with multiple lacerations. Discussed wound care, return precautions. Also  discussed return precautions for insect bites and contusions.  Final Clinical Impressions(s) / ED Diagnoses   Final diagnoses:  Laceration of left cheek without foreign body, initial encounter  Laceration of left hand, foreign body presence unspecified, initial encounter  Fall, initial encounter  Insect stings, assault, initial encounter    New Prescriptions New Prescriptions   No medications on file     Quintella Reichert, MD 03/10/17 [id number]

## 2017-03-08 ENCOUNTER — Emergency Department (HOSPITAL_COMMUNITY): Payer: Medicare Other

## 2017-03-08 DIAGNOSIS — M25522 Pain in left elbow: Secondary | ICD-10-CM | POA: Diagnosis not present

## 2017-03-08 DIAGNOSIS — S01412A Laceration without foreign body of left cheek and temporomandibular area, initial encounter: Secondary | ICD-10-CM | POA: Diagnosis not present

## 2017-03-08 DIAGNOSIS — S59902A Unspecified injury of left elbow, initial encounter: Secondary | ICD-10-CM | POA: Diagnosis not present

## 2017-03-08 MED ORDER — BACITRACIN ZINC 500 UNIT/GM EX OINT
TOPICAL_OINTMENT | CUTANEOUS | Status: AC
  Administered 2017-03-08: 02:00:00
  Filled 2017-03-08: qty 2.7

## 2017-03-08 NOTE — ED Notes (Signed)
Discharge instructions explained to family. Patient ambulatory with a steady gait and no dizziness.

## 2017-03-11 ENCOUNTER — Encounter: Payer: Self-pay | Admitting: Internal Medicine

## 2017-03-11 ENCOUNTER — Ambulatory Visit (INDEPENDENT_AMBULATORY_CARE_PROVIDER_SITE_OTHER)
Admission: RE | Admit: 2017-03-11 | Discharge: 2017-03-11 | Disposition: A | Discharge status: Home / Self Care | Payer: Medicare Other | Source: Ambulatory Visit | Attending: Internal Medicine | Admitting: Internal Medicine

## 2017-03-11 ENCOUNTER — Ambulatory Visit (INDEPENDENT_AMBULATORY_CARE_PROVIDER_SITE_OTHER): Payer: Medicare Other | Admitting: Internal Medicine

## 2017-03-11 VITALS — BP 134/90 | HR 77 | Temp 98.3°F | Resp 16 | Ht 66.0 in | Wt 165.0 lb

## 2017-03-11 DIAGNOSIS — S6991XA Unspecified injury of right wrist, hand and finger(s), initial encounter: Secondary | ICD-10-CM | POA: Diagnosis not present

## 2017-03-11 DIAGNOSIS — M7989 Other specified soft tissue disorders: Secondary | ICD-10-CM | POA: Diagnosis not present

## 2017-03-11 DIAGNOSIS — S6992XS Unspecified injury of left wrist, hand and finger(s), sequela: Secondary | ICD-10-CM | POA: Insufficient documentation

## 2017-03-11 DIAGNOSIS — S6991XS Unspecified injury of right wrist, hand and finger(s), sequela: Secondary | ICD-10-CM

## 2017-03-11 DIAGNOSIS — S6992XA Unspecified injury of left wrist, hand and finger(s), initial encounter: Secondary | ICD-10-CM | POA: Diagnosis not present

## 2017-03-11 DIAGNOSIS — S52611A Displaced fracture of right ulna styloid process, initial encounter for closed fracture: Secondary | ICD-10-CM | POA: Diagnosis not present

## 2017-03-11 NOTE — Patient Instructions (Signed)

## 2017-03-11 NOTE — Progress Notes (Signed)
Subjective:  Patient ID: [name], female    DOB: 22-Feb-1932  Age: 83 y.o. MRN: [id number]  CC: Hand Injury   HPI Thersea Manfredonia presents for recheck after a recent fall - She has wounds and sutures on the left side of her face that are healing nicely. He has wounds and sutures over her left fifth finger that she says are healing nicely. She complains of worsening pain and swelling at the base of both thumbs in the right wrist. She wants to have her hands x-rayed again.  Outpatient Medications Prior to Visit  Medication Sig Dispense Refill  . aspirin 81 MG tablet Take 81 mg by mouth daily.    Marland Kitchen b complex vitamins tablet Take 1 tablet by mouth daily.    Marland Kitchen LORazepam (ATIVAN) 0.5 MG tablet Take 1 tablet (0.5 mg total) by mouth 2 (two) times daily as needed for anxiety. 60 tablet 2  . metoprolol tartrate (LOPRESSOR) 25 MG tablet TAKE ONE TABLET BY MOUTH TWICE DAILY 180 tablet 3  . Omega-3 Fatty Acids (FISH OIL PO) Take 1 capsule by mouth daily.    Marland Kitchen vortioxetine HBr (TRINTELLIX) 5 MG TABS Take 1 tablet (5 mg total) by mouth daily. 30 tablet 5   No facility-administered medications prior to visit.     ROS Review of Systems  Constitutional: Negative for chills and fever.  HENT: Negative.   Eyes: Negative.   Respiratory: Negative.  Negative for cough and shortness of breath.   Cardiovascular: Negative.  Negative for chest pain, palpitations and leg swelling.  Gastrointestinal: Negative for abdominal pain, constipation, diarrhea, nausea and vomiting.  Endocrine: Negative.   Genitourinary: Negative.  Negative for difficulty urinating.  Musculoskeletal: Positive for arthralgias. Negative for back pain, myalgias and neck pain.  Skin: Positive for wound. Negative for color change.  Neurological: Negative.  Negative for dizziness, weakness, numbness and headaches.  Hematological: Negative.  Negative for adenopathy. Does not bruise/bleed easily.  Psychiatric/Behavioral: Negative.      Objective:  BP 134/90 (BP Location: Left Arm, Patient Position: Sitting, Cuff Size: Normal)   Pulse 77   Temp 98.3 F (36.8 C) (Oral)   Resp 16   Ht 5\' 6"  (1.676 m)   Wt 165 lb (74.8 kg)   SpO2 98%   BMI 26.63 kg/m   BP Readings from Last 3 Encounters:  03/11/17 134/90  03/08/17 129/68  03/11/16 138/82    Wt Readings from Last 3 Encounters:  03/11/17 165 lb (74.8 kg)  03/11/16 172 lb (78 kg)  09/09/15 178 lb (80.7 kg)    Physical Exam  Constitutional: No distress.  HENT:  Head:    Mouth/Throat: Oropharynx is clear and moist. No oropharyngeal exudate.  Eyes: Conjunctivae are normal. Right eye exhibits no discharge. Left eye exhibits no discharge. No scleral icterus.  Neck: Normal range of motion. Neck supple. No JVD present. No thyromegaly present.  Cardiovascular: Normal rate, regular rhythm and intact distal pulses.  Exam reveals no gallop.   No murmur heard. Pulmonary/Chest: Effort normal and breath sounds normal. No respiratory distress. She has no wheezes. She has no rales. She exhibits no tenderness.  Abdominal: Soft. Bowel sounds are normal. She exhibits no distension. There is no tenderness. There is no guarding.  Musculoskeletal:       Hands: There is tenderness to palpation swelling at the base of both thumbs. There is quite a bit of degenerative arthritis as well.  Lymphadenopathy:    She has no cervical adenopathy.  Skin:  She is not diaphoretic.  Vitals reviewed.   Lab Results  Component Value Date   WBC 8.6 03/07/2017   HGB 13.7 03/07/2017   HCT 39.4 03/07/2017   PLT 226 03/07/2017   GLUCOSE 100 (H) 03/07/2017   CHOL 166 12/12/2014   TRIG 166.0 (H) 12/12/2014   HDL 53.70 12/12/2014   LDLDIRECT 128.9 04/08/2011   LDLCALC 79 12/12/2014   ALT 14 03/28/2015   AST 14 03/28/2015   NA 137 03/07/2017   K 4.2 03/07/2017   CL 105 03/07/2017   CREATININE 0.97 03/07/2017   BUN 10 03/07/2017   CO2 24 03/07/2017   TSH 0.81 12/12/2014   HGBA1C 5.5  06/09/2013    Dg Chest 2 View  Result Date: 03/07/2017 CLINICAL DATA:  Patient was attacked by yellow check its, fell in struck left hand and left side of head. Insect bites all over body. Former smoker. History of hypertension. EXAM: CHEST  2 VIEW COMPARISON:  CT chest 06/21/2014.  Chest 08/27/2013 FINDINGS: Shallow inspiration with elevation of the left hemidiaphragm. Linear atelectasis in the left lung base. No airspace disease or consolidation in the lungs. Nodular opacity in the right lung apex measures 7 mm, similar to prior study. No blunting of costophrenic angles. No pneumothorax. Mediastinal contours appear intact. Calcified and tortuous aorta. IMPRESSION: 1. Shallow inspiration with elevation of left hemidiaphragm and atelectasis in the left base. 2. Unchanged 7 mm nodule in the right upper lung. 3. Aortic atherosclerosis. Electronically Signed   By: Lucienne Capers M.D.   On: 03/07/2017 23:10   Dg Elbow Complete Left  Result Date: 03/08/2017 CLINICAL DATA:  [place] this evening, landing on her left side. Persistent pain. EXAM: LEFT ELBOW - COMPLETE 3+ VIEW COMPARISON:  None. FINDINGS: There is no evidence of fracture, dislocation, or joint effusion. There is no evidence of arthropathy or other focal bone abnormality. Soft tissues are unremarkable. IMPRESSION: Negative. Electronically Signed   By: Andreas Newport M.D.   On: 03/08/2017 01:23   Ct Head Wo Contrast  Result Date: 03/07/2017 CLINICAL DATA:  Fall, hit left side of head attack bite yellow jackets EXAM: CT HEAD WITHOUT CONTRAST CT MAXILLOFACIAL WITHOUT CONTRAST CT CERVICAL SPINE WITHOUT CONTRAST TECHNIQUE: Multidetector CT imaging of the head, cervical spine, and maxillofacial structures were performed using the standard protocol without intravenous contrast. Multiplanar CT image reconstructions of the cervical spine and maxillofacial structures were also generated. COMPARISON:  None. FINDINGS: CT HEAD FINDINGS Brain: No acute  territorial infarct, hemorrhage or intracranial mass is seen. Scattered subcortical and periventricular white matter hypodensity consistent with small vessel ischemic changes. Mild atrophy. Nonenlarged ventricles. Vascular: No hyperdense vessels. Scattered calcifications at the carotid siphons. Skull: No fracture. Other: Partially visualize left periorbital and forehead hematoma CT MAXILLOFACIAL FINDINGS Osseous: No acute nasal bone fracture is seen. No mandibular fracture. Pterygoid plates and zygomatic arches appear intact Orbits: Negative. No traumatic or inflammatory finding. Sinuses: No acute fluid level. Mucous retention cysts in the maxillary sinuses. No sinus wall fracture Soft tissues: Moderate left periorbital and forehead hematoma. CT CERVICAL SPINE FINDINGS Alignment: No subluxation.  Facet alignment is within normal limits. Skull base and vertebrae: Craniovertebral junction is intact. There is no fracture visualized Soft tissues and spinal canal: No prevertebral fluid or swelling. No visible canal hematoma. Disc levels: Mild degenerative changes at C4-C5, moderate changes at C6-C7. Upper chest: Lung apices clear. No thyroid mass. Partially visualized soft tissue density with scattered calcifications in the right neck soft tissues at approximate C3-C4  level. Carotid artery calcifications. Other: None IMPRESSION: 1. No CT evidence for acute intracranial abnormality. 2. No acute facial bone fracture. Left forehead and periorbital soft tissue hematoma 3. No acute osseous abnormality of the cervical spine 4. Possible right neck soft tissue mass or matted adenopathy. Follow-up contrast-enhanced neck CT recommended when clinically feasible. Electronically Signed   By: Donavan Foil M.D.   On: 03/07/2017 23:17   Ct Cervical Spine Wo Contrast  Result Date: 03/07/2017 CLINICAL DATA:  Fall, hit left side of head attack bite yellow jackets EXAM: CT HEAD WITHOUT CONTRAST CT MAXILLOFACIAL WITHOUT CONTRAST CT  CERVICAL SPINE WITHOUT CONTRAST TECHNIQUE: Multidetector CT imaging of the head, cervical spine, and maxillofacial structures were performed using the standard protocol without intravenous contrast. Multiplanar CT image reconstructions of the cervical spine and maxillofacial structures were also generated. COMPARISON:  None. FINDINGS: CT HEAD FINDINGS Brain: No acute territorial infarct, hemorrhage or intracranial mass is seen. Scattered subcortical and periventricular white matter hypodensity consistent with small vessel ischemic changes. Mild atrophy. Nonenlarged ventricles. Vascular: No hyperdense vessels. Scattered calcifications at the carotid siphons. Skull: No fracture. Other: Partially visualize left periorbital and forehead hematoma CT MAXILLOFACIAL FINDINGS Osseous: No acute nasal bone fracture is seen. No mandibular fracture. Pterygoid plates and zygomatic arches appear intact Orbits: Negative. No traumatic or inflammatory finding. Sinuses: No acute fluid level. Mucous retention cysts in the maxillary sinuses. No sinus wall fracture Soft tissues: Moderate left periorbital and forehead hematoma. CT CERVICAL SPINE FINDINGS Alignment: No subluxation.  Facet alignment is within normal limits. Skull base and vertebrae: Craniovertebral junction is intact. There is no fracture visualized Soft tissues and spinal canal: No prevertebral fluid or swelling. No visible canal hematoma. Disc levels: Mild degenerative changes at C4-C5, moderate changes at C6-C7. Upper chest: Lung apices clear. No thyroid mass. Partially visualized soft tissue density with scattered calcifications in the right neck soft tissues at approximate C3-C4 level. Carotid artery calcifications. Other: None IMPRESSION: 1. No CT evidence for acute intracranial abnormality. 2. No acute facial bone fracture. Left forehead and periorbital soft tissue hematoma 3. No acute osseous abnormality of the cervical spine 4. Possible right neck soft tissue mass  or matted adenopathy. Follow-up contrast-enhanced neck CT recommended when clinically feasible. Electronically Signed   By: Donavan Foil M.D.   On: 03/07/2017 23:17   Dg Hand Complete Left  Result Date: 03/07/2017 CLINICAL DATA:  Patient was attacked by yellow jackets, fell, and hit the left hand. EXAM: LEFT HAND - COMPLETE 3+ VIEW COMPARISON:  None. FINDINGS: Diffuse bone demineralization. Degenerative changes in the interphalangeal joints. Degenerative changes in the radiocarpal, STT, and first carpometacarpal joints. Calcification in the triangular fibrocartilage. Laceration along the medial aspect of the left hand. No radiopaque soft tissue foreign bodies. No acute fracture or dislocation identified. IMPRESSION: Degenerative changes and demineralization of the left hand. Soft tissue laceration along the medial aspect of the left hand. No radiopaque foreign bodies. No acute fractures. Electronically Signed   By: Lucienne Capers M.D.   On: 03/07/2017 23:11   Dg Hand Complete Right  Result Date: 03/07/2017 CLINICAL DATA:  Lacerations to the right hand after a fall. EXAM: RIGHT HAND - COMPLETE 3+ VIEW COMPARISON:  None. FINDINGS: Diffuse bone demineralization. Degenerative changes in the interphalangeal joints, first metacarpal phalangeal joint, first carpometacarpal joint, STT, and radiocarpal joints. Old ununited ossicle over the ulnar styloid process. Calcification in the triangular fibrocartilage. Fixed flexion/extension deformities of the right fifth finger likely indicating ligamentous injury. Correlation with history  is recommended to determine whether this is an acute or a chronic problem. Otherwise, no evidence of acute fracture or dislocation. No radiopaque soft tissue foreign bodies. IMPRESSION: Diffuse bone demineralization and degenerative changes in the right hand and wrist. Flexion and extension deformities in the right fifth finger likely representing ligamentous injury, acute versus  chronic. Otherwise, no acute fracture or dislocation identified. Electronically Signed   By: Lucienne Capers M.D.   On: 03/07/2017 23:13   Ct Maxillofacial Wo Cm  Result Date: 03/07/2017 CLINICAL DATA:  Fall, hit left side of head attack bite yellow jackets EXAM: CT HEAD WITHOUT CONTRAST CT MAXILLOFACIAL WITHOUT CONTRAST CT CERVICAL SPINE WITHOUT CONTRAST TECHNIQUE: Multidetector CT imaging of the head, cervical spine, and maxillofacial structures were performed using the standard protocol without intravenous contrast. Multiplanar CT image reconstructions of the cervical spine and maxillofacial structures were also generated. COMPARISON:  None. FINDINGS: CT HEAD FINDINGS Brain: No acute territorial infarct, hemorrhage or intracranial mass is seen. Scattered subcortical and periventricular white matter hypodensity consistent with small vessel ischemic changes. Mild atrophy. Nonenlarged ventricles. Vascular: No hyperdense vessels. Scattered calcifications at the carotid siphons. Skull: No fracture. Other: Partially visualize left periorbital and forehead hematoma CT MAXILLOFACIAL FINDINGS Osseous: No acute nasal bone fracture is seen. No mandibular fracture. Pterygoid plates and zygomatic arches appear intact Orbits: Negative. No traumatic or inflammatory finding. Sinuses: No acute fluid level. Mucous retention cysts in the maxillary sinuses. No sinus wall fracture Soft tissues: Moderate left periorbital and forehead hematoma. CT CERVICAL SPINE FINDINGS Alignment: No subluxation.  Facet alignment is within normal limits. Skull base and vertebrae: Craniovertebral junction is intact. There is no fracture visualized Soft tissues and spinal canal: No prevertebral fluid or swelling. No visible canal hematoma. Disc levels: Mild degenerative changes at C4-C5, moderate changes at C6-C7. Upper chest: Lung apices clear. No thyroid mass. Partially visualized soft tissue density with scattered calcifications in the right  neck soft tissues at approximate C3-C4 level. Carotid artery calcifications. Other: None IMPRESSION: 1. No CT evidence for acute intracranial abnormality. 2. No acute facial bone fracture. Left forehead and periorbital soft tissue hematoma 3. No acute osseous abnormality of the cervical spine 4. Possible right neck soft tissue mass or matted adenopathy. Follow-up contrast-enhanced neck CT recommended when clinically feasible. Electronically Signed   By: Donavan Foil M.D.   On: 03/07/2017 23:17    Assessment & Plan:   Kamoria was seen today for hand injury.  Diagnoses and all orders for this visit:  Hand injury, left, sequela- I think most of her pain is related to [facility] arthritis. The wounds are healing nicely with no evidence of complications or infection. I think the sutures should remain in place for another 7 days. -     DG Hand Complete Left; Future  Hand injury, right, sequela- her pain and swelling is related to [facility] arthritis that has been exacerbated by the injury. I've asked her to rest ice and elevate the right hand is much as possible. -     DG Hand Complete Right; Future  Closed displaced fracture of styloid process of right ulna, initial encounter- I don't know that this is significant enough to require casting but I've asked her to see orthopedics for recommendations. -     Ambulatory referral to Orthopedic Surgery   I am having Ms. Marty maintain her aspirin, b complex vitamins, Omega-3 Fatty Acids (FISH OIL PO), LORazepam, vortioxetine HBr, and metoprolol tartrate.  No orders of the defined types were placed  in this encounter.    Follow-up: Return in about 1 week (around 03/18/2017).  Scarlette Calico, MD

## 2017-03-17 ENCOUNTER — Ambulatory Visit (INDEPENDENT_AMBULATORY_CARE_PROVIDER_SITE_OTHER): Payer: Medicare Other | Admitting: Orthopaedic Surgery

## 2017-03-17 ENCOUNTER — Encounter (INDEPENDENT_AMBULATORY_CARE_PROVIDER_SITE_OTHER): Payer: Self-pay | Admitting: Orthopaedic Surgery

## 2017-03-17 DIAGNOSIS — S52611A Displaced fracture of right ulna styloid process, initial encounter for closed fracture: Secondary | ICD-10-CM | POA: Diagnosis not present

## 2017-03-17 NOTE — Progress Notes (Signed)
Office Visit Note   Patient: Jocelyn [name]           Date of Birth: 1932-07-03           MRN: [id number] Visit Date: 03/17/2017              Requested by: Janith Lima, MD [address] [place], [place] [address] PCP: Janith Lima, MD   Assessment & Plan: Visit Diagnoses:  1. Closed displaced fracture of styloid process of right ulna, initial encounter     Plan: Patient has minimally displaced ulnar styloid fracture. Symptomatically and clinically she does not really bothered by it. Don't think this needs to be supported were mobilized. Activity as tolerated. Follow-up as needed.  Follow-Up Instructions: Return if symptoms worsen or fail to improve.   Orders:  No orders of the defined types were placed in this encounter.  No orders of the defined types were placed in this encounter.     Procedures: No procedures performed   Clinical Data: No additional findings.   Subjective: Chief Complaint  Patient presents with  . Right Hand - Pain    Patient is a 83 year old female who sustained a minimally displaced right ulnar styloid fracture on 03/11/2017 from a fall. She complains of mild pain. Denies any numbness and tingling. Pain is worse with use of the hand and wrist.    Review of Systems  Constitutional: Negative.   HENT: Negative.   Eyes: Negative.   Respiratory: Negative.   Cardiovascular: Negative.   Endocrine: Negative.   Musculoskeletal: Negative.   Neurological: Negative.   Hematological: Negative.   Psychiatric/Behavioral: Negative.   All other systems reviewed and are negative.    Objective: Vital Signs: There were no vitals taken for this visit.  Physical Exam  Constitutional: She is oriented to person, place, and time. She appears well-developed and well-nourished.  HENT:  Head: Normocephalic and atraumatic.  Eyes: EOM are normal.  Neck: Neck supple.  Pulmonary/Chest: Effort normal.  Abdominal: Soft.  Neurological: She  is alert and oriented to person, place, and time.  Skin: Skin is warm. Capillary refill takes less than 2 seconds.  Psychiatric: She has a normal mood and affect. Her behavior is normal. Judgment and thought content normal.  Nursing note and vitals reviewed.   Ortho Exam Right hand and wrist exam shows mild tender over the ulnar styloid. Otherwise exam is unremarkable. She does have Dupuytren's contracture of her small finger. Specialty Comments:  No specialty comments available.  Imaging: No results found.   PMFS History: Patient Active Problem List   Diagnosis Date Noted  . Hand injury, left, sequela 03/11/2017  . Hand injury, right, sequela 03/11/2017  . Closed displaced fracture of styloid process of right ulna 03/11/2017  . Thoracic aorta atherosclerosis ([place]) 12/12/2014  . Lung nodules 08/29/2013  . Thrombocytopenia ([place]) 06/30/2013  . Depression with anxiety 06/09/2013  . Other abnormal glucose 06/09/2013  . Routine general medical examination at a health care facility 06/09/2013  . Dyslipidemia, goal LDL below 130 09/20/2010  . Essential hypertension 12/22/2008   Past Medical History:  Diagnosis Date  . HTN (hypertension)   . Hyperlipidemia   . Urinary incontinence     Family History  Problem Relation Age of Onset  . Diabetes Unknown        1st degree relative  . Hyperlipidemia Unknown   . Stroke Unknown        1st degree Female relative <50  .  Cancer Neg Hx   . Early death Neg Hx   . Heart disease Neg Hx   . Hypertension Neg Hx   . Kidney disease Neg Hx     Past Surgical History:  Procedure Laterality Date  . ABDOMINAL HYSTERECTOMY    . TONSILLECTOMY     Social History   Occupational History  . Retired    Social History Main Topics  . Smoking status: Former Research scientist (life sciences)  . Smokeless tobacco: Former Systems developer    Quit date: 03/17/2017     Comment: STATES SHE QUIT 25 YEARS AGO  . Alcohol use 8.4 oz/week    7 Glasses of wine, 7 Cans of beer per week  . Drug  use: No  . Sexual activity: Yes    Birth control/ protection: Surgical

## 2017-03-18 ENCOUNTER — Ambulatory Visit (INDEPENDENT_AMBULATORY_CARE_PROVIDER_SITE_OTHER): Payer: Medicare Other | Admitting: Internal Medicine

## 2017-03-18 ENCOUNTER — Encounter: Payer: Self-pay | Admitting: Internal Medicine

## 2017-03-18 VITALS — BP 134/78 | HR 55 | Temp 98.6°F | Resp 16 | Ht 66.0 in | Wt 167.5 lb

## 2017-03-18 DIAGNOSIS — Z5189 Encounter for other specified aftercare: Secondary | ICD-10-CM

## 2017-03-18 DIAGNOSIS — Z23 Encounter for immunization: Secondary | ICD-10-CM

## 2017-03-18 NOTE — Patient Instructions (Signed)

## 2017-03-19 DIAGNOSIS — Z5189 Encounter for other specified aftercare: Secondary | ICD-10-CM | POA: Insufficient documentation

## 2017-03-19 NOTE — Progress Notes (Signed)
Subjective:  Patient ID: Jocelyn [name], female    DOB: 1932-01-09  Age: 83 y.o. MRN: [id number]  CC: Wound Check   HPI Jocelyn [name] presents for a wound check and suture removal. The sights are healing nicely, she has no complaints about the wounds.  Outpatient Medications Prior to Visit  Medication Sig Dispense Refill  . aspirin 81 MG tablet Take 81 mg by mouth daily.    Marland Kitchen b complex vitamins tablet Take 1 tablet by mouth daily.    Marland Kitchen LORazepam (ATIVAN) 0.5 MG tablet Take 1 tablet (0.5 mg total) by mouth 2 (two) times daily as needed for anxiety. 60 tablet 2  . metoprolol tartrate (LOPRESSOR) 25 MG tablet TAKE ONE TABLET BY MOUTH TWICE DAILY 180 tablet 3  . Omega-3 Fatty Acids (FISH OIL PO) Take 1 capsule by mouth daily.    Marland Kitchen vortioxetine HBr (TRINTELLIX) 5 MG TABS Take 1 tablet (5 mg total) by mouth daily. 30 tablet 5   No facility-administered medications prior to visit.     ROS Review of Systems  All other systems reviewed and are negative.   Objective:  BP 134/78 (BP Location: Left Arm, Patient Position: Sitting, Cuff Size: Normal)   Pulse (!) 55   Temp 98.6 F (37 C) (Oral)   Resp 16   Ht 5\' 6"  (1.676 m)   Wt 167 lb 8 oz (76 kg)   SpO2 99%   BMI 27.04 kg/m   BP Readings from Last 3 Encounters:  03/18/17 134/78  03/11/17 134/90  03/08/17 129/68    Wt Readings from Last 3 Encounters:  03/18/17 167 lb 8 oz (76 kg)  03/11/17 165 lb (74.8 kg)  03/11/16 172 lb (78 kg)    Physical Exam  HENT:  Head:    Musculoskeletal:       Hands:   Lab Results  Component Value Date   WBC 8.6 03/07/2017   HGB 13.7 03/07/2017   HCT 39.4 03/07/2017   PLT 226 03/07/2017   GLUCOSE 100 (H) 03/07/2017   CHOL 166 12/12/2014   TRIG 166.0 (H) 12/12/2014   HDL 53.70 12/12/2014   LDLDIRECT 128.9 04/08/2011   LDLCALC 79 12/12/2014   ALT 14 03/28/2015   AST 14 03/28/2015   NA 137 03/07/2017   K 4.2 03/07/2017   CL 105 03/07/2017   CREATININE 0.97 03/07/2017   BUN 10  03/07/2017   CO2 24 03/07/2017   TSH 0.81 12/12/2014   HGBA1C 5.5 06/09/2013    Dg Hand Complete Left  Result Date: 03/11/2017 CLINICAL DATA:  Fall with swelling and pain of left hand. Initial encounter. EXAM: LEFT HAND - COMPLETE 3+ VIEW COMPARISON:  None. FINDINGS: No acute fracture is identified. Subluxation of the first [facility] joint is likely degenerative an on the basis of visible advanced osteoarthritis. No bony lesions, destruction or erosions. No soft tissue foreign body. IMPRESSION: No acute fracture. Marked degenerative disease of the first [facility] joint with associated subluxation. Electronically Signed   By: Aletta Edouard M.D.   On: 03/11/2017 16:17   Dg Hand Complete Right  Result Date: 03/11/2017 CLINICAL DATA:  Status post fall 5 days ago. Bilateral hand pain and swelling. Chronic tendinous abnormality of the right fifth finger. EXAM: RIGHT HAND - COMPLETE 3+ VIEW COMPARISON:  Right hand series of March 07, 2017 FINDINGS: The bones are osteopenic. There is chronic deformity of the fifth finger at the MCP joints and the PIP joint with the digit in partial flexion. The other  phalanges appear intact. There is mild degenerative narrowing of the second and third MCP joints. There is severe osteoarthritic change of the first [facility] joint with milder degenerative changes of the articulation of the scaphoid with the trapezium and trapezoid. No acute carpal bone fracture or distal radius radial fracture is observed. There is an ulnar styloid fracture which may be acute. IMPRESSION: No acute fracture of the bones of the hand is observed. There degenerative and likely post traumatic changes involving the second and third MCP joints and the fifth digit respectively. There is severe degenerative change of the first [facility] joint. There is an ulnar styloid fracture which may be acute. Electronically Signed   By: David  Martinique M.D.   On: 03/11/2017 16:17    Assessment & Plan:   Alizeh was seen today for wound  check.  Diagnoses and all orders for this visit:  Encounter for post-traumatic wound check- sutures removed, wounds are healing nicely with no complications. She will let me know if there are any changes.  Other orders -     Pneumococcal polysaccharide vaccine 23-valent greater than or equal to 2yo subcutaneous/IM   I am having [name] maintain her aspirin, b complex vitamins, Omega-3 Fatty Acids (FISH OIL PO), LORazepam, vortioxetine HBr, and metoprolol tartrate.  No orders of the defined types were placed in this encounter.    Follow-up: Return if symptoms worsen or fail to improve.  Scarlette Calico, MD

## 2017-05-28 DIAGNOSIS — Z23 Encounter for immunization: Secondary | ICD-10-CM | POA: Diagnosis not present

## 2017-07-04 ENCOUNTER — Encounter (HOSPITAL_COMMUNITY): Payer: Self-pay | Admitting: Emergency Medicine

## 2017-07-04 ENCOUNTER — Ambulatory Visit (HOSPITAL_COMMUNITY)
Admission: EM | Admit: 2017-07-04 | Discharge: 2017-07-04 | Disposition: A | Discharge status: Home / Self Care | Payer: Medicare Other | Attending: Emergency Medicine | Admitting: Emergency Medicine

## 2017-07-04 ENCOUNTER — Other Ambulatory Visit: Payer: Self-pay

## 2017-07-04 DIAGNOSIS — S60419A Abrasion of unspecified finger, initial encounter: Secondary | ICD-10-CM | POA: Diagnosis not present

## 2017-07-04 DIAGNOSIS — S0083XA Contusion of other part of head, initial encounter: Secondary | ICD-10-CM

## 2017-07-04 DIAGNOSIS — S0081XA Abrasion of other part of head, initial encounter: Secondary | ICD-10-CM

## 2017-07-04 DIAGNOSIS — W19XXXA Unspecified fall, initial encounter: Secondary | ICD-10-CM

## 2017-07-04 DIAGNOSIS — S01511A Laceration without foreign body of lip, initial encounter: Secondary | ICD-10-CM | POA: Diagnosis not present

## 2017-07-04 NOTE — Discharge Instructions (Signed)
Keep pressure on the lip laceration. For the inside lip laceration swishing clean and rinse with warm salt water frequently and after eating and drinking. The stitches can come out in about 5 days. For any sign of infection return promptly. Place ice on the side of the face and around the eye to help with swelling and bruising. Read instructions regarding care of wounds and head injuries. For any problems, change in behavior or activity, weakness on one side of the body, unusual sleepiness, hard to wake or other problems go directly to the emergency department or call 911.

## 2017-07-04 NOTE — ED Provider Notes (Signed)
[place]    CSN: [id number] Arrival date & time: 07/04/17  1237     History   Chief Complaint Chief Complaint  Patient presents with  . Fall  . Facial Pain    HPI Jocelyn [name] is a 83 y.o. female.   83 year old female that tripped and fell earlier this morning. She fell on right side of her body striking the right side of her face on the ground. She suffered some swelling and ecchymosis to the right face and cheek and periorbital area. She also received a Y-shaped laceration just above the upper lip. There is also a superficial laceration to mucosal aspect of the upper lip. Denies injury to the head, neck, back, chest, abdomen or extremities. She is fully awake and alert and oriented. Her significant other states that she has had no change in behavior. No confusion, disorientation or memory problems. Her speech is clear and is able to recall all of the events of this morning.      Past Medical History:  Diagnosis Date  . HTN (hypertension)   . Hyperlipidemia   . Urinary incontinence     Patient Active Problem List   Diagnosis Date Noted  . Encounter for post-traumatic wound check 03/19/2017  . Closed displaced fracture of styloid process of right ulna 03/11/2017  . Thoracic aorta atherosclerosis ([place]) 12/12/2014  . Lung nodules 08/29/2013  . Thrombocytopenia ([place]) 06/30/2013  . Depression with anxiety 06/09/2013  . Other abnormal glucose 06/09/2013  . Routine general medical examination at a health care facility 06/09/2013  . Dyslipidemia, goal LDL below 130 09/20/2010  . Essential hypertension 12/22/2008    Past Surgical History:  Procedure Laterality Date  . ABDOMINAL HYSTERECTOMY    . TONSILLECTOMY      OB History    No data available       Home Medications    Prior to Admission medications   Medication Sig Start Date End Date Taking? Authorizing Provider  aspirin 81 MG tablet Take 81 mg by mouth daily.   Yes [provider]  b  complex vitamins tablet Take 1 tablet by mouth daily.   Yes [provider]  LORazepam (ATIVAN) 0.5 MG tablet Take 1 tablet (0.5 mg total) by mouth 2 (two) times daily as needed for anxiety. 03/11/16  Yes Janith Lima, MD  metoprolol tartrate (LOPRESSOR) 25 MG tablet TAKE ONE TABLET BY MOUTH TWICE DAILY 06/11/16  Yes Janith Lima, MD  Omega-3 Fatty Acids (FISH OIL PO) Take 1 capsule by mouth daily.   Yes [provider]  vortioxetine HBr (TRINTELLIX) 5 MG TABS Take 1 tablet (5 mg total) by mouth daily. 03/11/16  Yes Janith Lima, MD    Family History Family History  Problem Relation Age of Onset  . Diabetes Unknown        1st degree relative  . Hyperlipidemia Unknown   . Stroke Unknown        1st degree Female relative <50  . Cancer Neg Hx   . Early death Neg Hx   . Heart disease Neg Hx   . Hypertension Neg Hx   . Kidney disease Neg Hx     Social History Social History   Tobacco Use  . Smoking status: Former Research scientist (life sciences)  . Smokeless tobacco: Former Systems developer    Quit date: 03/17/2017  . Tobacco comment: STATES SHE QUIT 25 YEARS AGO  Substance Use Topics  . Alcohol use: Yes    Alcohol/week: 8.4 oz  Types: 7 Glasses of wine, 7 Cans of beer per week  . Drug use: No     Allergies   Codeine   Review of Systems Review of Systems  Constitutional: Positive for activity change. Negative for fever.  HENT:       Injury per history of present illness otherwise negative  Eyes: Negative.   Respiratory: Negative.   Cardiovascular: Negative for chest pain and leg swelling.  Gastrointestinal: Negative.   Musculoskeletal: Negative.   Skin: Positive for wound.  Neurological: Negative.  Negative for dizziness, tremors, facial asymmetry, weakness, numbness and headaches.  All other systems reviewed and are negative.    Physical Exam Triage Vital Signs ED Triage Vitals  Enc Vitals Group     BP 07/04/17 1323 (!) 144/81     Pulse Rate 07/04/17 1323 60     Resp  07/04/17 1323 16     Temp 07/04/17 1323 (!) 97.4 F (36.3 C)     Temp src --      SpO2 07/04/17 1323 100 %     Weight --      Height --      Head Circumference --      Peak Flow --      Pain Score 07/04/17 1325 8     Pain Loc --      Pain Edu? --      Excl. in [place]? --    No data found.  Updated Vital Signs BP (!) 144/81   Pulse 60   Temp (!) 97.4 F (36.3 C)   Resp 16   SpO2 100%   Visual Acuity Right Eye Distance:   Left Eye Distance:   Bilateral Distance:    Right Eye Near:   Left Eye Near:    Bilateral Near:     Physical Exam  Constitutional: She is oriented to person, place, and time. She appears well-developed and well-nourished. No distress.  HENT:  Head: Normocephalic.  Right Ear: External ear normal.  Left Ear: External ear normal.  Nose: Nose normal.  Mouth/Throat: Oropharynx is clear and moist.  Right periorbital ecchymosis and mild swelling. Tenderness to the area but no appreciable palpable skull fractures or fractures around the orbit. The eye is completely intact. Soreness over the right zygoma as well as a small abrasion. Demonstrates full range of motion of the mandible.  Eyes: EOM are normal. Pupils are equal, round, and reactive to light.  Neck: Normal range of motion. Neck supple.  Cardiovascular: Normal rate, regular rhythm and normal heart sounds.  Pulmonary/Chest: Effort normal and breath sounds normal.  Musculoskeletal: Normal range of motion.  Lymphadenopathy:    She has no cervical adenopathy.  Neurological: She is alert and oriented to person, place, and time. No cranial nerve deficit. Coordination normal.  Skin: Skin is warm and dry.  Small Y-shaped laceration over the upper lip involving approximate 2 mm of the vermilion border. 4 mm superficial laceration to the mucosal aspect of the upper lip as well approximated and remains closed without intervention. Couple small abrasions to the right side of the face.  Psychiatric: She has a normal  mood and affect. Her behavior is normal. Judgment and thought content normal.  Nursing note and vitals reviewed.    UC Treatments / Results  Labs (all labs ordered are listed, but only abnormal results are displayed) Labs Reviewed - No data to display  EKG  EKG Interpretation None       Radiology No results found.  Procedures Laceration Repair Date/Time: 07/04/2017 9:10 PM Performed by: Janne Napoleon, NP Authorized by: Janne Napoleon, NP   Consent:    Consent obtained:  Verbal   Consent given by:  Patient   Risks discussed:  Infection and pain Anesthesia (see MAR for exact dosages):    Anesthesia method:  Local infiltration   Local anesthetic:  Lidocaine 2% WITH epi Laceration details:    Location:  Lip   Lip location:  Upper exterior lip   Length (cm):  1.2   Depth (mm):  3 Repair type:    Repair type:  Simple Pre-procedure details:    Preparation:  Patient was prepped and draped in usual sterile fashion Exploration:    Hemostasis achieved with:  Epinephrine and direct pressure   Wound exploration: entire depth of wound probed and visualized     Wound extent: no muscle damage noted     Contaminated: no   Treatment:    Area cleansed with:  Saline   Amount of cleaning:  Standard   Irrigation solution:  Sterile saline   Irrigation method:  Syringe Approximation:    Approximation:  Close   Vermilion border: well-aligned   Post-procedure details:    Dressing:  Open (no dressing)   Patient tolerance of procedure:  Tolerated well, no immediate complications   (including critical care time)  Medications Ordered in UC Medications - No data to display   Initial Impression / Assessment and Plan / UC Course  I have reviewed the triage vital signs and the nursing notes.  Pertinent labs & imaging results that were available during my care of the patient were reviewed by me and considered in my medical decision making (see chart for details).    Keep pressure on  the lip laceration. For the inside lip laceration swishing clean and rinse with warm salt water frequently and after eating and drinking. The stitches can come out in about 5 days. For any sign of infection return promptly. Place ice on the side of the face and around the eye to help with swelling and bruising. Read instructions regarding care of wounds and head injuries. For any problems, change in behavior or activity, weakness on one side of the body, unusual sleepiness, hard to wake or other problems go directly to the emergency department or call 911.    Final Clinical Impressions(s) / UC Diagnoses   Final diagnoses:  Fall, initial encounter  Contusion of face, initial encounter  Lip laceration, initial encounter  Abrasion of periorbital region of face, initial encounter  Abrasion of finger of right hand, initial encounter    ED Discharge Orders    None       Controlled Substance Prescriptions [place] Controlled Substance Registry consulted? Not Applicable   Janne Napoleon, NP 07/04/17 2112

## 2017-07-04 NOTE — ED Triage Notes (Signed)
Pt states she tripped over her feet, fell forward on her face and hit the R side of her head. Pt has lac to R upper lip, bruising around R eye. Pt denies LOC, is not on blood thinners. Pt also c/o L ankle pain, ambulatory with steady gait.

## 2017-12-05 NOTE — Progress Notes (Addendum)
Subjective:   Jocelyn [name] is a 84 y.o. female who presents for Medicare Annual (Subsequent) preventive examination.  Review of Systems:  No ROS.  Medicare Wellness Visit. Additional risk factors are reflected in the social history.  Cardiac Risk Factors include: advanced age (>75men, >59 women);dyslipidemia;hypertension Sleep patterns: gets up 1-2 times nightly to void and sleeps 7 hours nightly.    Home Safety/Smoke Alarms: Feels safe in home. Smoke alarms in place.  Living environment; residence and Firearm Safety: 1-story house/ trailer, no firearms. Lives alone*, no needs for DME, good support system Seat Belt Safety/Bike Helmet: Wears seat belt.     Objective:     Vitals: BP 132/84   Pulse 66   Resp 18   Ht 5\' 6"  (1.676 m)   Wt 159 lb (72.1 kg)   SpO2 98%   BMI 25.66 kg/m   Body mass index is 25.66 kg/m.  Advanced Directives 12/08/2017 03/11/2016 01/04/2015  Does Patient Have a Medical Advance Directive? No Yes No  Copy of Healthcare Power of Attorney in Chart? - Yes -  Would patient like information on creating a medical advance directive? Yes (ED - Information included in AVS) - Yes - Educational materials given    Tobacco Social History   Tobacco Use  Smoking Status Former Smoker  Smokeless Tobacco Former Systems developer  . Quit date: 03/17/2017  Tobacco Comment   STATES SHE QUIT 25 YEARS AGO     Counseling given: Not Answered Comment: STATES SHE QUIT 25 YEARS AGO  Past Medical History:  Diagnosis Date  . HTN (hypertension)   . Hyperlipidemia   . Urinary incontinence    Past Surgical History:  Procedure Laterality Date  . ABDOMINAL HYSTERECTOMY    . TONSILLECTOMY     Family History  Problem Relation Age of Onset  . Diabetes Unknown        1st degree relative  . Hyperlipidemia Unknown   . Stroke Unknown        1st degree Female relative <50  . Cancer Neg Hx   . Early death Neg Hx   . Heart disease Neg Hx   . Hypertension Neg Hx   . Kidney disease Neg Hx      Social History   Socioeconomic History  . Marital status: Widowed    Spouse name: Not on file  . Number of children: 1  . Years of education: Not on file  . Highest education level: Not on file  Occupational History  . Occupation: Retired  Scientific laboratory technician  . Financial resource strain: Not hard at all  . Food insecurity:    Worry: Never true    Inability: Never true  . Transportation needs:    Medical: No    Non-medical: No  Tobacco Use  . Smoking status: Former Research scientist (life sciences)  . Smokeless tobacco: Former Systems developer    Quit date: 03/17/2017  . Tobacco comment: STATES SHE QUIT 25 YEARS AGO  Substance and Sexual Activity  . Alcohol use: Yes    Alcohol/week: 8.4 oz    Types: 7 Glasses of Bevely Hackbart, 7 Cans of beer per week  . Drug use: No  . Sexual activity: Never    Birth control/protection: Surgical  Lifestyle  . Physical activity:    Days per week: 3 days    Minutes per session: 70 min  . Stress: Not at all  Relationships  . Social connections:    Talks on phone: More than three times a week    Gets  together: More than three times a week    Attends religious service: Never    Active member of club or organization: Not on file    Attends meetings of clubs or organizations: Never    Relationship status: Widowed  Other Topics Concern  . Not on file  Social History Narrative   Regular Exercise-no          Outpatient Encounter Medications as of 12/08/2017  Medication Sig  . aspirin 81 MG tablet Take 81 mg by mouth daily.  Marland Kitchen b complex vitamins tablet Take 1 tablet by mouth daily.  Marland Kitchen LORazepam (ATIVAN) 0.5 MG tablet Take 1 tablet (0.5 mg total) by mouth 2 (two) times daily as needed for anxiety.  . metoprolol tartrate (LOPRESSOR) 25 MG tablet Take 1 tablet (25 mg total) by mouth 2 (two) times daily.  . Omega-3 Fatty Acids (FISH OIL PO) Take 1 capsule by mouth daily.  . [DISCONTINUED] metoprolol tartrate (LOPRESSOR) 25 MG tablet TAKE ONE TABLET BY MOUTH TWICE DAILY  . [DISCONTINUED]  vortioxetine HBr (TRINTELLIX) 5 MG TABS Take 1 tablet (5 mg total) by mouth daily. (Patient not taking: Reported on 12/08/2017)   No facility-administered encounter medications on file as of 12/08/2017.     Activities of Daily Living In your present state of health, do you have any difficulty performing the following activities: 12/08/2017  Hearing? N  Vision? N  Difficulty concentrating or making decisions? N  Walking or climbing stairs? N  Dressing or bathing? N  Doing errands, shopping? N  Preparing Food and eating ? N  Using the Toilet? N  In the past six months, have you accidently leaked urine? N  Do you have problems with loss of bowel control? N  Managing your Medications? N  Managing your Finances? N  Housekeeping or managing your Housekeeping? N  Some recent data might be hidden    Patient Care Team: Janith Lima, MD as PCP - General    Assessment:   This is a routine wellness examination for [facility]. Physical assessment deferred to PCP.   Exercise Activities and Dietary recommendations Current Exercise Habits: The patient does not participate in regular exercise at present  Diet (meal preparation, eat out, water intake, caffeinated beverages, dairy products, fruits and vegetables): in general, a "healthy" diet   reports poor appetite at times and supplements with boost (boost coupons provided)  Reviewed heart healthy diet, encouraged patient to increase daily water intake.   Goals    . Patient Stated     Maintain my current health status, stay as healthy and as independent as possible.       Fall Risk Fall Risk  12/08/2017 03/18/2017 03/11/2017 03/11/2016 06/09/2013  Falls in the past year? Yes Yes Yes No No  Number falls in past yr: 2 or more 1 1 - -  Injury with Fall? Yes Yes Yes - -  Risk Factor Category  High Fall Risk - - - -  Risk for fall due to : Impaired balance/gait Other (Comment) - - -  Risk for fall due to: Comment - Patient was chased by yellow  jackets - - -  Follow up Falls prevention discussed;Education provided Falls prevention discussed - - -   Depression Screen PHQ 2/9 Scores 12/08/2017 03/18/2017 03/11/2016 06/09/2013  PHQ - 2 Score 1 1 2  0  PHQ- 9 Score 3 - 4 -     Cognitive Function MMSE - Mini Mental State Exam 12/08/2017 03/11/2016  Not completed: - (No  Data)  Orientation to time 5 -  Orientation to Place 5 -  Registration 3 -  Attention/ Calculation 5 -  Recall 3 -  Language- name 2 objects 2 -  Language- repeat 1 -  Language- follow 3 step command 3 -  Language- read & follow direction 1 -  Write a sentence 1 -  Copy design 1 -  Total score 30 -        Immunization History  Administered Date(s) Administered  . Influenza Whole 05/22/2010, 06/12/2012  . Influenza-Unspecified 05/13/2013, 06/05/2016  . Pneumococcal Conjugate-13 06/09/2013  . Pneumococcal Polysaccharide-23 03/18/2017  . Td 09/20/2010  . Zoster 09/20/2010   Screening Tests Health Maintenance  Topic Date Due  . INFLUENZA VACCINE  03/12/2018  . TETANUS/TDAP  09/20/2020  . DEXA SCAN  Completed  . PNA vac Low Risk Adult  Completed      Plan:    Continue doing brain stimulating activities (puzzles, reading, adult coloring books, staying active) to keep memory sharp.   Continue to eat heart healthy diet (full of fruits, vegetables, whole grains, lean protein, water--limit salt, fat, and sugar intake) and increase physical activity as tolerated.  I have personally reviewed and noted the following in the patient's chart:   . Medical and social history . Use of alcohol, tobacco or illicit drugs  . Current medications and supplements . Functional ability and status . Nutritional status . Physical activity . Advanced directives . List of other physicians . Vitals . Screenings to include cognitive, depression, and falls . Referrals and appointments  In addition, I have reviewed and discussed with patient certain preventive protocols,  quality metrics, and best practice recommendations. A written personalized care plan for preventive services as well as general preventive health recommendations were provided to patient.     Michiel Cowboy, RN  12/08/2017  Medical screening examination/treatment/procedure(s) were performed by non-physician practitioner and as supervising physician I was immediately available for consultation/collaboration. I agree with above. Scarlette Calico, MD

## 2017-12-08 ENCOUNTER — Other Ambulatory Visit: Payer: Self-pay | Admitting: Internal Medicine

## 2017-12-08 ENCOUNTER — Ambulatory Visit (INDEPENDENT_AMBULATORY_CARE_PROVIDER_SITE_OTHER): Payer: Medicare Other | Admitting: *Deleted

## 2017-12-08 ENCOUNTER — Telehealth: Payer: Self-pay | Admitting: *Deleted

## 2017-12-08 VITALS — BP 132/84 | HR 66 | Resp 18 | Ht 66.0 in | Wt 159.0 lb

## 2017-12-08 DIAGNOSIS — M8949 Other hypertrophic osteoarthropathy, multiple sites: Secondary | ICD-10-CM

## 2017-12-08 DIAGNOSIS — F418 Other specified anxiety disorders: Secondary | ICD-10-CM

## 2017-12-08 DIAGNOSIS — Z0000 Encounter for general adult medical examination without abnormal findings: Secondary | ICD-10-CM

## 2017-12-08 DIAGNOSIS — M159 Polyosteoarthritis, unspecified: Secondary | ICD-10-CM | POA: Insufficient documentation

## 2017-12-08 DIAGNOSIS — M150 Primary generalized (osteo)arthritis: Principal | ICD-10-CM

## 2017-12-08 MED ORDER — MELOXICAM 7.5 MG PO TABS: 7.5000 mg | ORAL_TABLET | Freq: Every day | ORAL | 0 refills | Status: DC

## 2017-12-08 MED ORDER — METOPROLOL TARTRATE 25 MG PO TABS: 25.0000 mg | ORAL_TABLET | Freq: Two times a day (BID) | ORAL | 3 refills | Status: DC

## 2017-12-08 MED ORDER — LORAZEPAM 0.5 MG PO TABS
0.5000 mg | ORAL_TABLET | Freq: Two times a day (BID) | ORAL | 2 refills | Status: DC | PRN
Start: 2017-12-08 — End: 2017-12-31

## 2017-12-08 NOTE — Telephone Encounter (Signed)
During AWV, patient stated that she needed a medication refill for lorazepam and also asked if she could have a prescription for mobic. Her arthritis in her hands and feet is acting up and mobic has helped in the past. Scheduled a yearly appointment with PCP for 12/31/17.

## 2017-12-08 NOTE — Patient Instructions (Addendum)
Continue doing brain stimulating activities (puzzles, reading, adult coloring books, staying active) to keep memory sharp.   Continue to eat heart healthy diet (full of fruits, vegetables, whole grains, lean protein, water--limit salt, fat, and sugar intake) and increase physical activity as tolerated.   Jocelyn [name] , Thank you for taking time to come for your Medicare Wellness Visit. I appreciate your ongoing commitment to your health goals. Please review the following plan we discussed and let me know if I can assist you in the future.   These are the goals we discussed: Goals    . Patient Stated     Maintain my current health status, stay as healthy and as independent as possible.       This is a list of the screening recommended for you and due dates:  Health Maintenance  Topic Date Due  . Flu Shot  03/12/2018  . Tetanus Vaccine  09/20/2020  . DEXA scan (bone density measurement)  Completed  . Pneumonia vaccines  Completed   Health Maintenance, Female Adopting a healthy lifestyle and getting preventive care can go a long way to promote health and wellness. Talk with your health care provider about what schedule of regular examinations is right for you. This is a good chance for you to check in with your provider about disease prevention and staying healthy. In between checkups, there are plenty of things you can do on your own. Experts have done a lot of research about which lifestyle changes and preventive measures are most likely to keep you healthy. Ask your health care provider for more information. Weight and diet Eat a healthy diet  Be sure to include plenty of vegetables, fruits, low-fat dairy products, and lean protein.  Do not eat a lot of foods high in solid fats, added sugars, or salt.  Get regular exercise. This is one of the most important things you can do for your health. ? Most adults should exercise for at least 150 minutes each week. The exercise should increase  your heart rate and make you sweat (moderate-intensity exercise). ? Most adults should also do strengthening exercises at least twice a week. This is in addition to the moderate-intensity exercise.  Maintain a healthy weight  Body mass index (BMI) is a measurement that can be used to identify possible weight problems. It estimates body fat based on height and weight. Your health care provider can help determine your BMI and help you achieve or maintain a healthy weight.  For females 81 years of age and older: ? A BMI below 18.5 is considered underweight. ? A BMI of 18.5 to 24.9 is normal. ? A BMI of 25 to 29.9 is considered overweight. ? A BMI of 30 and above is considered obese.  Watch levels of cholesterol and blood lipids  You should start having your blood tested for lipids and cholesterol at 82 years of age, then have this test every 5 years.  You may need to have your cholesterol levels checked more often if: ? Your lipid or cholesterol levels are high. ? You are older than 82 years of age. ? You are at high risk for heart disease.  Cancer screening Lung Cancer  Lung cancer screening is recommended for adults 60-25 years old who are at high risk for lung cancer because of a history of smoking.  A yearly low-dose CT scan of the lungs is recommended for people who: ? Currently smoke. ? Have quit within the past 15 years. ?  Have at least a 30-pack-year history of smoking. A pack year is smoking an average of one pack of cigarettes a day for 1 year.  Yearly screening should continue until it has been 15 years since you quit.  Yearly screening should stop if you develop a health problem that would prevent you from having lung cancer treatment.  Breast Cancer  Practice breast self-awareness. This means understanding how your breasts normally appear and feel.  It also means doing regular breast self-exams. Let your health care provider know about any changes, no matter how  small.  If you are in your 20s or 30s, you should have a clinical breast exam (CBE) by a health care provider every 1-3 years as part of a regular health exam.  If you are 60 or older, have a CBE every year. Also consider having a breast X-ray (mammogram) every year.  If you have a family history of breast cancer, talk to your health care provider about genetic screening.  If you are at high risk for breast cancer, talk to your health care provider about having an MRI and a mammogram every year.  Breast cancer gene (BRCA) assessment is recommended for women who have family members with BRCA-related cancers. BRCA-related cancers include: ? Breast. ? Ovarian. ? Tubal. ? Peritoneal cancers.  Results of the assessment will determine the need for genetic counseling and BRCA1 and BRCA2 testing.  Cervical Cancer Your health care provider may recommend that you be screened regularly for cancer of the pelvic organs (ovaries, uterus, and vagina). This screening involves a pelvic examination, including checking for microscopic changes to the surface of your cervix (Pap test). You may be encouraged to have this screening done every 3 years, beginning at age 32.  For women ages 52-65, health care providers may recommend pelvic exams and Pap testing every 3 years, or they may recommend the Pap and pelvic exam, combined with testing for human papilloma virus (HPV), every 5 years. Some types of HPV increase your risk of cervical cancer. Testing for HPV may also be done on women of any age with unclear Pap test results.  Other health care providers may not recommend any screening for nonpregnant women who are considered low risk for pelvic cancer and who do not have symptoms. Ask your health care provider if a screening pelvic exam is right for you.  If you have had past treatment for cervical cancer or a condition that could lead to cancer, you need Pap tests and screening for cancer for at least 20 years  after your treatment. If Pap tests have been discontinued, your risk factors (such as having a new sexual partner) need to be reassessed to determine if screening should resume. Some women have medical problems that increase the chance of getting cervical cancer. In these cases, your health care provider may recommend more frequent screening and Pap tests.  Colorectal Cancer  This type of cancer can be detected and often prevented.  Routine colorectal cancer screening usually begins at 82 years of age and continues through 82 years of age.  Your health care provider may recommend screening at an earlier age if you have risk factors for colon cancer.  Your health care provider may also recommend using home test kits to check for hidden blood in the stool.  A small camera at the end of a tube can be used to examine your colon directly (sigmoidoscopy or colonoscopy). This is done to check for the earliest forms of colorectal  cancer.  Routine screening usually begins at age 13.  Direct examination of the colon should be repeated every 5-10 years through 82 years of age. However, you may need to be screened more often if early forms of precancerous polyps or small growths are found.  Skin Cancer  Check your skin from head to toe regularly.  Tell your health care provider about any new moles or changes in moles, especially if there is a change in a mole's shape or color.  Also tell your health care provider if you have a mole that is larger than the size of a pencil eraser.  Always use sunscreen. Apply sunscreen liberally and repeatedly throughout the day.  Protect yourself by wearing long sleeves, pants, a wide-brimmed hat, and sunglasses whenever you are outside.  Heart disease, diabetes, and high blood pressure  High blood pressure causes heart disease and increases the risk of stroke. High blood pressure is more likely to develop in: ? People who have blood pressure in the high end of  the normal range (130-139/85-89 mm Hg). ? People who are overweight or obese. ? People who are African American.  If you are 49-24 years of age, have your blood pressure checked every 3-5 years. If you are 62 years of age or older, have your blood pressure checked every year. You should have your blood pressure measured twice-once when you are at a hospital or clinic, and once when you are not at a hospital or clinic. Record the average of the two measurements. To check your blood pressure when you are not at a hospital or clinic, you can use: ? An automated blood pressure machine at a pharmacy. ? A home blood pressure monitor.  If you are between 91 years and 32 years old, ask your health care provider if you should take aspirin to prevent strokes.  Have regular diabetes screenings. This involves taking a blood sample to check your fasting blood sugar level. ? If you are at a normal weight and have a low risk for diabetes, have this test once every three years after 82 years of age. ? If you are overweight and have a high risk for diabetes, consider being tested at a younger age or more often. Preventing infection Hepatitis B  If you have a higher risk for hepatitis B, you should be screened for this virus. You are considered at high risk for hepatitis B if: ? You were born in a country where hepatitis B is common. Ask your health care provider which countries are considered high risk. ? Your parents were born in a high-risk country, and you have not been immunized against hepatitis B (hepatitis B vaccine). ? You have HIV or AIDS. ? You use needles to inject street drugs. ? You live with someone who has hepatitis B. ? You have had sex with someone who has hepatitis B. ? You get hemodialysis treatment. ? You take certain medicines for conditions, including cancer, organ transplantation, and autoimmune conditions.  Hepatitis C  Blood testing is recommended for: ? Everyone born from [birth year]  through 1963. ? Anyone with known risk factors for hepatitis C.  Sexually transmitted infections (STIs)  You should be screened for sexually transmitted infections (STIs) including gonorrhea and chlamydia if: ? You are sexually active and are younger than 82 years of age. ? You are older than 82 years of age and your health care provider tells you that you are at risk for this type of infection. ? Your  sexual activity has changed since you were last screened and you are at an increased risk for chlamydia or gonorrhea. Ask your health care provider if you are at risk.  If you do not have HIV, but are at risk, it may be recommended that you take a prescription medicine daily to prevent HIV infection. This is called pre-exposure prophylaxis (PrEP). You are considered at risk if: ? You are sexually active and do not regularly use condoms or know the HIV status of your partner(s). ? You take drugs by injection. ? You are sexually active with a partner who has HIV.  Talk with your health care provider about whether you are at high risk of being infected with HIV. If you choose to begin PrEP, you should first be tested for HIV. You should then be tested every 3 months for as long as you are taking PrEP. Pregnancy  If you are premenopausal and you may become pregnant, ask your health care provider about preconception counseling.  If you may become pregnant, take 400 to 800 micrograms (mcg) of folic acid every day.  If you want to prevent pregnancy, talk to your health care provider about birth control (contraception). Osteoporosis and menopause  Osteoporosis is a disease in which the bones lose minerals and strength with aging. This can result in serious bone fractures. Your risk for osteoporosis can be identified using a bone density scan.  If you are 13 years of age or older, or if you are at risk for osteoporosis and fractures, ask your health care provider if you should be screened.  Ask  your health care provider whether you should take a calcium or vitamin D supplement to lower your risk for osteoporosis.  Menopause may have certain physical symptoms and risks.  Hormone replacement therapy may reduce some of these symptoms and risks. Talk to your health care provider about whether hormone replacement therapy is right for you. Follow these instructions at home:  Schedule regular health, dental, and eye exams.  Stay current with your immunizations.  Do not use any tobacco products including cigarettes, chewing tobacco, or electronic cigarettes.  If you are pregnant, do not drink alcohol.  If you are breastfeeding, limit how much and how often you drink alcohol.  Limit alcohol intake to no more than 1 drink per day for nonpregnant women. One drink equals 12 ounces of beer, 5 ounces of wine, or 1 ounces of hard liquor.  Do not use street drugs.  Do not share needles.  Ask your health care provider for help if you need support or information about quitting drugs.  Tell your health care provider if you often feel depressed.  Tell your health care provider if you have ever been abused or do not feel safe at home. This information is not intended to replace advice given to you by your health care provider. Make sure you discuss any questions you have with your health care provider. Document Released: 02/11/2011 Document Revised: 01/04/2016 Document Reviewed: 05/02/2015 Elsevier Interactive Patient Education  [employer]. It is important to avoid accidents which may result in broken bones.  Here are a few ideas on how to make your home safer so you will be less likely to trip or fall.  1. Use nonskid mats or non slip strips in your shower or tub, on your bathroom floor and around sinks.  If you know that you have spilled water, wipe it up! 2. In the bathroom, it is important to have  properly installed grab bars on the walls or on the edge of the tub.  Towel racks  are NOT strong enough for you to hold onto or to pull on for support. 3. Stairs and hallways should have enough light.  Add lamps or night lights if you need ore light. 4. It is good to have handrails on both sides of the stairs if possible.  Always fix broken handrails right away. 5. It is important to see the edges of steps.  Paint the edges of outdoor steps white so you can see them better.  Put colored tape on the edge of inside steps. 6. Throw-rugs are dangerous because they can slide.  Removing the rugs is the best idea, but if they must stay, add adhesive carpet tape to prevent slipping. 7. Do not keep things on stairs or in the halls.  Remove small furniture that blocks the halls as it may cause you to trip.  Keep telephone and electrical cords out of the way where you walk. 8. Always were sturdy, rubber-soled shoes for good support.  Never wear just socks, especially on the stairs.  Socks may cause you to slip or fall.  Do not wear full-length housecoats as you can easily trip on the bottom.  9. Place the things you use the most on the shelves that are the easiest to reach.  If you use a stepstool, make sure it is in good condition.  If you feel unsteady, DO NOT climb, ask for help. 10. If a health professional advises you to use a cane or walker, do not be ashamed.  These items can keep you from falling and breaking your bones.

## 2017-12-31 ENCOUNTER — Other Ambulatory Visit (INDEPENDENT_AMBULATORY_CARE_PROVIDER_SITE_OTHER): Payer: Medicare Other

## 2017-12-31 ENCOUNTER — Encounter: Payer: Self-pay | Admitting: Internal Medicine

## 2017-12-31 ENCOUNTER — Ambulatory Visit (INDEPENDENT_AMBULATORY_CARE_PROVIDER_SITE_OTHER): Payer: Medicare Other | Admitting: Internal Medicine

## 2017-12-31 VITALS — BP 140/80 | HR 87 | Ht 66.0 in | Wt 160.0 lb

## 2017-12-31 DIAGNOSIS — M8949 Other hypertrophic osteoarthropathy, multiple sites: Secondary | ICD-10-CM

## 2017-12-31 DIAGNOSIS — Z0000 Encounter for general adult medical examination without abnormal findings: Secondary | ICD-10-CM | POA: Diagnosis not present

## 2017-12-31 DIAGNOSIS — E785 Hyperlipidemia, unspecified: Secondary | ICD-10-CM

## 2017-12-31 DIAGNOSIS — F418 Other specified anxiety disorders: Secondary | ICD-10-CM | POA: Diagnosis not present

## 2017-12-31 DIAGNOSIS — M159 Polyosteoarthritis, unspecified: Secondary | ICD-10-CM

## 2017-12-31 DIAGNOSIS — I10 Essential (primary) hypertension: Secondary | ICD-10-CM

## 2017-12-31 DIAGNOSIS — M150 Primary generalized (osteo)arthritis: Secondary | ICD-10-CM

## 2017-12-31 LAB — COMPREHENSIVE METABOLIC PANEL
ALT: 6 U/L (ref 0–35)
AST: 7 U/L (ref 0–37)
Albumin: 3.9 g/dL (ref 3.5–5.2)
Alkaline Phosphatase: 59 U/L (ref 39–117)
BUN: 13 mg/dL (ref 6–23)
CO2: 29 mEq/L (ref 19–32)
Calcium: 10.4 mg/dL (ref 8.4–10.5)
Chloride: 106 mEq/L (ref 96–112)
Creatinine, Ser: 1 mg/dL (ref 0.40–1.20)
GFR: 56.21 mL/min — ABNORMAL LOW (ref >60.00)
Glucose, Bld: 100 mg/dL — ABNORMAL HIGH (ref 70–99)
Potassium: 3.9 mEq/L (ref 3.5–5.1)
Sodium: 141 mEq/L (ref 135–145)
Total Bilirubin: 0.6 mg/dL (ref 0.2–1.2)
Total Protein: 6.3 g/dL (ref 6.0–8.3)

## 2017-12-31 LAB — LIPID PANEL
Cholesterol: 184 mg/dL (ref 0–200)
HDL: 53.9 mg/dL (ref >39.00)
LDL Cholesterol: 93 mg/dL (ref 0–99)
NonHDL: 129.74
Total CHOL/HDL Ratio: 3
Triglycerides: 185 mg/dL — ABNORMAL HIGH (ref 0.0–149.0)
VLDL: 37 mg/dL (ref 0.0–40.0)

## 2017-12-31 LAB — CBC WITH DIFFERENTIAL/PLATELET
Basophils Absolute: 0.1 10*3/uL (ref 0.0–0.1)
Basophils Relative: 1.1 % (ref 0.0–3.0)
Eosinophils Absolute: 0.3 10*3/uL (ref 0.0–0.7)
Eosinophils Relative: 4 % (ref 0.0–5.0)
HCT: 41.5 % (ref 36.0–46.0)
Hemoglobin: 14.2 g/dL (ref 12.0–15.0)
Lymphocytes Relative: 30.7 % (ref 12.0–46.0)
Lymphs Abs: 2.6 10*3/uL (ref 0.7–4.0)
MCHC: 34.2 g/dL (ref 30.0–36.0)
MCV: 90.9 fl (ref 78.0–100.0)
Monocytes Absolute: 0.8 10*3/uL (ref 0.1–1.0)
Monocytes Relative: 10.1 % (ref 3.0–12.0)
Neutro Abs: 4.6 10*3/uL (ref 1.4–7.7)
Neutrophils Relative %: 54.1 % (ref 43.0–77.0)
Platelets: 283 10*3/uL (ref 150.0–400.0)
RBC: 4.57 Mil/uL (ref 3.87–5.11)
RDW: 12.7 % (ref 11.5–15.5)
WBC: 8.4 10*3/uL (ref 4.0–10.5)

## 2017-12-31 LAB — TSH: TSH: 1.39 u[IU]/mL (ref 0.35–4.50)

## 2017-12-31 MED ORDER — LORAZEPAM 0.5 MG PO TABS
0.5000 mg | ORAL_TABLET | Freq: Two times a day (BID) | ORAL | 2 refills | Status: DC | PRN
Start: 2017-12-31 — End: 2020-01-25

## 2017-12-31 MED ORDER — METOPROLOL TARTRATE 25 MG PO TABS: 25.0000 mg | ORAL_TABLET | Freq: Two times a day (BID) | ORAL | 1 refills | Status: DC

## 2017-12-31 MED ORDER — MELOXICAM 7.5 MG PO TABS: 7.5000 mg | ORAL_TABLET | Freq: Every day | ORAL | 1 refills | Status: DC

## 2017-12-31 NOTE — Patient Instructions (Signed)

## 2017-12-31 NOTE — Progress Notes (Signed)
Subjective:  Patient ID: Jocelyn [name], female    DOB: March 08, 1932  Age: 84 y.o. MRN: [id number]  CC: Annual Exam and Hypertension   HPI Jocelyn [name] presents for a CPX.  She tells me her blood pressure has been well controlled.  She denies any recent episodes of palpitations, chest pain, shortness of breath, dizziness, or lightheadedness.  She occasionally suffers from anxiety and insomnia and wants a refill of Ativan.  She has occasional pain in her large joints and wants a refill of meloxicam.  Past Medical History:  Diagnosis Date  . HTN (hypertension)   . Hyperlipidemia   . Urinary incontinence    Past Surgical History:  Procedure Laterality Date  . ABDOMINAL HYSTERECTOMY    . TONSILLECTOMY      reports that she has quit smoking. She quit smokeless tobacco use about 9 months ago. She reports that she drinks about 8.4 oz of alcohol per week. She reports that she does not use drugs. family history includes Diabetes in her unknown relative; Hyperlipidemia in her unknown relative; Stroke in her unknown relative. Allergies  Allergen Reactions  . Codeine     REACTION: nausea, itching    Outpatient Medications Prior to Visit  Medication Sig Dispense Refill  . aspirin 81 MG tablet Take 81 mg by mouth daily.    Marland Kitchen b complex vitamins tablet Take 1 tablet by mouth daily.    . Omega-3 Fatty Acids (FISH OIL PO) Take 1 capsule by mouth daily.    Marland Kitchen LORazepam (ATIVAN) 0.5 MG tablet Take 1 tablet (0.5 mg total) by mouth 2 (two) times daily as needed for anxiety. 60 tablet 2  . meloxicam (MOBIC) 7.5 MG tablet Take 1 tablet (7.5 mg total) by mouth daily. 90 tablet 0  . metoprolol tartrate (LOPRESSOR) 25 MG tablet Take 1 tablet (25 mg total) by mouth 2 (two) times daily. 180 tablet 3   No facility-administered medications prior to visit.     ROS Review of Systems  Constitutional: Negative.  Negative for diaphoresis, fatigue and unexpected weight change.  HENT: Negative.   Eyes: Negative  for visual disturbance.  Respiratory: Negative for cough, chest tightness and shortness of breath.   Cardiovascular: Negative for chest pain, palpitations and leg swelling.  Gastrointestinal: Negative for abdominal pain, constipation, diarrhea and nausea.  Endocrine: Negative.   Genitourinary: Negative.  Negative for decreased urine volume, difficulty urinating and dysuria.  Musculoskeletal: Positive for arthralgias. Negative for back pain and neck pain.  Skin: Negative for color change and pallor.  Allergic/Immunologic: Negative.   Neurological: Negative.  Negative for dizziness, weakness, light-headedness and headaches.  Hematological: Negative for adenopathy. Does not bruise/bleed easily.  Psychiatric/Behavioral: Positive for sleep disturbance. Negative for behavioral problems, confusion and dysphoric mood. The patient is nervous/anxious.     Objective:  BP 140/80 (BP Location: Left Arm, Patient Position: Sitting, Cuff Size: Normal)   Pulse 87   Ht 5\' 6"  (1.676 m)   Wt 160 lb (72.6 kg)   SpO2 96%   BMI 25.82 kg/m   BP Readings from Last 3 Encounters:  12/31/17 140/80  12/08/17 132/84  07/04/17 (!) 144/81    Wt Readings from Last 3 Encounters:  12/31/17 160 lb (72.6 kg)  12/08/17 159 lb (72.1 kg)  03/18/17 167 lb 8 oz (76 kg)    Physical Exam  Constitutional: She is oriented to person, place, and time. No distress.  HENT:  Mouth/Throat: Oropharynx is clear and moist. No oropharyngeal exudate.  Eyes: Conjunctivae  are normal. No scleral icterus.  Neck: Normal range of motion. Neck supple. No JVD present. No thyromegaly present.  Cardiovascular: Normal rate, regular rhythm and normal heart sounds. Exam reveals no gallop.  No murmur heard. Pulmonary/Chest: Effort normal and breath sounds normal. She has no wheezes. She has no rales.  Abdominal: Soft. Normal appearance and bowel sounds are normal. She exhibits no mass. There is no hepatosplenomegaly. There is no tenderness.  No hernia.  Musculoskeletal: Normal range of motion. She exhibits no edema or deformity.  Lymphadenopathy:    She has no cervical adenopathy.  Neurological: She is alert and oriented to person, place, and time.  Skin: Skin is warm and dry. She is not diaphoretic.  Psychiatric: She has a normal mood and affect. Her behavior is normal. Judgment and thought content normal.  Vitals reviewed.   Lab Results  Component Value Date   WBC 8.4 12/31/2017   HGB 14.2 12/31/2017   HCT 41.5 12/31/2017   PLT 283.0 12/31/2017   GLUCOSE 100 (H) 12/31/2017   CHOL 184 12/31/2017   TRIG 185.0 (H) 12/31/2017   HDL 53.90 12/31/2017   LDLDIRECT 128.9 04/08/2011   LDLCALC 93 12/31/2017   ALT 6 12/31/2017   AST 7 12/31/2017   NA 141 12/31/2017   K 3.9 12/31/2017   CL 106 12/31/2017   CREATININE 1.00 12/31/2017   BUN 13 12/31/2017   CO2 29 12/31/2017   TSH 1.39 12/31/2017   HGBA1C 5.5 06/09/2013    No results found.  Assessment & Plan:   Jocelyn [name] was seen today for annual exam and hypertension.  Diagnoses and all orders for this visit:  Dyslipidemia, goal LDL below 130- Statin therapy is not indicated -     Lipid panel; Future -     TSH; Future -     Comprehensive metabolic panel; Future  Essential hypertension- Her blood pressure is well controlled.  Electrolytes and renal function are normal. -     CBC with Differential/Platelet; Future -     Comprehensive metabolic panel; Future -     metoprolol tartrate (LOPRESSOR) 25 MG tablet; Take 1 tablet (25 mg total) by mouth 2 (two) times daily.  Primary osteoarthritis involving multiple joints -     meloxicam (MOBIC) 7.5 MG tablet; Take 1 tablet (7.5 mg total) by mouth daily.  Depression with anxiety -     LORazepam (ATIVAN) 0.5 MG tablet; Take 1 tablet (0.5 mg total) by mouth 2 (two) times daily as needed for anxiety.   I am having Jocelyn [name] maintain her aspirin, b complex vitamins, Omega-3 Fatty Acids (FISH OIL PO), meloxicam, metoprolol  tartrate, and LORazepam.  Meds ordered this encounter  Medications  . meloxicam (MOBIC) 7.5 MG tablet    Sig: Take 1 tablet (7.5 mg total) by mouth daily.    Dispense:  90 tablet    Refill:  1  . metoprolol tartrate (LOPRESSOR) 25 MG tablet    Sig: Take 1 tablet (25 mg total) by mouth 2 (two) times daily.    Dispense:  180 tablet    Refill:  1  . LORazepam (ATIVAN) 0.5 MG tablet    Sig: Take 1 tablet (0.5 mg total) by mouth 2 (two) times daily as needed for anxiety.    Dispense:  60 tablet    Refill:  2   See AVS for instructions about healthy living and anticipatory guidance.  Follow-up: Return in about 6 months (around 07/03/2018).  Scarlette Calico, MD

## 2018-01-01 ENCOUNTER — Encounter: Payer: Self-pay | Admitting: Internal Medicine

## 2018-01-02 NOTE — Assessment & Plan Note (Signed)

## 2018-05-07 DIAGNOSIS — Z23 Encounter for immunization: Secondary | ICD-10-CM | POA: Diagnosis not present
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
## 2018-08-28 IMAGING — CR DG ELBOW COMPLETE 3+V*L*
4 series · 4 of 4 positions shown · non-contrast
Comparison: None.

CLINICAL DATA: Fell this evening, landing on her left side.
Persistent pain.

EXAM:
LEFT ELBOW - COMPLETE 3+ VIEW

[x elbow ap left]
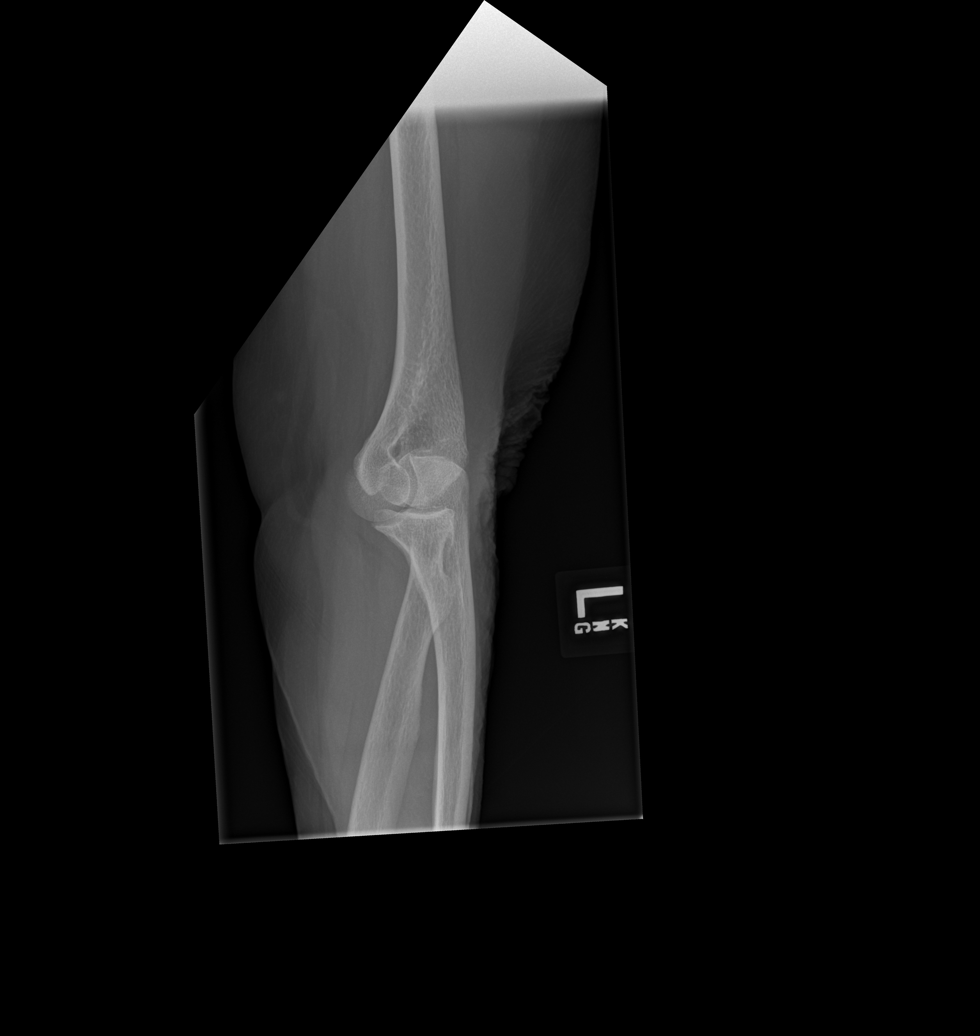

[x elbow obl left (1 of 2)]
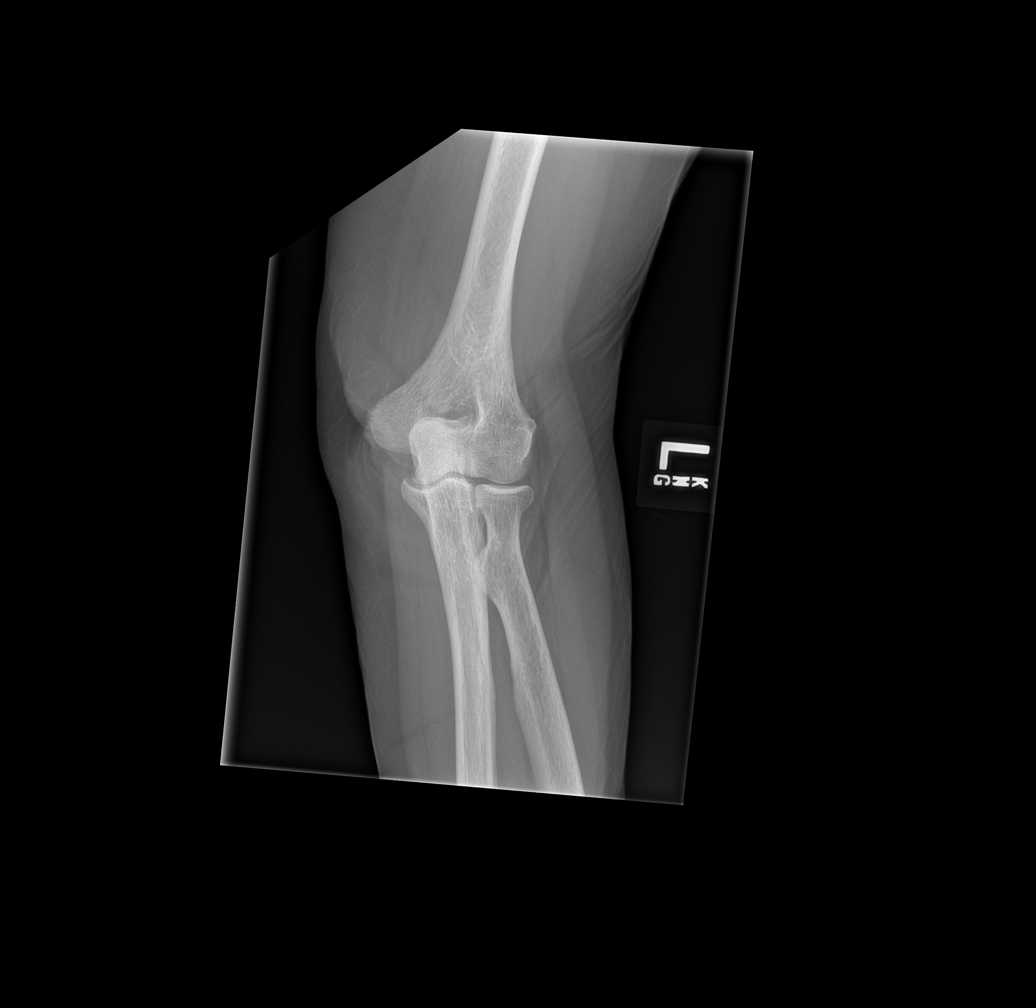

[x elbow obl left (2 of 2)]
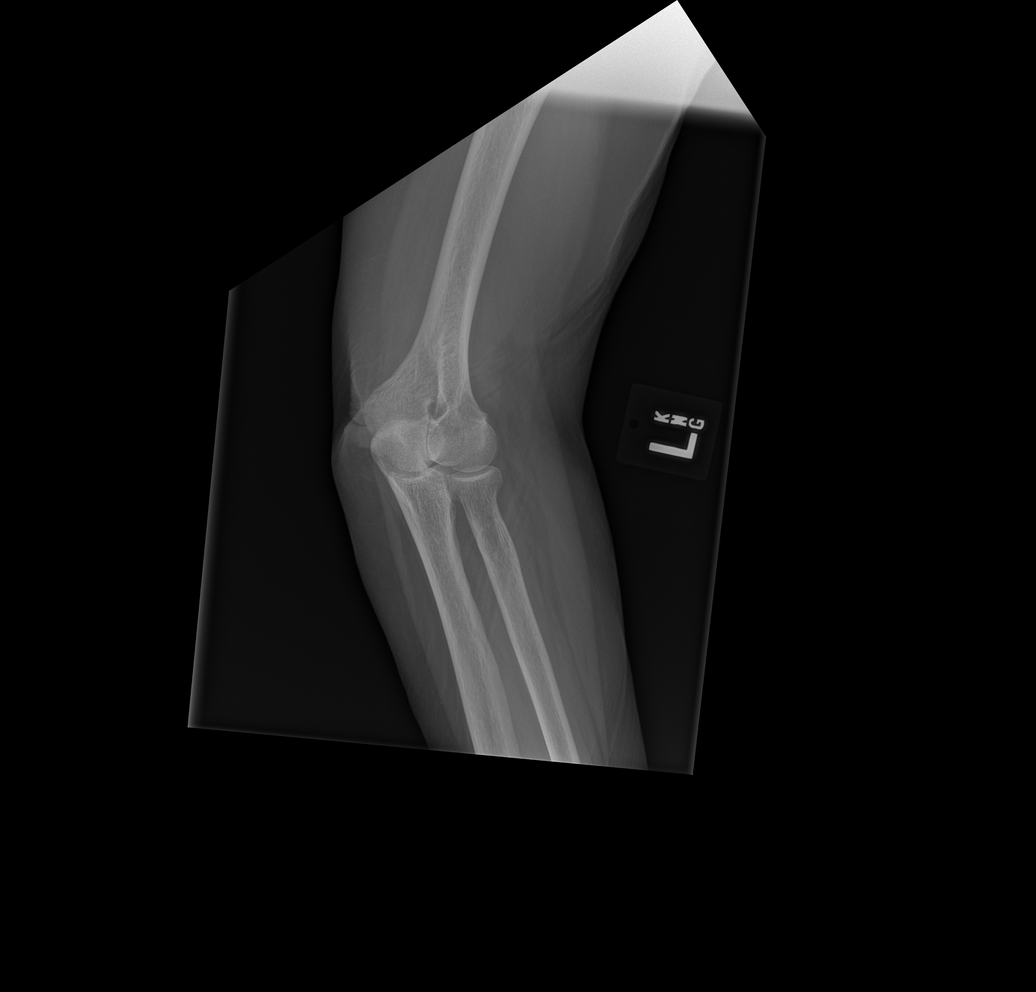

[x elbow lat left]
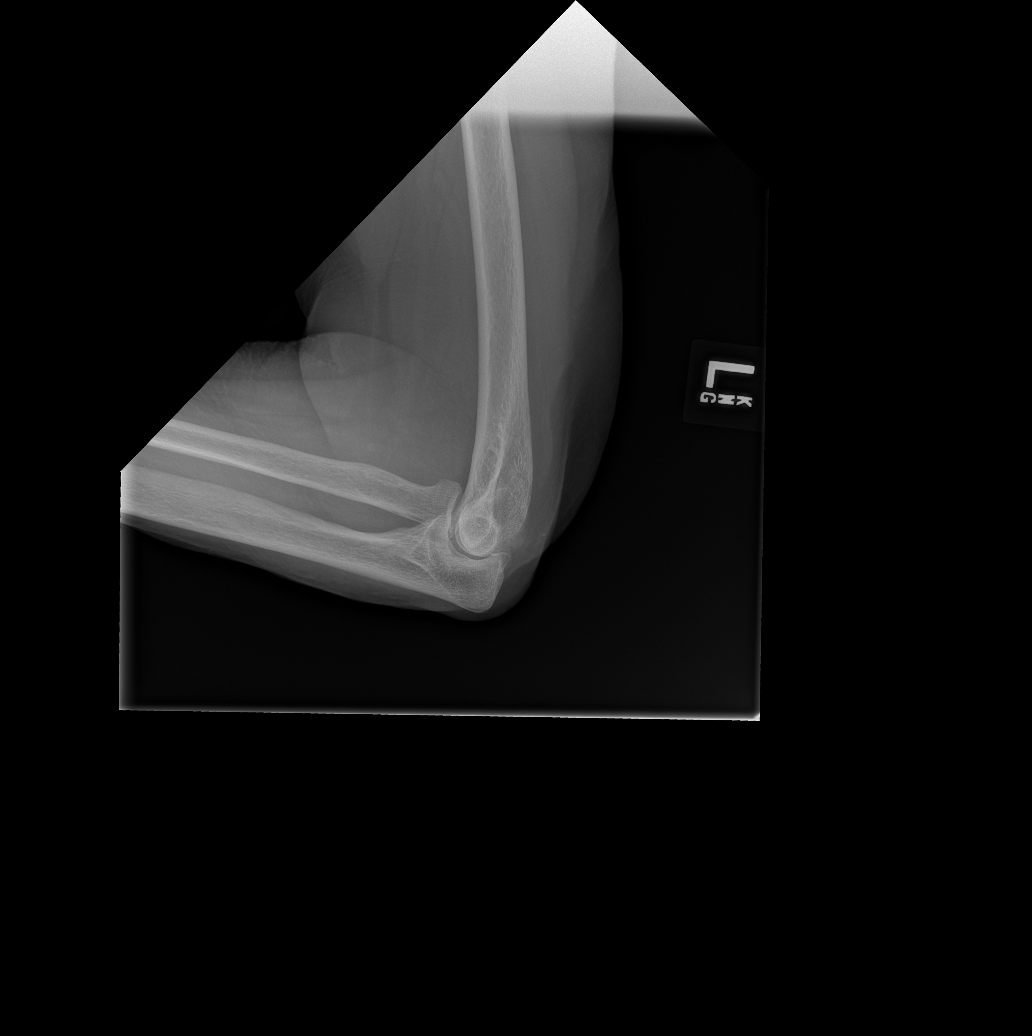

[4 of 4 positions shown; findings below may reference images not displayed]

FINDINGS: There is no evidence of fracture, dislocation, or joint effusion.
There is no evidence of arthropathy or other focal bone abnormality.
Soft tissues are unremarkable.
IMPRESSION: Negative.

## 2019-01-06 ENCOUNTER — Ambulatory Visit: Payer: BLUE CROSS/BLUE SHIELD

## 2019-03-11 ENCOUNTER — Other Ambulatory Visit: Payer: Self-pay

## 2019-06-23 DIAGNOSIS — Z23 Encounter for immunization: Secondary | ICD-10-CM | POA: Diagnosis not present

## 2019-07-06 ENCOUNTER — Other Ambulatory Visit: Payer: Self-pay

## 2019-12-15 DIAGNOSIS — Z23 Encounter for immunization: Secondary | ICD-10-CM | POA: Diagnosis not present

## 2020-01-12 DIAGNOSIS — Z23 Encounter for immunization: Secondary | ICD-10-CM | POA: Diagnosis not present

## 2020-01-21 ENCOUNTER — Telehealth: Payer: Self-pay

## 2020-01-21 ENCOUNTER — Telehealth: Payer: Self-pay | Admitting: Internal Medicine

## 2020-01-21 NOTE — Telephone Encounter (Signed)
Called patient to f/u (who was with granddaughter that called).  Pt states that they called EMS as instructed & after their assessment was advised to get a CT scan.  Pt denies blurred vision, numbness, h/a, & mouth tingling at this time.  Granddaughter shared that while pt has c/o dizziness for a few weeks, the symptoms above were new & caused concern. Granddaughter states she plans to take patient home with her for the weekend & requests f/u from Dr Ronnald Ramp as to next steps, if any; encouraged to call EMS again if symptoms return. Pt encouraged to make appt since she has not had an office visit since 12/31/17.  Appt made for 01/25/20 at 1040 with Dr Ronnald Ramp.

## 2020-01-21 NOTE — Telephone Encounter (Signed)
Opened in error

## 2020-01-21 NOTE — Telephone Encounter (Signed)
New message:    Pt's granddaughter is calling and states the pt is having tingling and numbness in her hands and in her lips. I have transferred the pt to [facility] to a lady named Myriam Jacobson.

## 2020-01-21 NOTE — Telephone Encounter (Signed)
[facility] Report : blurred vison, numbness in hands and lips, headache, is on left side only, mouth is tingling, started this morning sending to urgent due to her age and symptoms.  Advised call EMS now.

## 2020-01-25 ENCOUNTER — Encounter: Payer: Self-pay | Admitting: Internal Medicine

## 2020-01-25 ENCOUNTER — Ambulatory Visit (INDEPENDENT_AMBULATORY_CARE_PROVIDER_SITE_OTHER): Payer: Medicare Other | Admitting: Internal Medicine

## 2020-01-25 ENCOUNTER — Other Ambulatory Visit: Payer: Self-pay

## 2020-01-25 VITALS — BP 150/90 | HR 80 | Temp 97.9°F | Ht 66.0 in | Wt 156.4 lb

## 2020-01-25 DIAGNOSIS — G8929 Other chronic pain: Secondary | ICD-10-CM

## 2020-01-25 DIAGNOSIS — F322 Major depressive disorder, single episode, severe without psychotic features: Secondary | ICD-10-CM

## 2020-01-25 DIAGNOSIS — N1832 Chronic kidney disease, stage 3b: Secondary | ICD-10-CM

## 2020-01-25 DIAGNOSIS — R270 Ataxia, unspecified: Secondary | ICD-10-CM

## 2020-01-25 DIAGNOSIS — R519 Headache, unspecified: Secondary | ICD-10-CM | POA: Insufficient documentation

## 2020-01-25 DIAGNOSIS — G63 Polyneuropathy in diseases classified elsewhere: Secondary | ICD-10-CM

## 2020-01-25 DIAGNOSIS — E538 Deficiency of other specified B group vitamins: Secondary | ICD-10-CM

## 2020-01-25 DIAGNOSIS — I10 Essential (primary) hypertension: Secondary | ICD-10-CM

## 2020-01-25 DIAGNOSIS — R300 Dysuria: Secondary | ICD-10-CM

## 2020-01-25 DIAGNOSIS — G3281 Cerebellar ataxia in diseases classified elsewhere: Secondary | ICD-10-CM

## 2020-01-25 LAB — CBC WITH DIFFERENTIAL/PLATELET
Basophils Absolute: 0.1 10*3/uL (ref 0.0–0.1)
Basophils Relative: 0.9 % (ref 0.0–3.0)
Eosinophils Absolute: 0.4 10*3/uL (ref 0.0–0.7)
Eosinophils Relative: 4.8 % (ref 0.0–5.0)
HCT: 41.9 % (ref 36.0–46.0)
Hemoglobin: 14 g/dL (ref 12.0–15.0)
Lymphocytes Relative: 20.6 % (ref 12.0–46.0)
Lymphs Abs: 1.7 10*3/uL (ref 0.7–4.0)
MCHC: 33.5 g/dL (ref 30.0–36.0)
MCV: 91.4 fl (ref 78.0–100.0)
Monocytes Absolute: 0.9 10*3/uL (ref 0.1–1.0)
Monocytes Relative: 10.4 % (ref 3.0–12.0)
Neutro Abs: 5.4 10*3/uL (ref 1.4–7.7)
Neutrophils Relative %: 63.3 % (ref 43.0–77.0)
Platelets: 248 10*3/uL (ref 150.0–400.0)
RBC: 4.58 Mil/uL (ref 3.87–5.11)
RDW: 12.9 % (ref 11.5–15.5)
WBC: 8.5 10*3/uL (ref 4.0–10.5)

## 2020-01-25 LAB — POC URINALSYSI DIPSTICK (AUTOMATED)
Bilirubin, UA: NEGATIVE
Blood, UA: NEGATIVE
Glucose, UA: NEGATIVE
Ketones, UA: NEGATIVE
Leukocytes, UA: NEGATIVE
Nitrite, UA: NEGATIVE
Protein, UA: NEGATIVE
Spec Grav, UA: 1.02 (ref 1.010–1.025)
Urobilinogen, UA: 0.2 E.U./dL
pH, UA: 6 (ref 5.0–8.0)

## 2020-01-25 LAB — VITAMIN B12: Vitamin B-12: 168 pg/mL — ABNORMAL LOW (ref 211–911)

## 2020-01-25 LAB — BASIC METABOLIC PANEL
BUN: 16 mg/dL (ref 6–23)
CO2: 25 mEq/L (ref 19–32)
Calcium: 10.1 mg/dL (ref 8.4–10.5)
Chloride: 107 mEq/L (ref 96–112)
Creatinine, Ser: 0.97 mg/dL (ref 0.40–1.20)
GFR: 54.51 mL/min — ABNORMAL LOW (ref >60.00)
Glucose, Bld: 107 mg/dL — ABNORMAL HIGH (ref 70–99)
Potassium: 4 mEq/L (ref 3.5–5.1)
Sodium: 136 mEq/L (ref 135–145)

## 2020-01-25 LAB — HEPATIC FUNCTION PANEL
ALT: 7 U/L (ref 0–35)
AST: 12 U/L (ref 0–37)
Albumin: 4 g/dL (ref 3.5–5.2)
Alkaline Phosphatase: 51 U/L (ref 39–117)
Bilirubin, Direct: 0.1 mg/dL (ref 0.0–0.3)
Total Bilirubin: 0.5 mg/dL (ref 0.2–1.2)
Total Protein: 6.3 g/dL (ref 6.0–8.3)

## 2020-01-25 LAB — TSH: TSH: 1.09 u[IU]/mL (ref 0.35–4.50)

## 2020-01-25 LAB — FOLATE: Folate: 8.9 ng/mL (ref >5.9)

## 2020-01-25 MED ORDER — DULOXETINE HCL 30 MG PO CPEP: 30.0000 mg | ORAL_CAPSULE | Freq: Every day | ORAL | 0 refills | Status: DC

## 2020-01-25 NOTE — Patient Instructions (Signed)
Major Depressive Disorder, Adult Major depressive disorder (MDD) is a mental health condition. It may also be called clinical depression or unipolar depression. MDD usually causes feelings of sadness, hopelessness, or helplessness. MDD can also cause physical symptoms. It can interfere with work, school, relationships, and other everyday activities. MDD may be mild, moderate, or severe. It may occur once (single episode major depressive disorder) or it may occur multiple times (recurrent major depressive disorder). What are the causes? The exact cause of this condition is not known. MDD is most likely caused by a combination of things, which may include:  Genetic factors. These are traits that are passed along from parent to child.  Individual factors. Your personality, your behavior, and the way you handle your thoughts and feelings may contribute to MDD. This includes personality traits and behaviors learned from others.  Physical factors, such as: ? Differences in the part of your brain that controls emotion. This part of your brain may be different than it is in people who do not have MDD. ? Long-term (chronic) medical or psychiatric illnesses.  Social factors. Traumatic experiences or major life changes may play a role in the development of MDD. What increases the risk? This condition is more likely to develop in women. The following factors may also make you more likely to develop MDD:  A family history of depression.  Troubled family relationships.  Abnormally low levels of certain brain chemicals.  Traumatic events in childhood, especially abuse or the loss of a parent.  Being under a lot of stress, or long-term stress, especially from upsetting life experiences or losses.  A history of: ? Chronic physical illness. ? Other mental health disorders. ? Substance abuse.  Poor living conditions.  Experiencing social exclusion or discrimination on a regular basis. What are the  signs or symptoms? The main symptoms of MDD typically include:  Constant depressed or irritable mood.  Loss of interest in things and activities. MDD symptoms may also include:  Sleeping or eating too much or too little.  Unexplained weight change.  Fatigue or low energy.  Feelings of worthlessness or guilt.  Difficulty thinking clearly or making decisions.  Thoughts of suicide or of harming others.  Physical agitation or weakness.  Isolation. Severe cases of MDD may also occur with other symptoms, such as:  Delusions or hallucinations, in which you imagine things that are not real (psychotic depression).  Low-level depression that lasts at least a year (chronic depression or persistent depressive disorder).  Extreme sadness and hopelessness (melancholic depression).  Trouble speaking and moving (catatonic depression). How is this diagnosed? This condition may be diagnosed based on:  Your symptoms.  Your medical history, including your mental health history. This may involve tests to evaluate your mental health. You may be asked questions about your lifestyle, including any drug and alcohol use, and how long you have had symptoms of MDD.  A physical exam.  Blood tests to rule out other conditions. You must have a depressed mood and at least four other MDD symptoms most of the day, nearly every day in the same 2-week timeframe before your health care provider can confirm a diagnosis of MDD. How is this treated? This condition is usually treated by mental health professionals, such as psychologists, psychiatrists, and clinical social workers. You may need more than one type of treatment. Treatment may include:  Psychotherapy. This is also called talk therapy or counseling. Types of psychotherapy include: ? Cognitive behavioral therapy (CBT). This type of therapy   teaches you to recognize unhealthy feelings, thoughts, and behaviors, and replace them with positive thoughts  and actions. ? Interpersonal therapy (IPT). This helps you to improve the way you relate to and communicate with others. ? Family therapy. This treatment includes members of your family.  Medicine to treat anxiety and depression, or to help you control certain emotions and behaviors.  Lifestyle changes, such as: ? Limiting alcohol and drug use. ? Exercising regularly. ? Getting plenty of sleep. ? Making healthy eating choices. ? Spending more time outdoors.  Treatments involving stimulation of the brain can be used in situations with extremely severe symptoms, or when medicine or other therapies do not work over time. These treatments include electroconvulsive therapy, transcranial magnetic stimulation, and vagal nerve stimulation. Follow these instructions at home: Activity  Return to your normal activities as told by your health care provider.  Exercise regularly and spend time outdoors as told by your health care provider. General instructions  Take over-the-counter and prescription medicines only as told by your health care provider.  Do not drink alcohol. If you drink alcohol, limit your alcohol intake to no more than 1 drink a day for nonpregnant women and 2 drinks a day for men. One drink equals 12 oz of beer, 5 oz of wine, or 1 oz of hard liquor. Alcohol can affect any antidepressant medicines you are taking. Talk to your health care provider about your alcohol use.  Eat a healthy diet and get plenty of sleep.  Find activities that you enjoy doing, and make time to do them.  Consider joining a support group. Your health care provider may be able to recommend a support group.  Keep all follow-up visits as told by your health care provider. This is important. Where to find more information National Alliance on Mental Illness  www.nami.org [place] National Institute of Mental Health  www.nimh.nih.gov National Suicide Prevention Lifeline  1-800-273-TALK (8255). This is  free, 24-hour help. Contact a health care provider if:  Your symptoms get worse.  You develop new symptoms. Get help right away if:  You self-harm.  You have serious thoughts about hurting yourself or others.  You see, hear, taste, smell, or feel things that are not present (hallucinate). This information is not intended to replace advice given to you by your health care provider. Make sure you discuss any questions you have with your health care provider. Document Revised: 07/11/2017 Document Reviewed: 02/07/2016 Elsevier Patient Education  2020 [employer]  

## 2020-01-25 NOTE — Progress Notes (Signed)
Subjective:  Patient ID: Jocelyn [name], female    DOB: Dec 20, 1931  Age: 86 y.o. MRN: [id number]  CC: Hypertension  This visit occurred during the SARS-CoV-2 public health emergency.  Safety protocols were in place, including screening questions prior to the visit, additional usage of staff PPE, and extensive cleaning of exam room while observing appropriate contact time as indicated for disinfecting solutions.    HPI Jocelyn [name] presents for f/up - She comes in with her granddaughter today who helps with the history.  Jocelyn [name] has been doing poorly for about 3 months and now worsening over the last month.  She has had episodes of dizziness, ataxia, numbness and tingling in her lips, a frontal headache, poor appetite, sleeping during the day, fatigue, anhedonia, sadness, and anxiety.  She describes a frontal headache that is a throbbing sensation with blurred vision and nausea but no vomiting.  Outpatient Medications Prior to Visit  Medication Sig Dispense Refill  . b complex vitamins tablet Take 1 tablet by mouth daily. (Patient not taking: Reported on 01/25/2020)    . aspirin 81 MG tablet Take 81 mg by mouth daily. (Patient not taking: Reported on 01/25/2020)    . LORazepam (ATIVAN) 0.5 MG tablet Take 1 tablet (0.5 mg total) by mouth 2 (two) times daily as needed for anxiety. (Patient not taking: Reported on 01/25/2020) 60 tablet 2  . meloxicam (MOBIC) 7.5 MG tablet Take 1 tablet (7.5 mg total) by mouth daily. (Patient not taking: Reported on 01/25/2020) 90 tablet 1  . metoprolol tartrate (LOPRESSOR) 25 MG tablet Take 1 tablet (25 mg total) by mouth 2 (two) times daily. (Patient not taking: Reported on 01/25/2020) 180 tablet 1  . Omega-3 Fatty Acids (FISH OIL PO) Take 1 capsule by mouth daily. (Patient not taking: Reported on 01/25/2020)     No facility-administered medications prior to visit.    ROS Review of Systems  Constitutional: Positive for fatigue. Negative for appetite change, chills,  diaphoresis and unexpected weight change.  HENT: Negative.  Negative for trouble swallowing.   Eyes: Positive for visual disturbance. Negative for pain.  Respiratory: Negative for cough, chest tightness, shortness of breath and wheezing.   Cardiovascular: Negative for chest pain, palpitations and leg swelling.  Gastrointestinal: Negative for abdominal pain, constipation, diarrhea, nausea and vomiting.  Endocrine: Negative.   Genitourinary: Positive for dysuria. Negative for decreased urine volume, difficulty urinating, flank pain, frequency, hematuria and urgency.  Musculoskeletal: Positive for gait problem. Negative for arthralgias, back pain, myalgias and neck pain.  Skin: Negative for color change, pallor and rash.  Neurological: Positive for weakness, numbness and headaches. Negative for dizziness, tremors, seizures, syncope, facial asymmetry, speech difficulty and light-headedness.  Hematological: Negative for adenopathy. Does not bruise/bleed easily.  Psychiatric/Behavioral: Positive for confusion, decreased concentration, dysphoric mood and sleep disturbance. Negative for agitation, behavioral problems, hallucinations, self-injury and suicidal ideas. The patient is nervous/anxious. The patient is not hyperactive.     Objective:  BP (!) 150/90 (BP Location: Left Arm, Patient Position: Sitting, Cuff Size: Normal)   Pulse 80   Temp 97.9 F (36.6 C) (Oral)   Ht 5\' 6"  (1.676 m)   Wt 156 lb 6 oz (70.9 kg)   SpO2 95%   BMI 25.24 kg/m   BP Readings from Last 3 Encounters:  01/25/20 (!) 150/90  12/31/17 140/80  12/08/17 132/84    Wt Readings from Last 3 Encounters:  01/25/20 156 lb 6 oz (70.9 kg)  12/31/17 160 lb (72.6 kg)  12/08/17  159 lb (72.1 kg)    Physical Exam Vitals reviewed.  Constitutional:      Appearance: Normal appearance. She is ill-appearing.  HENT:     Nose: Nose normal.     Mouth/Throat:     Mouth: Mucous membranes are moist.  Eyes:     General: No scleral  icterus.    Conjunctiva/sclera: Conjunctivae normal.  Cardiovascular:     Rate and Rhythm: Regular rhythm.     Heart sounds: No murmur heard.   Pulmonary:     Effort: Pulmonary effort is normal.     Breath sounds: No stridor. No wheezing, rhonchi or rales.  Abdominal:     General: Abdomen is flat.     Palpations: There is no mass.     Tenderness: There is no abdominal tenderness. There is no guarding.  Musculoskeletal:        General: Normal range of motion.     Cervical back: Neck supple.     Right lower leg: No edema.     Left lower leg: No edema.  Lymphadenopathy:     Cervical: No cervical adenopathy.  Skin:    General: Skin is warm and dry.     Coloration: Skin is not pale.  Neurological:     Mental Status: She is alert and oriented to person, place, and time.     Cranial Nerves: Cranial nerves are intact. No cranial nerve deficit.     Sensory: No sensory deficit.     Motor: Weakness present. No atrophy, abnormal muscle tone or seizure activity.     Coordination: Romberg sign positive. Coordination abnormal. Finger-Nose-Finger Test and Heel to [facility] Test normal. Impaired rapid alternating movements.     Gait: Gait abnormal and tandem walk abnormal.     Deep Tendon Reflexes: Reflexes are normal and symmetric. Reflexes normal. Babinski sign present on the right side. Babinski sign present on the left side.  Psychiatric:        Attention and Perception: She is inattentive.        Mood and Affect: Mood is anxious and depressed.        Speech: Speech is delayed and tangential.        Behavior: Behavior normal. Behavior is cooperative.        Thought Content: Thought content normal. Thought content is not paranoid or delusional. Thought content does not include homicidal or suicidal ideation.        Cognition and Memory: Cognition normal.     Lab Results  Component Value Date   WBC 8.5 01/25/2020   HGB 14.0 01/25/2020   HCT 41.9 01/25/2020   PLT 248.0 01/25/2020   GLUCOSE  107 (H) 01/25/2020   CHOL 184 12/31/2017   TRIG 185.0 (H) 12/31/2017   HDL 53.90 12/31/2017   LDLDIRECT 128.9 04/08/2011   LDLCALC 93 12/31/2017   ALT 7 01/25/2020   AST 12 01/25/2020   NA 136 01/25/2020   K 4.0 01/25/2020   CL 107 01/25/2020   CREATININE 0.97 01/25/2020   BUN 16 01/25/2020   CO2 25 01/25/2020   TSH 1.09 01/25/2020   HGBA1C 5.5 06/09/2013    No results found.  Assessment & Plan:   Raigen was seen today for hypertension.  Diagnoses and all orders for this visit:  Dysuria- Her UA is normal. -     POCT Urinalysis Dipstick (Automated)  Ataxia- She has multiple symptoms and abnormal findings on the neurologic exam.  I am concerned the B12 deficiency may be  contributing to this.  I am also concerned about pseudodementia so I recommended that she start taking duloxetine.  I have asked her to undergo an MRI of the brain with and without contrast to screen for demyelination, normal pressure hydrocephalus, CVA, vascular dementia, mass, or bleeding.  Her sed rate is normal so I do not think she has temporal arteritis. -     Vitamin B12; Future -     Vitamin B1; Future -     Folate; Future -     MR Brain W Wo Contrast; Future -     Folate -     Vitamin B1 -     Vitamin B12  Current severe episode of major depressive disorder without psychotic features without prior episode (HCC) -     DULoxetine (CYMBALTA) 30 MG capsule; Take 1 capsule (30 mg total) by mouth daily. -     Hepatic function panel; Future -     TSH; Future -     TSH -     Hepatic function panel  Essential hypertension- Her blood pressure is adequately well controlled considering her age.  I do not think her blood pressure is contributing to the headaches. -     Basic metabolic panel; Future -     Hepatic function panel; Future -     TSH; Future -     CBC with Differential/Platelet; Future -     CBC with Differential/Platelet -     TSH -     Hepatic function panel -     Basic metabolic  panel  Cerebellar ataxia in diseases classified elsewhere [facility]) -     MR Brain W Wo Contrast; Future  Chronic intractable headache, unspecified headache type -     Sedimentation rate; Future  Vitamin B12 deficiency neuropathy ([place])- I recommended that she start parenteral B12 replacement therapy.  Stage 3b chronic kidney disease- She was advised to avoid nephrotoxic agents.   I have discontinued Kirsta Narvaez's aspirin, Omega-3 Fatty Acids (FISH OIL PO), meloxicam, metoprolol tartrate, and LORazepam. I am also having her start on DULoxetine. Additionally, I am having her maintain her b complex vitamins.  Meds ordered this encounter  Medications  . DULoxetine (CYMBALTA) 30 MG capsule    Sig: Take 1 capsule (30 mg total) by mouth daily.    Dispense:  90 capsule    Refill:  0   I spent 60 minutes in preparing to see the patient by review of recent labs, imaging and procedures, obtaining and reviewing separately obtained history, communicating with the patient and family or caregiver, ordering medications, tests or procedures, and documenting clinical information in the EHR including the differential Dx, treatment, and any further evaluation and other management of 1. Dysuria 2. Ataxia 3. Current severe episode of major depressive disorder without psychotic features without prior episode ([place]) 4. Essential hypertension 5. Cerebellar ataxia in diseases classified elsewhere ([place]) 6. Chronic intractable headache, unspecified headache type 7. Vitamin B12 deficiency neuropathy ([place]) 8. Stage 3b chronic kidney disease    Follow-up: Return in about 3 weeks (around 02/15/2020).  Scarlette Calico, MD

## 2020-01-26 ENCOUNTER — Encounter: Payer: Self-pay | Admitting: Internal Medicine

## 2020-01-26 ENCOUNTER — Other Ambulatory Visit (INDEPENDENT_AMBULATORY_CARE_PROVIDER_SITE_OTHER): Payer: Medicare Other

## 2020-01-26 ENCOUNTER — Other Ambulatory Visit: Payer: Self-pay

## 2020-01-26 DIAGNOSIS — G8929 Other chronic pain: Secondary | ICD-10-CM | POA: Diagnosis not present

## 2020-01-26 DIAGNOSIS — N1832 Chronic kidney disease, stage 3b: Secondary | ICD-10-CM | POA: Insufficient documentation

## 2020-01-26 DIAGNOSIS — R519 Headache, unspecified: Secondary | ICD-10-CM | POA: Diagnosis not present

## 2020-01-26 LAB — SEDIMENTATION RATE: Sed Rate: 3 mm/hr (ref 0–30)

## 2020-01-27 ENCOUNTER — Ambulatory Visit (INDEPENDENT_AMBULATORY_CARE_PROVIDER_SITE_OTHER): Payer: Medicare Other | Admitting: *Deleted

## 2020-01-27 ENCOUNTER — Other Ambulatory Visit: Payer: Self-pay

## 2020-01-27 DIAGNOSIS — G63 Polyneuropathy in diseases classified elsewhere: Secondary | ICD-10-CM

## 2020-01-27 DIAGNOSIS — E538 Deficiency of other specified B group vitamins: Secondary | ICD-10-CM | POA: Diagnosis not present

## 2020-01-27 MED ORDER — CYANOCOBALAMIN 1000 MCG/ML IJ SOLN
1000.0000 ug | Freq: Once | INTRAMUSCULAR | Status: AC
  Administered 2020-01-27: 1000 ug via INTRAMUSCULAR

## 2020-01-27 NOTE — Progress Notes (Signed)
I have reviewed and agree.

## 2020-01-30 LAB — VITAMIN B1: Vitamin B1 (Thiamine): 11 nmol/L (ref 8–30)

## 2020-01-31 ENCOUNTER — Observation Stay (HOSPITAL_COMMUNITY)
Admission: EM | Admit: 2020-01-31 | Discharge: 2020-02-01 | Disposition: A | Discharge status: Home / Self Care | Payer: Medicare Other | Attending: Internal Medicine | Admitting: Internal Medicine

## 2020-01-31 ENCOUNTER — Encounter (HOSPITAL_COMMUNITY): Payer: Self-pay

## 2020-01-31 ENCOUNTER — Observation Stay (HOSPITAL_COMMUNITY): Payer: Medicare Other

## 2020-01-31 ENCOUNTER — Other Ambulatory Visit: Payer: Self-pay

## 2020-01-31 ENCOUNTER — Ambulatory Visit
Admission: RE | Admit: 2020-01-31 | Discharge: 2020-01-31 | Disposition: A | Discharge status: Home / Self Care | Payer: Medicare Other | Source: Ambulatory Visit | Attending: Internal Medicine | Admitting: Internal Medicine

## 2020-01-31 DIAGNOSIS — R270 Ataxia, unspecified: Secondary | ICD-10-CM

## 2020-01-31 DIAGNOSIS — I6521 Occlusion and stenosis of right carotid artery: Secondary | ICD-10-CM | POA: Diagnosis not present

## 2020-01-31 DIAGNOSIS — F329 Major depressive disorder, single episode, unspecified: Secondary | ICD-10-CM | POA: Diagnosis not present

## 2020-01-31 DIAGNOSIS — Z87891 Personal history of nicotine dependence: Secondary | ICD-10-CM | POA: Diagnosis not present

## 2020-01-31 DIAGNOSIS — G8194 Hemiplegia, unspecified affecting left nondominant side: Secondary | ICD-10-CM | POA: Diagnosis not present

## 2020-01-31 DIAGNOSIS — Z79899 Other long term (current) drug therapy: Secondary | ICD-10-CM | POA: Diagnosis not present

## 2020-01-31 DIAGNOSIS — F418 Other specified anxiety disorders: Secondary | ICD-10-CM | POA: Diagnosis present

## 2020-01-31 DIAGNOSIS — I700 Atherosclerosis of aorta: Secondary | ICD-10-CM | POA: Insufficient documentation

## 2020-01-31 DIAGNOSIS — F32A Depression, unspecified: Secondary | ICD-10-CM | POA: Diagnosis present

## 2020-01-31 DIAGNOSIS — Z20822 Contact with and (suspected) exposure to covid-19: Secondary | ICD-10-CM | POA: Insufficient documentation

## 2020-01-31 DIAGNOSIS — Z885 Allergy status to narcotic agent status: Secondary | ICD-10-CM | POA: Insufficient documentation

## 2020-01-31 DIAGNOSIS — I63531 Cerebral infarction due to unspecified occlusion or stenosis of right posterior cerebral artery: Secondary | ICD-10-CM | POA: Diagnosis not present

## 2020-01-31 DIAGNOSIS — I351 Nonrheumatic aortic (valve) insufficiency: Secondary | ICD-10-CM | POA: Insufficient documentation

## 2020-01-31 DIAGNOSIS — H539 Unspecified visual disturbance: Secondary | ICD-10-CM | POA: Diagnosis not present

## 2020-01-31 DIAGNOSIS — I639 Cerebral infarction, unspecified: Secondary | ICD-10-CM | POA: Diagnosis present

## 2020-01-31 DIAGNOSIS — N1832 Chronic kidney disease, stage 3b: Secondary | ICD-10-CM | POA: Diagnosis not present

## 2020-01-31 DIAGNOSIS — I129 Hypertensive chronic kidney disease with stage 1 through stage 4 chronic kidney disease, or unspecified chronic kidney disease: Secondary | ICD-10-CM | POA: Diagnosis not present

## 2020-01-31 DIAGNOSIS — Z7982 Long term (current) use of aspirin: Secondary | ICD-10-CM | POA: Diagnosis not present

## 2020-01-31 DIAGNOSIS — G3281 Cerebellar ataxia in diseases classified elsewhere: Secondary | ICD-10-CM

## 2020-01-31 DIAGNOSIS — F419 Anxiety disorder, unspecified: Secondary | ICD-10-CM | POA: Insufficient documentation

## 2020-01-31 DIAGNOSIS — N390 Urinary tract infection, site not specified: Secondary | ICD-10-CM | POA: Diagnosis not present

## 2020-01-31 DIAGNOSIS — I10 Essential (primary) hypertension: Secondary | ICD-10-CM | POA: Diagnosis present

## 2020-01-31 LAB — COMPREHENSIVE METABOLIC PANEL
ALT: 10 U/L (ref 0–44)
AST: 16 U/L (ref 15–41)
Albumin: 3.5 g/dL (ref 3.5–5.0)
Alkaline Phosphatase: 52 U/L (ref 38–126)
Anion gap: 8 (ref 5–15)
BUN: 11 mg/dL (ref 8–23)
CO2: 23 mmol/L (ref 22–32)
Calcium: 10 mg/dL (ref 8.9–10.3)
Chloride: 102 mmol/L (ref 98–111)
Creatinine, Ser: 1.01 mg/dL — ABNORMAL HIGH (ref 0.44–1.00)
GFR calc Af Amer: 59 mL/min — ABNORMAL LOW (ref >60)
GFR calc non Af Amer: 51 mL/min — ABNORMAL LOW (ref >60)
Glucose, Bld: 88 mg/dL (ref 70–99)
Potassium: 4.3 mmol/L (ref 3.5–5.1)
Sodium: 133 mmol/L — ABNORMAL LOW (ref 135–145)
Total Bilirubin: 0.9 mg/dL (ref 0.3–1.2)
Total Protein: 5.9 g/dL — ABNORMAL LOW (ref 6.5–8.1)

## 2020-01-31 LAB — URINALYSIS, ROUTINE W REFLEX MICROSCOPIC
Bilirubin Urine: NEGATIVE
Glucose, UA: NEGATIVE mg/dL
Hgb urine dipstick: NEGATIVE
Ketones, ur: NEGATIVE mg/dL
Leukocytes,Ua: NEGATIVE
Nitrite: POSITIVE — AB
Protein, ur: NEGATIVE mg/dL
Specific Gravity, Urine: 1.028 (ref 1.005–1.030)
pH: 5 (ref 5.0–8.0)

## 2020-01-31 LAB — DIFFERENTIAL
Abs Immature Granulocytes: 0.02 10*3/uL (ref 0.00–0.07)
Basophils Absolute: 0.1 10*3/uL (ref 0.0–0.1)
Basophils Relative: 1 %
Eosinophils Absolute: 0.5 10*3/uL (ref 0.0–0.5)
Eosinophils Relative: 6 %
Immature Granulocytes: 0 %
Lymphocytes Relative: 31 %
Lymphs Abs: 2.6 10*3/uL (ref 0.7–4.0)
Monocytes Absolute: 0.9 10*3/uL (ref 0.1–1.0)
Monocytes Relative: 11 %
Neutro Abs: 4.3 10*3/uL (ref 1.7–7.7)
Neutrophils Relative %: 51 %

## 2020-01-31 LAB — CBC
HCT: 41.5 % (ref 36.0–46.0)
Hemoglobin: 13.8 g/dL (ref 12.0–15.0)
MCH: 30 pg (ref 26.0–34.0)
MCHC: 33.3 g/dL (ref 30.0–36.0)
MCV: 90.2 fL (ref 80.0–100.0)
Platelets: 298 10*3/uL (ref 150–400)
RBC: 4.6 MIL/uL (ref 3.87–5.11)
RDW: 11.9 % (ref 11.5–15.5)
WBC: 8.4 10*3/uL (ref 4.0–10.5)
nRBC: 0 % (ref 0.0–0.2)

## 2020-01-31 LAB — PROTIME-INR
INR: 1 (ref 0.8–1.2)
Prothrombin Time: 13.1 seconds (ref 11.4–15.2)

## 2020-01-31 LAB — APTT: aPTT: 29 seconds (ref 24–36)

## 2020-01-31 MED ORDER — GADOBENATE DIMEGLUMINE 529 MG/ML IV SOLN
14.0000 mL | Freq: Once | INTRAVENOUS | Status: AC | PRN
  Administered 2020-01-31: 14 mL via INTRAVENOUS

## 2020-01-31 MED ORDER — SENNOSIDES-DOCUSATE SODIUM 8.6-50 MG PO TABS: 1.0000 | ORAL_TABLET | Freq: Every evening | ORAL | Status: DC | PRN

## 2020-01-31 MED ORDER — CIPROFLOXACIN HCL 500 MG PO TABS
500.0000 mg | ORAL_TABLET | Freq: Two times a day (BID) | ORAL | Status: DC
  Administered 2020-02-01 (×2): 500 mg via ORAL
  Filled 2020-01-31 (×2): qty 1

## 2020-01-31 MED ORDER — VITAMIN B-12 1000 MCG PO TABS
1000.0000 ug | ORAL_TABLET | Freq: Every day | ORAL | Status: DC
  Administered 2020-02-01: 1000 ug via ORAL
  Filled 2020-01-31: qty 1

## 2020-01-31 MED ORDER — ACETAMINOPHEN 160 MG/5ML PO SOLN: 650.0000 mg | ORAL | Status: DC | PRN

## 2020-01-31 MED ORDER — ACETAMINOPHEN 325 MG PO TABS: 650.0000 mg | ORAL_TABLET | ORAL | Status: DC | PRN

## 2020-01-31 MED ORDER — SODIUM CHLORIDE 0.9 % IV SOLN: INTRAVENOUS | Status: AC

## 2020-01-31 MED ORDER — ENOXAPARIN SODIUM 40 MG/0.4ML ~~LOC~~ SOLN
40.0000 mg | Freq: Every day | SUBCUTANEOUS | Status: DC
  Administered 2020-02-01: 40 mg via SUBCUTANEOUS
  Filled 2020-01-31: qty 0.4

## 2020-01-31 MED ORDER — ATORVASTATIN CALCIUM 40 MG PO TABS
40.0000 mg | ORAL_TABLET | Freq: Every day | ORAL | Status: DC
  Administered 2020-02-01: 40 mg via ORAL
  Filled 2020-01-31: qty 1

## 2020-01-31 MED ORDER — ASPIRIN 325 MG PO TABS: 325.0000 mg | ORAL_TABLET | Freq: Every day | ORAL | Status: DC

## 2020-01-31 MED ORDER — STROKE: EARLY STAGES OF RECOVERY BOOK
Freq: Once | Status: AC
  Filled 2020-01-31: qty 1

## 2020-01-31 MED ORDER — ACETAMINOPHEN 650 MG RE SUPP: 650.0000 mg | RECTAL | Status: DC | PRN

## 2020-01-31 MED ORDER — DULOXETINE HCL 30 MG PO CPEP
30.0000 mg | ORAL_CAPSULE | Freq: Every day | ORAL | Status: DC
  Administered 2020-02-01: 30 mg via ORAL
  Filled 2020-01-31: qty 1

## 2020-01-31 MED ORDER — SODIUM CHLORIDE 0.9 % IV BOLUS
500.0000 mL | Freq: Once | INTRAVENOUS | Status: AC
  Administered 2020-01-31: 500 mL via INTRAVENOUS

## 2020-01-31 NOTE — ED Triage Notes (Signed)
Pt arrives to ED w/ c/o stroke. Pt had stroke symptoms 1 week ago, L sided weakness, numbness, and confusion. Pt had MRI done today at [facility] that revealed he had a stroke. Pt sent over today for a neurology consult.

## 2020-01-31 NOTE — H&P (Addendum)
History and Physical    Jocelyn [name] MVE:[id number] DOB: 11-Feb-1932 DOA: 01/31/2020  PCP: Janith Lima, MD   Patient coming from: home  I have personally briefly reviewed patient's old medical records in [place]  Chief Complaint: Left-sided weakness, difficulty speaking, stroke on MRI  HPI: Jocelyn [name] is a 86 y.o. female with medical history significant for hypertension and major depression who presents to the emergency room with an abnormal MRI consistent with stroke.  Patient, who lives alone started having left-sided weakness and difficulty speaking on 01/21/2020. According to her granddaughter at the bedside, her symptoms started about 10 days ago with intermittent tingling on the face, intermittent left-sided weakness, intermittent confusion or disorientation as well as dysarthria.  Her daughter called EMS but they evaluated her at home and she was found to be without deficits and advised her to get an MRI.  She saw her PCP on 01/25/2020 who ordered an MRI which she got on the day of arrival on 01/31/2020.  The MRI showed acute and subacute infarcts occipital lobe, right perisplenial white matter and right caudothalamic groove.  Patient was sent to the emergency room for neurology consult.   ED Course: On arrival patient had stable vitals, BP 144/93.  Blood work-up essentially unremarkable.  Urinalysis did show positive nitrites.  The emergency room provider spoke with neurologist on-call, Dr. Leonel Ramsay who recommended admission for further work-up as patient is less than 2 weeks out.  Hospitalist consulted for admission.  Review of Systems: As per HPI otherwise 10 point review of systems negative.    Past Medical History:  Diagnosis Date  . HTN (hypertension)   . Hyperlipidemia   . Urinary incontinence     Past Surgical History:  Procedure Laterality Date  . ABDOMINAL HYSTERECTOMY    . TONSILLECTOMY       reports that she has quit smoking. She quit smokeless tobacco  use about 2 years ago. She reports current alcohol use of about 14.0 standard drinks of alcohol per week. She reports that she does not use drugs.  Allergies  Allergen Reactions  . Codeine Itching and Nausea And Vomiting    Family History  Problem Relation Age of Onset  . Diabetes Other        1st degree relative  . Hyperlipidemia Other   . Stroke Other        1st degree Female relative <50  . Cancer Neg Hx   . Early death Neg Hx   . Heart disease Neg Hx   . Hypertension Neg Hx   . Kidney disease Neg Hx      Prior to Admission medications   Medication Sig Start Date End Date Taking? Authorizing Provider  aspirin EC 325 MG tablet Take 650 mg by mouth every 6 (six) hours as needed (headache).   Yes [provider]  aspirin EC 81 MG tablet Take 81 mg by mouth daily. Swallow whole.   Yes [provider]  DULoxetine (CYMBALTA) 30 MG capsule Take 1 capsule (30 mg total) by mouth daily. 01/25/20  Yes Janith Lima, MD  Phenazopyridine HCl (AZO-STANDARD PO) Take 1 tablet by mouth 2 (two) times daily.   Yes [provider]  vitamin B-12 (CYANOCOBALAMIN) 1000 MCG tablet Take 1,000 mcg by mouth daily.   Yes [provider]  b complex vitamins tablet Take 1 tablet by mouth daily. Patient not taking: Reported on 01/25/2020    [provider]    Physical Exam: Vitals:  01/31/20 1550 01/31/20 2116  BP: 136/73 (!) 144/93  Pulse: 64 64  Resp: 18 16  Temp: 98 F (36.7 C) 98.2 F (36.8 C)  TempSrc: Oral Oral  SpO2: 97% 96%     Vitals:   01/31/20 1550 01/31/20 2116  BP: 136/73 (!) 144/93  Pulse: 64 64  Resp: 18 16  Temp: 98 F (36.7 C) 98.2 F (36.8 C)  TempSrc: Oral Oral  SpO2: 97% 96%      Constitutional: Alert and oriented x 3 . Not in any apparent distress HEENT:      Head: Normocephalic and atraumatic.         Eyes: PERLA, EOMI, Conjunctivae are normal. Sclera is non-icteric.       Mouth/Throat: Mucous membranes are moist.         Neck: Supple with no signs of meningismus. Cardiovascular: Regular rate and rhythm. No murmurs, gallops, or rubs. 2+ symmetrical distal pulses are present . No JVD. No LE edema Respiratory: Respiratory effort normal .Lungs sounds clear bilaterally. No wheezes, crackles, or rhonchi.  Gastrointestinal: Soft, non tender, and non distended with positive bowel sounds. No rebound or guarding. Genitourinary: No CVA tenderness. Musculoskeletal: Nontender with normal range of motion in all extremities. No edema, cyanosis, or erythema of extremities. Neurologic: Normal speech and language. Face is symmetric. Moving all extremities. No gross focal neurologic deficits . Skin: Skin is warm, dry.  No rash or ulcers Psychiatric: Mood and affect are normal Speech and behavior are normal   Labs on Admission: I have personally reviewed following labs and imaging studies  CBC: Recent Labs  Lab 01/25/20 1207 01/31/20 1600  WBC 8.5 8.4  NEUTROABS 5.4 4.3  HGB 14.0 13.8  HCT 41.9 41.5  MCV 91.4 90.2  PLT 248.0 741   Basic Metabolic Panel: Recent Labs  Lab 01/25/20 1207 01/31/20 1600  NA 136 133*  K 4.0 4.3  CL 107 102  CO2 25 23  GLUCOSE 107* 88  BUN 16 11  CREATININE 0.97 1.01*  CALCIUM 10.1 10.0   GFR: Estimated Creatinine Clearance: 38.1 mL/min (A) (by C-G formula based on SCr of 1.01 mg/dL (H)). Liver Function Tests: Recent Labs  Lab 01/25/20 1207 01/31/20 1600  AST 12 16  ALT 7 10  ALKPHOS 51 52  BILITOT 0.5 0.9  PROT 6.3 5.9*  ALBUMIN 4.0 3.5   No results for input(s): LIPASE, AMYLASE in the last 168 hours. No results for input(s): AMMONIA in the last 168 hours. Coagulation Profile: Recent Labs  Lab 01/31/20 1600  INR 1.0   Cardiac Enzymes: No results for input(s): CKTOTAL, CKMB, CKMBINDEX, TROPONINI in the last 168 hours. BNP (last 3 results) No results for input(s): PROBNP in the last 8760 hours. HbA1C: No results for input(s): HGBA1C in the last 72  hours. CBG: No results for input(s): GLUCAP in the last 168 hours. Lipid Profile: No results for input(s): CHOL, HDL, LDLCALC, TRIG, CHOLHDL, LDLDIRECT in the last 72 hours. Thyroid Function Tests: No results for input(s): TSH, T4TOTAL, FREET4, T3FREE, THYROIDAB in the last 72 hours. Anemia Panel: No results for input(s): VITAMINB12, FOLATE, FERRITIN, TIBC, IRON, RETICCTPCT in the last 72 hours. Urine analysis:    Component Value Date/Time   COLORURINE AMBER (A) 01/31/2020 2208   APPEARANCEUR CLEAR 01/31/2020 2208   LABSPEC 1.028 01/31/2020 2208   PHURINE 5.0 01/31/2020 [place] 01/31/2020 2208   GLUCOSEU NEGATIVE 04/08/2011 1614   HGBUR NEGATIVE 01/31/2020 [place] 01/31/2020 2208  BILIRUBINUR Negative 01/25/2020 1133   KETONESUR NEGATIVE 01/31/2020 2208   PROTEINUR NEGATIVE 01/31/2020 2208   UROBILINOGEN 0.2 01/25/2020 1133   UROBILINOGEN 0.2 04/08/2011 1614   NITRITE POSITIVE (A) 01/31/2020 2208   LEUKOCYTESUR NEGATIVE 01/31/2020 2208    Radiological Exams on Admission: MR Brain W Wo Contrast  Result Date: 01/31/2020 CLINICAL DATA:  Confusion, blurry vision EXAM: MRI HEAD WITHOUT AND WITH CONTRAST TECHNIQUE: Multiplanar, multiecho pulse sequences of the brain and surrounding structures were obtained without and with intravenous contrast. CONTRAST:  50mL MULTIHANCE GADOBENATE DIMEGLUMINE 529 MG/ML IV SOLN COMPARISON:  None. FINDINGS: Brain: There is diffusion hyperintensity with variable ADC in the right occipital lobe. Additional involvement of the right perisplenial white matter and right caudothalamic groove. Corresponding enhancement is noted. There is some intrinsic T1 hyperintensity associated with the right occipital region, which may reflect laminar necrosis or blood products, noting some susceptibility as well. There is no definite abnormal diffusion signal associated with a small focus of enhancement of the right parietal lobe along  parieto-occipital sulcus (series 14, image 88). Additional confluent areas of T2 hyperintensity in the supratentorial greater than pontine white matter are nonspecific but may reflect moderate to advanced chronic microvascular ischemic changes. There are chronic small vessel infarcts of the cerebellum, basal ganglia, and thalamus. There is no hydrocephalus or extra-axial fluid collection. Vascular: Major vessel flow voids at the skull base are preserved. Skull and upper cervical spine: Normal marrow signal is preserved. Sinuses/Orbits: Paranasal sinuses are aerated. Bilateral lens replacements. Other: Sella is unremarkable.  Mastoid air cells are clear. IMPRESSION: Abnormal signal and enhancement involving the right occipital lobe, right perisplenial white matter, and right caudothalamic groove. Favored to reflect acute (occipital) and subacute infarcts. Moderate to advanced chronic microvascular ischemic changes. These results were called by telephone at the time of interpretation on 01/31/2020 at 2:56 pm to provider Scarlette Calico , who verbally acknowledged these results. Patient is to go to the emergency department for further evaluation. Electronically Signed   By: Macy Mis M.D.   On: 01/31/2020 15:07    EKG: Independently reviewed.   Assessment/Plan Principal Problem:   Acute CVA (cerebrovascular accident) [facility]) -Patient presents with left-sided weakness and numbness with difficulty articulating words starting on 01/21/2020 outside of TPA window -MRI showing acute occipital and subacute infarcts -Stroke work-up to include carotid Dopplers, echocardiogram and continuous cardiac monitoring -Continue home aspirin.  Start statins.  Added Plavix to her regimen for 30-day overlap. -Permissive hypertension for 24 to 48 hours -PT OT and speech therapy evaluations -Neurology consult in the a.m.    UTI (urinary tract infection) -Urinalysis showing nitrites.  Patient was taking Azo as  outpatient -Cipro twice daily for 3 days -Follow cultures    Essential hypertension -Hold home antihypertensives to allow for permissive hypertension    Depression with anxiety -Continue home Cymbalta    DVT prophylaxis: Lovenox  Code Status: full code  Family Communication:  none  Disposition Plan: Back to previous home environment Consults called: none  Status:obs    Athena Masse MD [facility]     01/31/2020, 10:48 PM

## 2020-01-31 NOTE — ED Provider Notes (Signed)
[facility] EMERGENCY DEPARTMENT Provider Note   CSN: [id number] Arrival date & time: 01/31/20  1514     History Chief Complaint  Patient presents with  . Stroke Symptoms    Jocelyn [name] is a 86 y.o. female.  Pt presents to the ED today with a CVA.  Pt developed left sided weakness, visual disturbances on June 11.  The pt lives alone and called her granddaughter.  Her granddaughter called EMS and pt did not want to go to the hospital.  The pt went to her granddaughter's house over the weekend and continued to have sx.  Pt saw her pcp on 6/15.  PCP ordered a MRI which was done today.  The pt was called and told to come to the ED.        Past Medical History:  Diagnosis Date  . HTN (hypertension)   . Hyperlipidemia   . Urinary incontinence     Patient Active Problem List   Diagnosis Date Noted  . Acute CVA (cerebrovascular accident) ([place]) 01/31/2020  . Stage 3b chronic kidney disease 01/26/2020  . Ataxia 01/25/2020  . Current severe episode of major depressive disorder without psychotic features without prior episode ([place]) 01/25/2020  . Chronic intractable headache 01/25/2020  . Cerebellar ataxia in diseases classified elsewhere ([place]) 01/25/2020  . Vitamin B12 deficiency neuropathy ([place]) 01/25/2020  . Primary osteoarthritis involving multiple joints 12/08/2017  . Thoracic aorta atherosclerosis ([place]) 12/12/2014  . UTI (urinary tract infection) 09/10/2013  . Lung nodules 08/29/2013  . Depression with anxiety 06/09/2013  . Other abnormal glucose 06/09/2013  . Routine general medical examination at a health care facility 06/09/2013  . Dyslipidemia, goal LDL below 130 09/20/2010  . Essential hypertension 12/22/2008    Past Surgical History:  Procedure Laterality Date  . ABDOMINAL HYSTERECTOMY    . TONSILLECTOMY       OB History   No obstetric history on file.     Family History  Problem Relation Age of Onset  . Diabetes Other        1st degree  relative  . Hyperlipidemia Other   . Stroke Other        1st degree Female relative <50  . Cancer Neg Hx   . Early death Neg Hx   . Heart disease Neg Hx   . Hypertension Neg Hx   . Kidney disease Neg Hx     Social History   Tobacco Use  . Smoking status: Former Research scientist (life sciences)  . Smokeless tobacco: Former Systems developer    Quit date: 03/17/2017  . Tobacco comment: STATES SHE QUIT 25 YEARS AGO  Vaping Use  . Vaping Use: Never used  Substance Use Topics  . Alcohol use: Yes    Alcohol/week: 14.0 standard drinks    Types: 7 Glasses of wine, 7 Cans of beer per week  . Drug use: No    Home Medications Prior to Admission medications   Medication Sig Start Date End Date Taking? Authorizing Provider  aspirin EC 325 MG tablet Take 650 mg by mouth every 6 (six) hours as needed (headache).   Yes [provider]  aspirin EC 81 MG tablet Take 81 mg by mouth daily. Swallow whole.   Yes [provider]  DULoxetine (CYMBALTA) 30 MG capsule Take 1 capsule (30 mg total) by mouth daily. 01/25/20  Yes Janith Lima, MD  Phenazopyridine HCl (AZO-STANDARD PO) Take 1 tablet by mouth 2 (two) times daily.   Yes [provider]  vitamin B-12 (CYANOCOBALAMIN) 1000 MCG tablet Take 1,000 mcg by mouth daily.   Yes [provider]  b complex vitamins tablet Take 1 tablet by mouth daily. Patient not taking: Reported on 01/25/2020    [provider]    Allergies    Codeine  Review of Systems   Review of Systems  Neurological: Positive for dizziness.  All other systems reviewed and are negative.   Physical Exam Updated Vital Signs BP (!) 144/93 (BP Location: Right Arm)   Pulse 64   Temp 98.2 F (36.8 C) (Oral)   Resp 16   SpO2 96%   Physical Exam Vitals and nursing note reviewed.  Constitutional:      Appearance: Normal appearance.  HENT:     Head: Normocephalic and atraumatic.     Right Ear: External ear normal.     Left Ear: External ear normal.     Nose: Nose  normal.     Mouth/Throat:     Mouth: Mucous membranes are moist.     Pharynx: Oropharynx is clear.  Eyes:     Extraocular Movements: Extraocular movements intact.     Conjunctiva/sclera: Conjunctivae normal.     Pupils: Pupils are equal, round, and reactive to light.  Cardiovascular:     Rate and Rhythm: Normal rate and regular rhythm.     Pulses: Normal pulses.     Heart sounds: Normal heart sounds.  Pulmonary:     Effort: Pulmonary effort is normal.     Breath sounds: Normal breath sounds.  Abdominal:     General: Abdomen is flat. Bowel sounds are normal.     Palpations: Abdomen is soft.  Musculoskeletal:        General: Normal range of motion.     Cervical back: Normal range of motion and neck supple.  Skin:    General: Skin is warm.     Capillary Refill: Capillary refill takes less than 2 seconds.  Neurological:     Mental Status: She is alert and oriented to person, place, and time.     Comments: Left arm and leg mild weakness  Psychiatric:        Mood and Affect: Mood normal.        Behavior: Behavior normal.        Thought Content: Thought content normal.        Judgment: Judgment normal.     ED Results / Procedures / Treatments   Labs (all labs ordered are listed, but only abnormal results are displayed) Labs Reviewed  COMPREHENSIVE METABOLIC PANEL - Abnormal; Notable for the following components:      Result Value   Sodium 133 (*)    Creatinine, Ser 1.01 (*)    Total Protein 5.9 (*)    GFR calc non Af Amer 51 (*)    GFR calc Af Amer 59 (*)    All other components within normal limits  URINALYSIS, ROUTINE W REFLEX MICROSCOPIC - Abnormal; Notable for the following components:   Color, Urine AMBER (*)    Nitrite POSITIVE (*)    Bacteria, UA RARE (*)    All other components within normal limits  SARS CORONAVIRUS 2 BY RT PCR (HOSPITAL ORDER, [facility])  PROTIME-INR  APTT  CBC  DIFFERENTIAL  HEMOGLOBIN A1C  LIPID PANEL   CREATININE, SERUM  CBG MONITORING, ED    EKG EKG Interpretation  Date/Time:  Monday January 31 2020 15:52:21 EDT Ventricular Rate:  60 PR Interval:  182 QRS Duration: 66 QT Interval:  400 QTC Calculation: 400 R Axis:   -62 Text Interpretation: Normal sinus rhythm Left axis deviation Low voltage QRS Inferior infarct , age undetermined Cannot rule out Anterior infarct , age undetermined Abnormal ECG No old tracing to compare Confirmed by Isla Pence [phone]) on 01/31/2020 10:14:55 PM   Radiology MR Brain W Wo Contrast  Result Date: 01/31/2020 CLINICAL DATA:  Confusion, blurry vision EXAM: MRI HEAD WITHOUT AND WITH CONTRAST TECHNIQUE: Multiplanar, multiecho pulse sequences of the brain and surrounding structures were obtained without and with intravenous contrast. CONTRAST:  2mL MULTIHANCE GADOBENATE DIMEGLUMINE 529 MG/ML IV SOLN COMPARISON:  None. FINDINGS: Brain: There is diffusion hyperintensity with variable ADC in the right occipital lobe. Additional involvement of the right perisplenial white matter and right caudothalamic groove. Corresponding enhancement is noted. There is some intrinsic T1 hyperintensity associated with the right occipital region, which may reflect laminar necrosis or blood products, noting some susceptibility as well. There is no definite abnormal diffusion signal associated with a small focus of enhancement of the right parietal lobe along parieto-occipital sulcus (series 14, image 88). Additional confluent areas of T2 hyperintensity in the supratentorial greater than pontine white matter are nonspecific but may reflect moderate to advanced chronic microvascular ischemic changes. There are chronic small vessel infarcts of the cerebellum, basal ganglia, and thalamus. There is no hydrocephalus or extra-axial fluid collection. Vascular: Major vessel flow voids at the skull base are preserved. Skull and upper cervical spine: Normal marrow signal is preserved. Sinuses/Orbits:  Paranasal sinuses are aerated. Bilateral lens replacements. Other: Sella is unremarkable.  Mastoid air cells are clear. IMPRESSION: Abnormal signal and enhancement involving the right occipital lobe, right perisplenial white matter, and right caudothalamic groove. Favored to reflect acute (occipital) and subacute infarcts. Moderate to advanced chronic microvascular ischemic changes. These results were called by telephone at the time of interpretation on 01/31/2020 at 2:56 pm to provider Scarlette Calico , who verbally acknowledged these results. Patient is to go to the emergency department for further evaluation. Electronically Signed   By: Macy Mis M.D.   On: 01/31/2020 15:07    Procedures Procedures (including critical care time)  Medications Ordered in ED Medications  DULoxetine (CYMBALTA) DR capsule 30 mg (has no administration in time range)  ciprofloxacin (CIPRO) tablet 500 mg (has no administration in time range)  vitamin B-12 (CYANOCOBALAMIN) tablet 1,000 mcg (has no administration in time range)   stroke: mapping our early stages of recovery book (has no administration in time range)  0.9 %  sodium chloride infusion (has no administration in time range)  acetaminophen (TYLENOL) tablet 650 mg (has no administration in time range)    Or  acetaminophen (TYLENOL) 160 MG/5ML solution 650 mg (has no administration in time range)    Or  acetaminophen (TYLENOL) suppository 650 mg (has no administration in time range)  senna-docusate (Senokot-S) tablet 1 tablet (has no administration in time range)  enoxaparin (LOVENOX) injection 40 mg (has no administration in time range)  sodium chloride 0.9 % bolus 500 mL (500 mLs Intravenous New Bag/Given 01/31/20 2222)    ED Course  I have reviewed the triage vital signs and the nursing notes.  Pertinent labs & imaging results that were available during my care of the patient were reviewed by me and considered in my medical decision making (see chart  for details).    MDM Rules/Calculators/A&P  Pt d/w Dr. Leonel Ramsay (neurology) who recommended admission to the hospitalist.  Pt d/w Dr. Damita Dunnings (triad) for admission.   CRITICAL CARE Performed by: Isla Pence   Total critical care time: 30 minutes  Critical care time was exclusive of separately billable procedures and treating other patients.  Critical care was necessary to treat or prevent imminent or life-threatening deterioration.  Critical care was time spent personally by me on the following activities: development of treatment plan with patient and/or surrogate as well as nursing, discussions with consultants, evaluation of patient's response to treatment, examination of patient, obtaining history from patient or surrogate, ordering and performing treatments and interventions, ordering and review of laboratory studies, ordering and review of radiographic studies, pulse oximetry and re-evaluation of patient's condition. Final Clinical Impression(s) / ED Diagnoses Final diagnoses:  Cerebrovascular accident (CVA), unspecified mechanism ([place])    Rx / [facility] Orders ED Discharge Orders    None       Isla Pence, MD 01/31/20 2251

## 2020-02-01 ENCOUNTER — Observation Stay (HOSPITAL_COMMUNITY): Payer: Medicare Other

## 2020-02-01 ENCOUNTER — Observation Stay (HOSPITAL_BASED_OUTPATIENT_CLINIC_OR_DEPARTMENT_OTHER): Payer: Medicare Other

## 2020-02-01 DIAGNOSIS — I639 Cerebral infarction, unspecified: Secondary | ICD-10-CM

## 2020-02-01 DIAGNOSIS — I351 Nonrheumatic aortic (valve) insufficiency: Secondary | ICD-10-CM

## 2020-02-01 DIAGNOSIS — I63531 Cerebral infarction due to unspecified occlusion or stenosis of right posterior cerebral artery: Secondary | ICD-10-CM | POA: Diagnosis not present

## 2020-02-01 DIAGNOSIS — N390 Urinary tract infection, site not specified: Secondary | ICD-10-CM

## 2020-02-01 LAB — LIPID PANEL
Cholesterol: 198 mg/dL (ref 0–200)
HDL: 51 mg/dL (ref >40)
LDL Cholesterol: 123 mg/dL — ABNORMAL HIGH (ref 0–99)
Total CHOL/HDL Ratio: 3.9 RATIO
Triglycerides: 122 mg/dL (ref <150)
VLDL: 24 mg/dL (ref 0–40)

## 2020-02-01 LAB — CREATININE, SERUM
Creatinine, Ser: 0.92 mg/dL (ref 0.44–1.00)
GFR calc Af Amer: >60 mL/min (ref >60)
GFR calc non Af Amer: 57 mL/min — ABNORMAL LOW (ref >60)

## 2020-02-01 LAB — SARS CORONAVIRUS 2 BY RT PCR (HOSPITAL ORDER, PERFORMED IN ~~LOC~~ HOSPITAL LAB): SARS Coronavirus 2: NEGATIVE

## 2020-02-01 LAB — CBG MONITORING, ED: Glucose-Capillary: 93 mg/dL (ref 70–99)

## 2020-02-01 LAB — ECHOCARDIOGRAM COMPLETE

## 2020-02-01 MED ORDER — CLOPIDOGREL BISULFATE 75 MG PO TABS: 75.0000 mg | ORAL_TABLET | Freq: Every day | ORAL | 0 refills | Status: DC

## 2020-02-01 MED ORDER — ASPIRIN EC 81 MG PO TBEC
81.0000 mg | DELAYED_RELEASE_TABLET | Freq: Every day | ORAL | Status: DC
  Administered 2020-02-01: 81 mg via ORAL
  Filled 2020-02-01 (×2): qty 1

## 2020-02-01 MED ORDER — CIPROFLOXACIN HCL 500 MG PO TABS: 500.0000 mg | ORAL_TABLET | Freq: Two times a day (BID) | ORAL | 0 refills | Status: AC

## 2020-02-01 MED ORDER — CLOPIDOGREL BISULFATE 75 MG PO TABS
75.0000 mg | ORAL_TABLET | Freq: Every day | ORAL | Status: DC
  Administered 2020-02-01: 75 mg via ORAL
  Filled 2020-02-01: qty 1

## 2020-02-01 MED ORDER — ATORVASTATIN CALCIUM 40 MG PO TABS: 40.0000 mg | ORAL_TABLET | Freq: Every day | ORAL | 0 refills | Status: DC

## 2020-02-01 MED ORDER — METOCLOPRAMIDE HCL 5 MG/ML IJ SOLN
10.0000 mg | Freq: Once | INTRAMUSCULAR | Status: AC
  Administered 2020-02-01: 10 mg via INTRAVENOUS
  Filled 2020-02-01: qty 2

## 2020-02-01 MED ORDER — IOHEXOL 350 MG/ML SOLN
75.0000 mL | Freq: Once | INTRAVENOUS | Status: AC | PRN
  Administered 2020-02-01: 75 mL via INTRAVENOUS

## 2020-02-01 MED ORDER — ASPIRIN EC 81 MG PO TBEC: 81.0000 mg | DELAYED_RELEASE_TABLET | Freq: Every day | ORAL | 0 refills | Status: AC

## 2020-02-01 NOTE — ED Notes (Signed)
Lunch Tray Ordered @ 1048. °

## 2020-02-01 NOTE — Progress Notes (Signed)
EEG complete - results pending 

## 2020-02-01 NOTE — ED Notes (Signed)
Admitting PA M. Sharlet Salina notified on patient 's hypertension BP=188/87 . No new order received .

## 2020-02-01 NOTE — Progress Notes (Signed)
STROKE TEAM PROGRESS NOTE   INTERVAL HISTORY I have personally reviewed history of presenting illness with the patient and grandson is at the bedside.  She states she is doing well her confusion seems to have resolved.  She complains of mild numbness.  MRI scan shows right occipital infarct.  CT angiogram shows right P2 stenosis.  Echocardiogram was unremarkable.  EEG is negative for seizures.  LDL cholesterol is elevated 123 mg percent.  Vitals:   02/01/20 1546 02/01/20 1601 02/01/20 1626 02/01/20 1626  BP: (!) 157/89 (!) 152/78 (!) 158/77   Pulse: 66 68 66   Resp: 20 18 19    Temp:    98.7 F (37.1 C)  TempSrc:    Oral  SpO2: 95% 96% 98%     CBC:  Recent Labs  Lab 01/31/20 1600  WBC 8.4  NEUTROABS 4.3  HGB 13.8  HCT 41.5  MCV 90.2  PLT 630    Basic Metabolic Panel:  Recent Labs  Lab 01/31/20 1600 02/01/20 0034  NA 133*  --   K 4.3  --   CL 102  --   CO2 23  --   GLUCOSE 88  --   BUN 11  --   CREATININE 1.01* 0.92  CALCIUM 10.0  --    Lipid Panel:     Component Value Date/Time   CHOL 198 02/01/2020 0247   TRIG 122 02/01/2020 0247   HDL 51 02/01/2020 0247   CHOLHDL 3.9 02/01/2020 0247   VLDL 24 02/01/2020 0247   LDLCALC 123 (H) 02/01/2020 0247   HgbA1c:  Lab Results  Component Value Date   HGBA1C 5.5 06/09/2013   Urine Drug Screen: No results found for: LABOPIA, COCAINSCRNUR, LABBENZ, AMPHETMU, THCU, LABBARB  Alcohol Level No results found for: ETH  IMAGING past 24 hours CT ANGIO HEAD W OR WO CONTRAST  Result Date: 02/01/2020 CLINICAL DATA:  Recent confusion beginning approximately 1 week ago. Right-sided numbness. Abnormal MRI with acute/subacute right PCA territory infarcts. EXAM: CT ANGIOGRAPHY HEAD AND NECK TECHNIQUE: Multidetector CT imaging of the head and neck was performed using the standard protocol during bolus administration of intravenous contrast. Multiplanar CT image reconstructions and MIPs were obtained to evaluate the vascular anatomy.  Carotid stenosis measurements (when applicable) are obtained utilizing NASCET criteria, using the distal internal carotid diameter as the denominator. CONTRAST:  52mL OMNIPAQUE IOHEXOL 350 MG/ML SOLN COMPARISON:  MR head without and with contrast 01/31/2020 FINDINGS: CT HEAD FINDINGS Brain: Medial right occipital lobe infarct is again noted. Advanced white matter disease is stable. No acute hemorrhage is present. The ventricles are of normal size. No significant extraaxial fluid collection is present. The brainstem and cerebellum are within normal limits. Vascular: No significant vascular calcifications are present. Skull: Calvarium is intact. No focal lytic or blastic lesions are present. No significant extracranial soft tissue lesion is present. Sinuses: The paranasal sinuses and mastoid air cells are clear. Orbits: Bilateral lens replacements are noted. Globes and orbits are otherwise unremarkable. Review of the MIP images confirms the above findings CTA NECK FINDINGS Aortic arch: A 3 vessel arch configuration is present. Atherosclerotic changes are noted at the origins the great vessels and in the distal arch without significant stenosis or aneurysm. Right carotid system: The right common carotid artery is tortuous without focal stenosis. Atherosclerotic changes are noted at the bifurcation. Proximal right ICA is narrowed to 3 mm without significant stenosis relative to the more distal vessel. Distal right ICA is otherwise normal. Left carotid system:  The left common carotid artery is tortuous without significant stenosis. Minimal atherosclerotic changes are present at the bifurcation. Left ICA is normal. Vertebral arteries: The left vertebral artery is the dominant vessel. Both vertebral arteries originate from the subclavian arteries without significant stenosis. No significant stenosis is present in either vertebral artery in the neck. Skeleton: Mild degenerative changes of the cervical spine are most evident  with uncovertebral spurring at C4-5 and C6-7. Vertebral body heights are maintained no significant listhesis is present. No focal lytic or blastic lesions are present. Other neck: Soft tissue mass posterior to the right submandibular gland extends to the hyoid. This appears separate from the carotid sheath. Lesion demonstrates no significant growth since 2018. No other focal soft tissue lesions are present in neck. No significant adenopathy is present. Upper chest: The lung apices are clear. Thoracic inlet is within normal limits. Review of the MIP images confirms the above findings CTA HEAD FINDINGS Anterior circulation: The supraclinoid right ICA is narrowed to 1.5 mm, 50% stenosis relative to the more distal vessel. No significant stenosis is present on the left. ICA termini are within normal limits bilaterally. High-grade proximal left A1 segment stenosis is present. The anterior communicating artery is patent. Irregularity is present in the distal left M1 segment without significant stenosis. The MCA bifurcations are intact. Segmental narrowing is present in the ACA and MCA branch vessels bilaterally. High-grade stenosis is present in the right pericallosal artery. Moderate stenosis is present in the mid right frontotemporal branch. Posterior circulation: Left vertebral artery is dominant. PICA origins are visualized and normal. Vertebrobasilar junction is normal. Both posterior cerebral arteries originate from the basilar tip. High-grade proximal P2 segment stenosis is present on the right. This results in marked attenuation of distal PCA branch vessels. Superior division occlusion is present. Moderate proximal left P2 segment stenosis is present. Distal PCA branch filling is better than on the right. Marked segmental attenuation is present in the distal branches. Venous sinuses: The dural sinuses are patent. The straight sinus deep cerebral veins are intact. Cortical veins are within normal limits. No  significant vascular malformation is evident. Anatomic variants: None Review of the MIP images confirms the above findings IMPRESSION: 1. Proximal right P2 segment high-grade stenosis with marked attenuation of distal right PCA branch vessels. This corresponds to the right PCA territory acute/subacute infarcts. 2. Right P3 superior division superior division occlusion. 3. Moderate proximal left P2 segment stenosis. 4. High-grade proximal left A1 segment stenosis. 5. Moderate stenosis of the mid right frontotemporal branch, right M3. 6. Segmental narrowing of the ACA and MCA branch vessels bilaterally. 7. 50% stenosis of the supraclinoid right ICA. 8. Minimal atherosclerotic changes at the carotid bifurcations bilaterally without significant stenosis. 9. Soft tissue mass posterior to the right submandibular gland extends to the hyoid. This appears separate from the carotid sheath. There is no significant growth since 2018. 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 Paraganglioma is considered as well. MRI of the neck without and with contrast after the acute setting would be useful for further evaluation as clinically indicated. Electronically Signed   By: San Morelle M.D.   On: 02/01/2020 05:19   DG Chest 2 View  Result Date: 01/31/2020 CLINICAL DATA:  Left-sided weakness, numbness and confusion. EXAM: CHEST - 2 VIEW COMPARISON:  March 07, 2017 FINDINGS: There is no evidence of acute infiltrate, pleural effusion or pneumothorax. A stable 7 mm focus of mildly increased opacification is seen overlying the upper right lung. The  heart size and mediastinal contours are within normal limits. There is mild calcification of the aortic arch. The visualized skeletal structures are unremarkable. IMPRESSION: No active cardiopulmonary disease. Electronically Signed   By: Virgina Norfolk M.D.   On: 01/31/2020 23:23   CT ANGIO NECK W OR WO CONTRAST  Result Date: 02/01/2020 CLINICAL DATA:   Recent confusion beginning approximately 1 week ago. Right-sided numbness. Abnormal MRI with acute/subacute right PCA territory infarcts. EXAM: CT ANGIOGRAPHY HEAD AND NECK TECHNIQUE: Multidetector CT imaging of the head and neck was performed using the standard protocol during bolus administration of intravenous contrast. Multiplanar CT image reconstructions and MIPs were obtained to evaluate the vascular anatomy. Carotid stenosis measurements (when applicable) are obtained utilizing NASCET criteria, using the distal internal carotid diameter as the denominator. CONTRAST:  65mL OMNIPAQUE IOHEXOL 350 MG/ML SOLN COMPARISON:  MR head without and with contrast 01/31/2020 FINDINGS: CT HEAD FINDINGS Brain: Medial right occipital lobe infarct is again noted. Advanced white matter disease is stable. No acute hemorrhage is present. The ventricles are of normal size. No significant extraaxial fluid collection is present. The brainstem and cerebellum are within normal limits. Vascular: No significant vascular calcifications are present. Skull: Calvarium is intact. No focal lytic or blastic lesions are present. No significant extracranial soft tissue lesion is present. Sinuses: The paranasal sinuses and mastoid air cells are clear. Orbits: Bilateral lens replacements are noted. Globes and orbits are otherwise unremarkable. Review of the MIP images confirms the above findings CTA NECK FINDINGS Aortic arch: A 3 vessel arch configuration is present. Atherosclerotic changes are noted at the origins the great vessels and in the distal arch without significant stenosis or aneurysm. Right carotid system: The right common carotid artery is tortuous without focal stenosis. Atherosclerotic changes are noted at the bifurcation. Proximal right ICA is narrowed to 3 mm without significant stenosis relative to the more distal vessel. Distal right ICA is otherwise normal. Left carotid system: The left common carotid artery is tortuous  without significant stenosis. Minimal atherosclerotic changes are present at the bifurcation. Left ICA is normal. Vertebral arteries: The left vertebral artery is the dominant vessel. Both vertebral arteries originate from the subclavian arteries without significant stenosis. No significant stenosis is present in either vertebral artery in the neck. Skeleton: Mild degenerative changes of the cervical spine are most evident with uncovertebral spurring at C4-5 and C6-7. Vertebral body heights are maintained no significant listhesis is present. No focal lytic or blastic lesions are present. Other neck: Soft tissue mass posterior to the right submandibular gland extends to the hyoid. This appears separate from the carotid sheath. Lesion demonstrates no significant growth since 2018. No other focal soft tissue lesions are present in neck. No significant adenopathy is present. Upper chest: The lung apices are clear. Thoracic inlet is within normal limits. Review of the MIP images confirms the above findings CTA HEAD FINDINGS Anterior circulation: The supraclinoid right ICA is narrowed to 1.5 mm, 50% stenosis relative to the more distal vessel. No significant stenosis is present on the left. ICA termini are within normal limits bilaterally. High-grade proximal left A1 segment stenosis is present. The anterior communicating artery is patent. Irregularity is present in the distal left M1 segment without significant stenosis. The MCA bifurcations are intact. Segmental narrowing is present in the ACA and MCA branch vessels bilaterally. High-grade stenosis is present in the right pericallosal artery. Moderate stenosis is present in the mid right frontotemporal branch. Posterior circulation: Left vertebral artery is dominant. PICA origins are  visualized and normal. Vertebrobasilar junction is normal. Both posterior cerebral arteries originate from the basilar tip. High-grade proximal P2 segment stenosis is present on the right.  This results in marked attenuation of distal PCA branch vessels. Superior division occlusion is present. Moderate proximal left P2 segment stenosis is present. Distal PCA branch filling is better than on the right. Marked segmental attenuation is present in the distal branches. Venous sinuses: The dural sinuses are patent. The straight sinus deep cerebral veins are intact. Cortical veins are within normal limits. No significant vascular malformation is evident. Anatomic variants: None Review of the MIP images confirms the above findings IMPRESSION: 1. Proximal right P2 segment high-grade stenosis with marked attenuation of distal right PCA branch vessels. This corresponds to the right PCA territory acute/subacute infarcts. 2. Right P3 superior division superior division occlusion. 3. Moderate proximal left P2 segment stenosis. 4. High-grade proximal left A1 segment stenosis. 5. Moderate stenosis of the mid right frontotemporal branch, right M3. 6. Segmental narrowing of the ACA and MCA branch vessels bilaterally. 7. 50% stenosis of the supraclinoid right ICA. 8. Minimal atherosclerotic changes at the carotid bifurcations bilaterally without significant stenosis. 9. Soft tissue mass posterior to the right submandibular gland extends to the hyoid. This appears separate from the carotid sheath. There is no significant growth since 2018. 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 Paraganglioma is considered as well. MRI of the neck without and with contrast after the acute setting would be useful for further evaluation as clinically indicated. Electronically Signed   By: San Morelle M.D.   On: 02/01/2020 05:19   EEG adult  Result Date: 02/01/2020 Lora Havens, MD     02/01/2020 12:40 PM Patient Name: Jocelyn [name] MRN: [id number] Epilepsy Attending: Lora Havens Referring Physician/Provider: Dr. Kathrynn Speed Date: 02/01/2020 Duration: 24.33 mins Patient history:  86 year old female with multiple infarcts as well as intermittent paresthesias.  EEG to evaluate for seizures. Level of alertness: Awake, drowsy AEDs during EEG study: None Technical aspects: This EEG study was done with scalp electrodes positioned according to the 10-20 International system of electrode placement. Electrical activity was acquired at a sampling rate of 500Hz  and reviewed with a high frequency filter of 70Hz  and a low frequency filter of 1Hz . EEG data were recorded continuously and digitally stored. Description: The posterior dominant rhythm consists of 8 Hz activity of moderate voltage (25-35 uV) seen predominantly in posterior head regions, symmetric and reactive to eye opening and eye closing. Drowsiness was characterized by attenuation of the posterior background rhythm. Physiology photic driving was not seen during photic stimulation.  Hyperventilation was not performed.   IMPRESSION: This study is within normal limits. No seizures or epileptiform discharges were seen throughout the recording. Lora Havens   ECHOCARDIOGRAM COMPLETE  Result Date: 02/01/2020    ECHOCARDIOGRAM REPORT   Patient Name:   Jocelyn [name] Date of Exam: 02/01/2020 Medical Rec #:  [id number]   Height:       66.0 in Accession #:    [id number]  Weight:       156.4 lb Date of Birth:  02-Aug-1932  BSA:          1.801 m Patient Age:    9 years    BP:           145/78 mmHg Patient Gender: F           HR:           65 bpm. Exam  Location:  Inpatient Procedure: 2D Echo Indications:    Stroke 434.91 / I163.9  History:        Patient has no prior history of Echocardiogram examinations.                 Risk Factors:Hypertension and Dyslipidemia.  Sonographer:    Darlina Sicilian RDCS Referring Phys: [id number] [place]  1. Left ventricular ejection fraction, by estimation, is 65 to 70%. The left ventricle has normal function. The left ventricle has no regional wall motion abnormalities. Left ventricular diastolic  parameters are consistent with Grade I diastolic dysfunction (impaired relaxation). Elevated left ventricular end-diastolic pressure.  2. Right ventricular systolic function is normal. The right ventricular size is normal. Tricuspid regurgitation signal is inadequate for assessing PA pressure.  3. The mitral valve is normal in structure. Trivial mitral valve regurgitation. No evidence of mitral stenosis.  4. The aortic valve was not well visualized. Aortic valve regurgitation is mild.  5. The inferior vena cava is normal in size with greater than 50% respiratory variability, suggesting right atrial pressure of 3 mmHg. FINDINGS  Left Ventricle: Left ventricular ejection fraction, by estimation, is 65 to 70%. The left ventricle has normal function. The left ventricle has no regional wall motion abnormalities. The left ventricular internal cavity size was normal in size. There is  no left ventricular hypertrophy. Left ventricular diastolic parameters are consistent with Grade I diastolic dysfunction (impaired relaxation). Elevated left ventricular end-diastolic pressure. Right Ventricle: The right ventricular size is normal. No increase in right ventricular wall thickness. Right ventricular systolic function is normal. Tricuspid regurgitation signal is inadequate for assessing PA pressure. Left Atrium: Left atrial size was normal in size. Right Atrium: Right atrial size was normal in size. Pericardium: There is no evidence of pericardial effusion. Mitral Valve: The mitral valve is normal in structure. Normal mobility of the mitral valve leaflets. Trivial mitral valve regurgitation. No evidence of mitral valve stenosis. Tricuspid Valve: The tricuspid valve is normal in structure. Tricuspid valve regurgitation is trivial. No evidence of tricuspid stenosis. Aortic Valve: The aortic valve was not well visualized. Aortic valve regurgitation is mild. Pulmonic Valve: The pulmonic valve was normal in structure. Pulmonic valve  regurgitation is not visualized. No evidence of pulmonic stenosis. Aorta: The aortic root was not well visualized and the ascending aorta was not well visualized. Venous: The inferior vena cava is normal in size with greater than 50% respiratory variability, suggesting right atrial pressure of 3 mmHg. IAS/Shunts: No atrial level shunt detected by color flow Doppler.  LEFT VENTRICLE PLAX 2D LVIDd:         3.80 cm  Diastology LVIDs:         2.30 cm  LV e' lateral:   5.77 cm/s LV PW:         1.00 cm  LV E/e' lateral: 13.8 LV IVS:        1.10 cm  LV e' medial:    6.31 cm/s LVOT diam:     2.10 cm  LV E/e' medial:  12.6 LV SV:         52 LV SV Index:   29 LVOT Area:     3.46 cm  RIGHT VENTRICLE RV S prime:     16.10 cm/s TAPSE (M-mode): 1.8 cm LEFT ATRIUM             Index       RIGHT ATRIUM          Index  LA diam:        3.10 cm 1.72 cm/m  RA Area:     7.13 cm LA Vol (A2C):   31.7 ml 17.60 ml/m RA Volume:   9.63 ml  5.35 ml/m LA Vol (A4C):   32.7 ml 18.15 ml/m LA Biplane Vol: 33.5 ml 18.60 ml/m  AORTIC VALVE LVOT Vmax:   69.00 cm/s LVOT Vmean:  46.500 cm/s LVOT VTI:    0.151 m  AORTA Ao Root diam: 3.20 cm MITRAL VALVE MV Area (PHT): 3.06 cm     SHUNTS MV Decel Time: 248 msec     Systemic VTI:  0.15 m MV E velocity: 79.60 cm/s   Systemic Diam: 2.10 cm MV A velocity: 120.00 cm/s MV E/A ratio:  0.66 Fransico Him MD Electronically signed by Fransico Him MD Signature Date/Time: 02/01/2020/1:50:56 PM    Final     PHYSICAL EXAM Pleasant elderly Caucasian lady not in distress. . Afebrile. Head is nontraumatic. Neck is supple without bruit.    Cardiac exam no murmur or gallop. Lungs are clear to auscultation. Distal pulses are well felt. Neurological Exam ;  Awake  Alert oriented x 3. Normal speech and language.eye movements full without nystagmus.fundi were not visualized.  Dense left homonymous hemianopsia.Marland Kitchen Hearing is normal. Palatal movements are normal. Face symmetric. Tongue midline. Normal strength, tone,  reflexes and coordination. Normal sensation. Gait deferred.  ASSESSMENT/PLAN Ms. Nabeeha Badertscher is a 86 y.o. female with history of HTN, HLD presenting with waxing and waning confusion, R side numbness.   Stroke:   R PCA infarct in setting of R PCA high-grade stenosis, infarcts secondary to large vessel disease source  MRI  Acute and subacute R occipital infarcts. Moderate small vessel disease.   CTA head & neck proximal R P2 high-grade stenosis associated w/ infarct. R P3 superior division occlusion. Moderate L P2 stenosis. L A1 high-grade stenosis. Moderate mid R frontoparietal branch stenosis RM3. B narrowing ACA and MCA branches. Supraclinoid R ICA 50%. B ICA bifurcation atherosclerosis. 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 No source of embolus   LDL 123  HgbA1c pending   aspirin 81 mg daily prior to admission, now on aspirin 81 mg daily and clopidogrel 75 mg daily. Continue DAPT x 3 months then plavix alone  Therapy recommendations:  OP PT  Disposition:  D/c home  Hypertension  Stable . Permissive hypertension (OK if < 220/120) but gradually normalize in 5-7 days . Long-term BP goal normotensive  Hyperlipidemia  Home meds:  No statin  Now on lipitor 40  LDL 123, goal < 70  Continue statin at discharge  Other Stroke Risk Factors  Advanced age  Former Cigarette smoker  ETOH use,  advised to drink no more than 1 drink(s) a day  Family hx stroke (other)  Other Active Problems  Urinary incontinence  Hospital day # 0  Patient presented with some confusion and numbness as well as left-sided vision loss to the right PCA infarct.  Etiology likely atherosclerosis.  Recommend aspirin and Plavix for 3 months followed by Plavix alone.  Aggressive risk factor modification.  Follow-up as an outpatient stroke clinic.  Discussed with patient, granddaughter over the phone and grandson and Dr.[email or web address].I have spent a  total of 25 minutes with the patient reviewing hospital notes,  test results, labs and examining the patient as well as establishing an assessment and plan that was discussed personally with the patient.  >  50% of time was spent in direct patient care.      Antony Contras, MD Medical Director [facility] Pager: [phone] 02/01/2020 7:20 PM To contact Stroke Continuity provider, please refer to [email or web address] After hours, contact General Neurology

## 2020-02-01 NOTE — ED Notes (Signed)
Admitting paged to RN per his request  

## 2020-02-01 NOTE — ED Notes (Signed)
CBG 93 informed RN

## 2020-02-01 NOTE — Discharge Summary (Signed)
Physician Discharge Summary  Jocelyn [name] LKG:[id number] DOB: 02-14-1932 DOA: 01/31/2020  PCP: Janith Lima, MD  Admit date: 01/31/2020 Discharge date: 02/01/2020  Admitted From: Home Discharge disposition: Home   Code Status: Full Code  Diet Recommendation: Cardiac diet  Recommendations for Outpatient Follow-Up:   1. Follow-up with PCP as an outpatient 2. Follow-up with neurology as an outpatient  Discharge Diagnosis:   Principal Problem:   Acute CVA (cerebrovascular accident) [facility]) Active Problems:   Essential hypertension   Depression with anxiety   UTI (urinary tract infection)  History of Present Illness / Brief narrative:  Jocelyn [name] a 86 y.o.femalewith a history of hypertension, hyperlipidemia who lives alone. Patient was brought to the ED with an MRI of the brain obtained by PCP consistent with stroke. Patient was in her normal state of health until Friday06/11.  She started having intermittent tingling of the face, intermittent left-sided weakness, disorientation and dysarthria. EMS was called, she was found without deficits, was not brought to the ED and was advised to see PCP for an MRI.   615, patient was seen by PCP.  MRI was obtained.  Report received on 6/21 showing acute and subacute infarcts in the occipital lobe, right perisplenial white matter and right caudothalamic groove. Patient was sent to the emergency room for neurology consult.  In the ED, vital signs stable. Blood work unremarkable.  Urinalysis positive for nitrites. Seen by neurologist Dr. Leonel Ramsay. Admitted under hospitalist service for further evaluation management. CT angio of head and neck was obtained as well with findings as below. 1. Proximal right P2 segment high-grade stenosis with marked attenuation of distal right PCA branch vessels. This corresponds to the right PCA territory acute/subacute infarcts. 2. Right P3 superior division occlusion. 3. Moderate proximal left P2  segment stenosis. 4. High-grade proximal left A1 segment stenosis. 5. Moderate stenosis of the mid right frontotemporal branch, right M3. 6. Segmental narrowing of the ACA and MCA branch vessels bilaterally. 7. 50% stenosis of the supraclinoid right ICA. 8. Minimal atherosclerotic changes at the carotid bifurcations bilaterally without significant stenosis. 9. Soft tissue mass posterior to the right submandibular gland extends to the hyoid.   Hospital Course:  Acute stroke Multivessel intracranial artery stenosis -Patient presents with left-sided weakness and numbness with difficulty articulating words starting on 01/21/2020 outside of TPA window -MRI showing acute occipital and subacute infarcts -CT angio head and neck findings as above showing multivessel intracranial artery stenosis. -LDL 123, HDL 51, -Echo with EF 65 to 66%, grade 1 diastolic dysfunction -Neurology consult appreciated.  Prior to admission, patient was taking aspirin 81 mg daily.  Neurology recommended to continue aspirin 81 mg daily and add Plavix 75 mg daily for next 3 weeks.  After that, patient will stop aspirin and continue Plavix 75 mg daily only. -Continue Lipitor 40 mg daily. -PT eval obtained.  Recommended outpatient PT;Supervision for mobility/OOB (vestibular neuro outpatient )  UTI (urinary tract infection) -Urinalysis with clear amber urine, positive nitrite.   -Started on ciprofloxacin twice daily for 3 days.   -Urine culture sent.    History of hypertension -Not on any meds at home. - blood pressure running in 140s and 150s.  Based on the presence of significant intracranial stenosis, a higher than normal target blood pressure would be helpful to maintain cerebral perfusion.    Depression with anxiety -Continue home Cymbalta  Right submandibular gland tumor -CT angiogram of head and neck showed no soft tissue mass posterior to the right submandibular gland  extends to the hyoid. This appears  separate from the carotid sheath. There is no significant growth since 2018. 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 Paraganglioma is considered as well. MRI of the neck without and with contrast after the acute setting would be useful for further evaluation as clinically indicated. -Patient to follow-up with PCP and ENT for further evaluation management.   Mobility:  PT/OT eval obtained.  Outpatient vestibular PT recommended. Code Status:  Code Status: Full Code  Stable for discharge to home today with home health PT.  Subjective:  Seen and examined this morning.  Lying on bed.  Not in distress.  No new symptoms.  Patient is very eager to go home.  Grandson at bedside.  I also spoke with patient's daughter Jocelyn [name] over the phone.  Discharge Exam:   Vitals:   02/01/20 1316 02/01/20 1345 02/01/20 1415 02/01/20 1500  BP: (!) 145/78 (!) 142/70 131/65 (!) 153/81  Pulse: 65 61 68 62  Resp: 15 19 20 18   Temp:      TempSrc:      SpO2: 94% 93% 96% 97%    There is no height or weight on file to calculate BMI.  General exam: Appears calm and comfortable.  Skin: No rashes, lesions or ulcers. HEENT: Atraumatic, normocephalic, supple neck, no obvious bleeding Lungs: Clear to auscultation bilaterally CVS: Regular rate and rhythm, no murmur GI/Abd soft, nontender, nondistended, bowel sound present CNS: Alert, awake and oriented x3 Psychiatry: Mood appropriate Extremities: No pedal edema, no calf tenderness  Discharge Instructions:  Wound care: None     Discharge Instructions    Ambulatory referral to Neurology   Complete by: As directed    Stroke follow-up   Diet - low sodium heart healthy   Complete by: As directed    Increase activity slowly   Complete by: As directed       Follow-up Information    Janith Lima, MD Follow up.   Specialty: Internal Medicine Contact information: [address] [id number] [phone]                Allergies as of 02/01/2020      Reactions   Codeine Itching, Nausea And Vomiting      Medication List    TAKE these medications   aspirin EC 81 MG tablet Take 1 tablet (81 mg total) by mouth daily for 21 days. Swallow whole. What changed: Another medication with the same name was removed. Continue taking this medication, and follow the directions you see here.   atorvastatin 40 MG tablet Commonly known as: LIPITOR Take 1 tablet (40 mg total) by mouth daily. Start taking on: February 02, 2020   AZO-STANDARD PO Take 1 tablet by mouth 2 (two) times daily.   b complex vitamins tablet Take 1 tablet by mouth daily.   ciprofloxacin 500 MG tablet Commonly known as: CIPRO Take 1 tablet (500 mg total) by mouth 2 (two) times daily for 2 days.   clopidogrel 75 MG tablet Commonly known as: PLAVIX Take 1 tablet (75 mg total) by mouth daily. Start taking on: February 02, 2020   DULoxetine 30 MG capsule Commonly known as: CYMBALTA Take 1 capsule (30 mg total) by mouth daily.   vitamin B-12 1000 MCG tablet Commonly known as: CYANOCOBALAMIN Take 1,000 mcg by mouth daily.       Time coordinating discharge: 35 minutes  The results of significant diagnostics from this hospitalization (including imaging,  microbiology, ancillary and laboratory) are listed below for reference.    Procedures and Diagnostic Studies:   CT ANGIO HEAD W OR WO CONTRAST  Result Date: 02/01/2020 CLINICAL DATA:  Recent confusion beginning approximately 1 week ago. Right-sided numbness. Abnormal MRI with acute/subacute right PCA territory infarcts. EXAM: CT ANGIOGRAPHY HEAD AND NECK TECHNIQUE: Multidetector CT imaging of the head and neck was performed using the standard protocol during bolus administration of intravenous contrast. Multiplanar CT image reconstructions and MIPs were obtained to evaluate the vascular anatomy. Carotid stenosis measurements (when applicable) are obtained utilizing NASCET  criteria, using the distal internal carotid diameter as the denominator. CONTRAST:  66mL OMNIPAQUE IOHEXOL 350 MG/ML SOLN COMPARISON:  MR head without and with contrast 01/31/2020 FINDINGS: CT HEAD FINDINGS Brain: Medial right occipital lobe infarct is again noted. Advanced white matter disease is stable. No acute hemorrhage is present. The ventricles are of normal size. No significant extraaxial fluid collection is present. The brainstem and cerebellum are within normal limits. Vascular: No significant vascular calcifications are present. Skull: Calvarium is intact. No focal lytic or blastic lesions are present. No significant extracranial soft tissue lesion is present. Sinuses: The paranasal sinuses and mastoid air cells are clear. Orbits: Bilateral lens replacements are noted. Globes and orbits are otherwise unremarkable. Review of the MIP images confirms the above findings CTA NECK FINDINGS Aortic arch: A 3 vessel arch configuration is present. Atherosclerotic changes are noted at the origins the great vessels and in the distal arch without significant stenosis or aneurysm. Right carotid system: The right common carotid artery is tortuous without focal stenosis. Atherosclerotic changes are noted at the bifurcation. Proximal right ICA is narrowed to 3 mm without significant stenosis relative to the more distal vessel. Distal right ICA is otherwise normal. Left carotid system: The left common carotid artery is tortuous without significant stenosis. Minimal atherosclerotic changes are present at the bifurcation. Left ICA is normal. Vertebral arteries: The left vertebral artery is the dominant vessel. Both vertebral arteries originate from the subclavian arteries without significant stenosis. No significant stenosis is present in either vertebral artery in the neck. Skeleton: Mild degenerative changes of the cervical spine are most evident with uncovertebral spurring at C4-5 and C6-7. Vertebral body heights are  maintained no significant listhesis is present. No focal lytic or blastic lesions are present. Other neck: Soft tissue mass posterior to the right submandibular gland extends to the hyoid. This appears separate from the carotid sheath. Lesion demonstrates no significant growth since 2018. No other focal soft tissue lesions are present in neck. No significant adenopathy is present. Upper chest: The lung apices are clear. Thoracic inlet is within normal limits. Review of the MIP images confirms the above findings CTA HEAD FINDINGS Anterior circulation: The supraclinoid right ICA is narrowed to 1.5 mm, 50% stenosis relative to the more distal vessel. No significant stenosis is present on the left. ICA termini are within normal limits bilaterally. High-grade proximal left A1 segment stenosis is present. The anterior communicating artery is patent. Irregularity is present in the distal left M1 segment without significant stenosis. The MCA bifurcations are intact. Segmental narrowing is present in the ACA and MCA branch vessels bilaterally. High-grade stenosis is present in the right pericallosal artery. Moderate stenosis is present in the mid right frontotemporal branch. Posterior circulation: Left vertebral artery is dominant. PICA origins are visualized and normal. Vertebrobasilar junction is normal. Both posterior cerebral arteries originate from the basilar tip. High-grade proximal P2 segment stenosis is present on the right.  This results in marked attenuation of distal PCA branch vessels. Superior division occlusion is present. Moderate proximal left P2 segment stenosis is present. Distal PCA branch filling is better than on the right. Marked segmental attenuation is present in the distal branches. Venous sinuses: The dural sinuses are patent. The straight sinus deep cerebral veins are intact. Cortical veins are within normal limits. No significant vascular malformation is evident. Anatomic variants: None Review of  the MIP images confirms the above findings IMPRESSION: 1. Proximal right P2 segment high-grade stenosis with marked attenuation of distal right PCA branch vessels. This corresponds to the right PCA territory acute/subacute infarcts. 2. Right P3 superior division superior division occlusion. 3. Moderate proximal left P2 segment stenosis. 4. High-grade proximal left A1 segment stenosis. 5. Moderate stenosis of the mid right frontotemporal branch, right M3. 6. Segmental narrowing of the ACA and MCA branch vessels bilaterally. 7. 50% stenosis of the supraclinoid right ICA. 8. Minimal atherosclerotic changes at the carotid bifurcations bilaterally without significant stenosis. 9. Soft tissue mass posterior to the right submandibular gland extends to the hyoid. This appears separate from the carotid sheath. There is no significant growth since 2018. 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 Paraganglioma is considered as well. MRI of the neck without and with contrast after the acute setting would be useful for further evaluation as clinically indicated. Electronically Signed   By: San Morelle M.D.   On: 02/01/2020 05:19   DG Chest 2 View  Result Date: 01/31/2020 CLINICAL DATA:  Left-sided weakness, numbness and confusion. EXAM: CHEST - 2 VIEW COMPARISON:  March 07, 2017 FINDINGS: There is no evidence of acute infiltrate, pleural effusion or pneumothorax. A stable 7 mm focus of mildly increased opacification is seen overlying the upper right lung. The heart size and mediastinal contours are within normal limits. There is mild calcification of the aortic arch. The visualized skeletal structures are unremarkable. IMPRESSION: No active cardiopulmonary disease. Electronically Signed   By: Virgina Norfolk M.D.   On: 01/31/2020 23:23   CT ANGIO NECK W OR WO CONTRAST  Result Date: 02/01/2020 CLINICAL DATA:  Recent confusion beginning approximately 1 week ago. Right-sided numbness.  Abnormal MRI with acute/subacute right PCA territory infarcts. EXAM: CT ANGIOGRAPHY HEAD AND NECK TECHNIQUE: Multidetector CT imaging of the head and neck was performed using the standard protocol during bolus administration of intravenous contrast. Multiplanar CT image reconstructions and MIPs were obtained to evaluate the vascular anatomy. Carotid stenosis measurements (when applicable) are obtained utilizing NASCET criteria, using the distal internal carotid diameter as the denominator. CONTRAST:  51mL OMNIPAQUE IOHEXOL 350 MG/ML SOLN COMPARISON:  MR head without and with contrast 01/31/2020 FINDINGS: CT HEAD FINDINGS Brain: Medial right occipital lobe infarct is again noted. Advanced white matter disease is stable. No acute hemorrhage is present. The ventricles are of normal size. No significant extraaxial fluid collection is present. The brainstem and cerebellum are within normal limits. Vascular: No significant vascular calcifications are present. Skull: Calvarium is intact. No focal lytic or blastic lesions are present. No significant extracranial soft tissue lesion is present. Sinuses: The paranasal sinuses and mastoid air cells are clear. Orbits: Bilateral lens replacements are noted. Globes and orbits are otherwise unremarkable. Review of the MIP images confirms the above findings CTA NECK FINDINGS Aortic arch: A 3 vessel arch configuration is present. Atherosclerotic changes are noted at the origins the great vessels and in the distal arch without significant stenosis or aneurysm. Right carotid system: The right common  carotid artery is tortuous without focal stenosis. Atherosclerotic changes are noted at the bifurcation. Proximal right ICA is narrowed to 3 mm without significant stenosis relative to the more distal vessel. Distal right ICA is otherwise normal. Left carotid system: The left common carotid artery is tortuous without significant stenosis. Minimal atherosclerotic changes are present at the  bifurcation. Left ICA is normal. Vertebral arteries: The left vertebral artery is the dominant vessel. Both vertebral arteries originate from the subclavian arteries without significant stenosis. No significant stenosis is present in either vertebral artery in the neck. Skeleton: Mild degenerative changes of the cervical spine are most evident with uncovertebral spurring at C4-5 and C6-7. Vertebral body heights are maintained no significant listhesis is present. No focal lytic or blastic lesions are present. Other neck: Soft tissue mass posterior to the right submandibular gland extends to the hyoid. This appears separate from the carotid sheath. Lesion demonstrates no significant growth since 2018. No other focal soft tissue lesions are present in neck. No significant adenopathy is present. Upper chest: The lung apices are clear. Thoracic inlet is within normal limits. Review of the MIP images confirms the above findings CTA HEAD FINDINGS Anterior circulation: The supraclinoid right ICA is narrowed to 1.5 mm, 50% stenosis relative to the more distal vessel. No significant stenosis is present on the left. ICA termini are within normal limits bilaterally. High-grade proximal left A1 segment stenosis is present. The anterior communicating artery is patent. Irregularity is present in the distal left M1 segment without significant stenosis. The MCA bifurcations are intact. Segmental narrowing is present in the ACA and MCA branch vessels bilaterally. High-grade stenosis is present in the right pericallosal artery. Moderate stenosis is present in the mid right frontotemporal branch. Posterior circulation: Left vertebral artery is dominant. PICA origins are visualized and normal. Vertebrobasilar junction is normal. Both posterior cerebral arteries originate from the basilar tip. High-grade proximal P2 segment stenosis is present on the right. This results in marked attenuation of distal PCA branch vessels. Superior  division occlusion is present. Moderate proximal left P2 segment stenosis is present. Distal PCA branch filling is better than on the right. Marked segmental attenuation is present in the distal branches. Venous sinuses: The dural sinuses are patent. The straight sinus deep cerebral veins are intact. Cortical veins are within normal limits. No significant vascular malformation is evident. Anatomic variants: None Review of the MIP images confirms the above findings IMPRESSION: 1. Proximal right P2 segment high-grade stenosis with marked attenuation of distal right PCA branch vessels. This corresponds to the right PCA territory acute/subacute infarcts. 2. Right P3 superior division superior division occlusion. 3. Moderate proximal left P2 segment stenosis. 4. High-grade proximal left A1 segment stenosis. 5. Moderate stenosis of the mid right frontotemporal branch, right M3. 6. Segmental narrowing of the ACA and MCA branch vessels bilaterally. 7. 50% stenosis of the supraclinoid right ICA. 8. Minimal atherosclerotic changes at the carotid bifurcations bilaterally without significant stenosis. 9. Soft tissue mass posterior to the right submandibular gland extends to the hyoid. This appears separate from the carotid sheath. There is no significant growth since 2018. 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 Paraganglioma is considered as well. MRI of the neck without and with contrast after the acute setting would be useful for further evaluation as clinically indicated. Electronically Signed   By: San Morelle M.D.   On: 02/01/2020 05:19   MR Brain W Wo Contrast  Result Date: 01/31/2020 CLINICAL DATA:  Confusion, blurry vision  EXAM: MRI HEAD WITHOUT AND WITH CONTRAST TECHNIQUE: Multiplanar, multiecho pulse sequences of the brain and surrounding structures were obtained without and with intravenous contrast. CONTRAST:  41mL MULTIHANCE GADOBENATE DIMEGLUMINE 529 MG/ML IV SOLN  COMPARISON:  None. FINDINGS: Brain: There is diffusion hyperintensity with variable ADC in the right occipital lobe. Additional involvement of the right perisplenial white matter and right caudothalamic groove. Corresponding enhancement is noted. There is some intrinsic T1 hyperintensity associated with the right occipital region, which may reflect laminar necrosis or blood products, noting some susceptibility as well. There is no definite abnormal diffusion signal associated with a small focus of enhancement of the right parietal lobe along parieto-occipital sulcus (series 14, image 88). Additional confluent areas of T2 hyperintensity in the supratentorial greater than pontine white matter are nonspecific but may reflect moderate to advanced chronic microvascular ischemic changes. There are chronic small vessel infarcts of the cerebellum, basal ganglia, and thalamus. There is no hydrocephalus or extra-axial fluid collection. Vascular: Major vessel flow voids at the skull base are preserved. Skull and upper cervical spine: Normal marrow signal is preserved. Sinuses/Orbits: Paranasal sinuses are aerated. Bilateral lens replacements. Other: Sella is unremarkable.  Mastoid air cells are clear. IMPRESSION: Abnormal signal and enhancement involving the right occipital lobe, right perisplenial white matter, and right caudothalamic groove. Favored to reflect acute (occipital) and subacute infarcts. Moderate to advanced chronic microvascular ischemic changes. These results were called by telephone at the time of interpretation on 01/31/2020 at 2:56 pm to provider Scarlette Calico , who verbally acknowledged these results. Patient is to go to the emergency department for further evaluation. Electronically Signed   By: Macy Mis M.D.   On: 01/31/2020 15:07   EEG adult  Result Date: 02/01/2020 Lora Havens, MD     02/01/2020 12:40 PM Patient Name: Daionna Crossland MRN: [id number] Epilepsy Attending: Lora Havens  Referring Physician/Provider: Dr. Kathrynn Speed Date: 02/01/2020 Duration: 24.33 mins Patient history: 86 year old female with multiple infarcts as well as intermittent paresthesias.  EEG to evaluate for seizures. Level of alertness: Awake, drowsy AEDs during EEG study: None Technical aspects: This EEG study was done with scalp electrodes positioned according to the 10-20 International system of electrode placement. Electrical activity was acquired at a sampling rate of 500Hz  and reviewed with a high frequency filter of 70Hz  and a low frequency filter of 1Hz . EEG data were recorded continuously and digitally stored. Description: The posterior dominant rhythm consists of 8 Hz activity of moderate voltage (25-35 uV) seen predominantly in posterior head regions, symmetric and reactive to eye opening and eye closing. Drowsiness was characterized by attenuation of the posterior background rhythm. Physiology photic driving was not seen during photic stimulation.  Hyperventilation was not performed.   IMPRESSION: This study is within normal limits. No seizures or epileptiform discharges were seen throughout the recording. Priyanka Barbra Sarks     Labs:   Basic Metabolic Panel: Recent Labs  Lab 01/31/20 1600 02/01/20 0034  NA 133*  --   K 4.3  --   CL 102  --   CO2 23  --   GLUCOSE 88  --   BUN 11  --   CREATININE 1.01* 0.92  CALCIUM 10.0  --    GFR Estimated Creatinine Clearance: 41.9 mL/min (by C-G formula based on SCr of 0.92 mg/dL). Liver Function Tests: Recent Labs  Lab 01/31/20 1600  AST 16  ALT 10  ALKPHOS 52  BILITOT 0.9  PROT 5.9*  ALBUMIN 3.5   No  results for input(s): LIPASE, AMYLASE in the last 168 hours. No results for input(s): AMMONIA in the last 168 hours. Coagulation profile Recent Labs  Lab 01/31/20 1600  INR 1.0    CBC: Recent Labs  Lab 01/31/20 1600  WBC 8.4  NEUTROABS 4.3  HGB 13.8  HCT 41.5  MCV 90.2  PLT 298   Cardiac Enzymes: No results for  input(s): CKTOTAL, CKMB, CKMBINDEX, TROPONINI in the last 168 hours. BNP: Invalid input(s): POCBNP CBG: Recent Labs  Lab 02/01/20 0016  GLUCAP 93   D-Dimer No results for input(s): DDIMER in the last 72 hours. Hgb A1c No results for input(s): HGBA1C in the last 72 hours. Lipid Profile Recent Labs    02/01/20 0247  CHOL 198  HDL 51  LDLCALC 123*  TRIG 122  CHOLHDL 3.9   Thyroid function studies No results for input(s): TSH, T4TOTAL, T3FREE, THYROIDAB in the last 72 hours.  Invalid input(s): FREET3 Anemia work up No results for input(s): VITAMINB12, FOLATE, FERRITIN, TIBC, IRON, RETICCTPCT in the last 72 hours. Microbiology Recent Results (from the past 240 hour(s))  SARS Coronavirus 2 by RT PCR (hospital order, performed in [facility] hospital lab) Nasopharyngeal Nasopharyngeal Swab     Status: None   Collection Time: 01/31/20 10:25 PM   Specimen: Nasopharyngeal Swab  Result Value Ref Range Status   SARS Coronavirus 2 NEGATIVE NEGATIVE Final    Comment: (NOTE) SARS-CoV-2 target nucleic acids are NOT DETECTED.  The SARS-CoV-2 RNA is generally detectable in upper and lower respiratory specimens during the acute phase of infection. The lowest concentration of SARS-CoV-2 viral copies this assay can detect is 250 copies / mL. A negative result does not preclude SARS-CoV-2 infection and should not be used as the sole basis for treatment or other patient management decisions.  A negative result may occur with improper specimen collection / handling, submission of specimen other than nasopharyngeal swab, presence of viral mutation(s) within the areas targeted by this assay, and inadequate number of viral copies (<250 copies / mL). A negative result must be combined with clinical observations, patient history, and epidemiological information.  Fact Sheet for Patients:   StrictlyIdeas.no  Fact Sheet for Healthcare  Providers: BankingDealers.co.za  This test is not yet approved or  cleared by the [place] FDA and has been authorized for detection and/or diagnosis of SARS-CoV-2 by FDA under an Emergency Use Authorization (EUA).  This EUA will remain in effect (meaning this test can be used) for the duration of the COVID-19 declaration under Section 564(b)(1) of the Act, 21 U.S.C. section 360bbb-3(b)(1), unless the authorization is terminated or revoked sooner.  Performed at [facility], [place] [address], [place], [place] [address]     Please note: You were cared for by a hospitalist during your hospital stay. Once you are discharged, your primary care physician will handle any further medical issues. Please note that NO REFILLS for any discharge medications will be authorized once you are discharged, as it is imperative that you return to your primary care physician (or establish a relationship with a primary care physician if you do not have one) for your post hospital discharge needs so that they can reassess your need for medications and monitor your lab values.  Signed: Terrilee Croak  [facility] 02/01/2020, 3:25 PM

## 2020-02-01 NOTE — ED Notes (Signed)
Transported to CT scan

## 2020-02-01 NOTE — Evaluation (Signed)
Physical Therapy Evaluation Patient Details Name: Jocelyn [name] MRN: [id number] DOB: 12/18/1931 Today's Date: 02/01/2020   History of Present Illness  Pt is an 86 y/o female admitted secondary to confusion and visual deficits. Seen by neurology office and sent to ED after MRI revealed CVA. Neurology notes report multiple infarcts in multiple vascular territories, most consistent with cardio emboli. PMH includes HTN and vertigo.  Clinical Impression  Pt admitted secondary to problem above with deficits below. Pt requiring min guard A for ambulation this session. Mild unsteadiness, however, no overt LOB. Educated about using walker at home to increase safety. Pt also reports vertigo type symptoms and blurred vision. Feel she would benefit from follow up with outpatient vestibular therapy. Will continue to follow acutely to maximize functional mobility independence and safety.     Follow Up Recommendations Outpatient PT;Supervision for mobility/OOB (vestibular neuro outpatient )    Equipment Recommendations  None recommended by PT    Recommendations for Other Services       Precautions / Restrictions Precautions Precautions: Fall Restrictions Weight Bearing Restrictions: No      Mobility  Bed Mobility Overal bed mobility: Needs Assistance Bed Mobility: Supine to Sit;Sit to Supine     Supine to sit: Supervision Sit to supine: Supervision   General bed mobility comments: Supervision for safety.   Transfers Overall transfer level: Needs assistance Equipment used: 1 person hand held assist Transfers: Sit to/from Stand Sit to Stand: Min guard         General transfer comment: Min guard for safety to stand.   Ambulation/Gait Ambulation/Gait assistance: Min guard Gait Distance (Feet): 100 Feet Assistive device: 1 person hand held assist Gait Pattern/deviations: Step-through pattern;Decreased stride length Gait velocity: Decreased   General Gait Details: Pt holding to PT  arm. Slow, cautious gait. Mild unsteadiness noted. Pt reports feeling mildly dizzy, but reports she has vertigo at baseline. Also reports vision is slightly blurry. Educated about using walker at home to increase safety.   Stairs            Wheelchair Mobility    Modified Rankin (Stroke Patients Only) Modified Rankin (Stroke Patients Only) Pre-Morbid Rankin Score: No symptoms Modified Rankin: Moderately severe disability     Balance Overall balance assessment: Mild deficits observed, not formally tested                                           Pertinent Vitals/Pain Pain Assessment: No/denies pain    Home Living Family/patient expects to be discharged to:: Private residence Living Arrangements: Alone Available Help at Discharge: Family Type of Home: House Home Access: Stairs to enter Entrance Stairs-Rails: Left Entrance Stairs-Number of Steps: 3 Home Layout: One level Home Equipment: Walker - 2 wheels;Walker - 4 wheels;Shower seat;Cane - single point;Grab bars - toilet      Prior Function Level of Independence: Independent with assistive device(s)         Comments: Uses rollator for ambulation      Hand Dominance        Extremity/Trunk Assessment   Upper Extremity Assessment Upper Extremity Assessment: Defer to OT evaluation    Lower Extremity Assessment Lower Extremity Assessment: Generalized weakness    Cervical / Trunk Assessment Cervical / Trunk Assessment: Kyphotic  Communication   Communication: No difficulties  Cognition Arousal/Alertness: Awake/alert Behavior During Therapy: WFL for tasks assessed/performed Overall Cognitive Status: Within Functional Limits  for tasks assessed                                        General Comments General comments (skin integrity, edema, etc.): Pt's grandson present     Exercises     Assessment/Plan    PT Assessment Patient needs continued PT services  PT Problem  List Decreased strength;Decreased balance;Decreased mobility;Decreased knowledge of precautions;Decreased knowledge of use of DME       PT Treatment Interventions DME instruction;Gait training;Functional mobility training;Therapeutic activities;Balance training;Therapeutic exercise;Patient/family education;Neuromuscular re-education;Stair training    PT Goals (Current goals can be found in the Care Plan section)  Acute Rehab PT Goals Patient Stated Goal: to go home PT Goal Formulation: With patient Time For Goal Achievement: 02/15/20 Potential to Achieve Goals: Good    Frequency Min 5X/week   Barriers to discharge        Co-evaluation               AM-PAC PT "6 Clicks" Mobility  Outcome Measure Help needed turning from your back to your side while in a flat bed without using bedrails?: None Help needed moving from lying on your back to sitting on the side of a flat bed without using bedrails?: None Help needed moving to and from a bed to a chair (including a wheelchair)?: A Little Help needed standing up from a chair using your arms (e.g., wheelchair or bedside chair)?: A Little Help needed to walk in hospital room?: A Little Help needed climbing 3-5 steps with a railing? : A Little 6 Click Score: 20    End of Session   Activity Tolerance: Patient tolerated treatment well Patient left: in bed;with call bell/phone within reach;with family/visitor present (on stretcher in ED ) Nurse Communication: Mobility status PT Visit Diagnosis: Dizziness and giddiness (R42);Unsteadiness on feet (R26.81);Muscle weakness (generalized) (M62.81)    Time: 1209-1229 PT Time Calculation (min) (ACUTE ONLY): 20 min   Charges:   PT Evaluation $PT Eval Low Complexity: 1 Low          Lou Miner, DPT  Acute Rehabilitation Services  Pager: [phone] Office: [phone]   Rudean Hitt 02/01/2020, 2:23 PM

## 2020-02-01 NOTE — Procedures (Signed)
Patient Name: Jocelyn [name]  MRN: [id number]  Epilepsy Attending: Lora Havens  Referring Physician/Provider: Dr. Kathrynn Speed Date: 02/01/2020 Duration: 24.33 mins  Patient history: 86 year old female with multiple infarcts as well as intermittent paresthesias.  EEG to evaluate for seizures.  Level of alertness: Awake, drowsy  AEDs during EEG study: None  Technical aspects: This EEG study was done with scalp electrodes positioned according to the 10-20 International system of electrode placement. Electrical activity was acquired at a sampling rate of 500Hz  and reviewed with a high frequency filter of 70Hz  and a low frequency filter of 1Hz . EEG data were recorded continuously and digitally stored.   Description: The posterior dominant rhythm consists of 8 Hz activity of moderate voltage (25-35 uV) seen predominantly in posterior head regions, symmetric and reactive to eye opening and eye closing. Drowsiness was characterized by attenuation of the posterior background rhythm. Physiology photic driving was not seen during photic stimulation.  Hyperventilation was not performed.     IMPRESSION: This study is within normal limits. No seizures or epileptiform discharges were seen throughout the recording.  [name]

## 2020-02-01 NOTE — ED Notes (Signed)
EEG at bedside.

## 2020-02-01 NOTE — Consult Note (Signed)
Neurology Consultation Reason for Consult: Stroke Referring Physician: Aileen Pilot  CC: Confusion  History is obtained from: Patient, daughter  HPI: Jocelyn [name] is a 86 y.o. female with a history of hypertension, hyperlipidemia who was in her normal state of health until Friday 06/11. At that time she began getting confused. She also describes some numbness affecting her right side at that time as well.  Her daughter describes that since that time, she has been persistently confused.  There has been some waxing/waning and that she seems to get her days and nights confused, but not true episodic abrupt changes.  She describes intermittent paresthesias.  She gets a pins and needle sensation on her face that spreads to her hand and last for about 5 minutes.   LKW: Friday 6/11 tpa given?: no, out of window   ROS: A 14 point ROS was performed and is negative except as noted in the HPI.   Past Medical History:  Diagnosis Date  . HTN (hypertension)   . Hyperlipidemia   . Urinary incontinence      Family History  Problem Relation Age of Onset  . Diabetes Other        1st degree relative  . Hyperlipidemia Other   . Stroke Other        1st degree Female relative <50  . Cancer Neg Hx   . Early death Neg Hx   . Heart disease Neg Hx   . Hypertension Neg Hx   . Kidney disease Neg Hx      Social History:  reports that she has quit smoking. She quit smokeless tobacco use about 2 years ago. She reports current alcohol use of about 14.0 standard drinks of alcohol per week. She reports that she does not use drugs.   Exam: Current vital signs: BP (!) 188/87 (BP Location: Left Arm)   Pulse (!) 58   Temp 98.2 F (36.8 C) (Oral)   Resp 13   SpO2 96%  Vital signs in last 24 hours: Temp:  [98 F (36.7 C)-98.2 F (36.8 C)] 98.2 F (36.8 C) (06/21 2116) Pulse Rate:  [58-64] 58 (06/22 0147) Resp:  [13-21] 13 (06/22 0147) BP: (136-188)/(73-93) 188/87 (06/22 0147) SpO2:  [96 %-97 %] 96 %  (06/22 0147)   Physical Exam  Constitutional: Appears well-developed and well-nourished.  Psych: Affect appropriate to situation Eyes: No scleral injection HENT: No OP obstrucion MSK: no joint deformities.  Cardiovascular: Normal rate and regular rhythm.  Respiratory: Effort normal, non-labored breathing GI: Soft.  No distension. There is no tenderness.  Skin: WDI  Neuro: Mental Status: Patient is awake, alert, oriented to person, place, month, year, and situation. No signs of aphasia or neglect Cranial Nerves: II: She is able to count fingers in all four visual fields. Pupils are equal, round, and reactive to light.   III,IV, VI: EOMI without ptosis or diploplia.  V: Facial sensation is symmetric to temperature VII: Facial movement is symmetric.  VIII: hearing is intact to voice X: Uvula elevates symmetrically XI: Shoulder shrug is symmetric. XII: tongue is midline without atrophy or fasciculations.  Motor: Tone is normal. Bulk is normal. 5/5 strength was present in all four extremities.  Sensory: Sensation is symmetric to light touch and temperature in the arms and legs. Cerebellar: FNF and HKS are intact bilaterally   I have reviewed labs in epic and the results pertinent to this consultation are: Creatinine 1.0  I have reviewed the images obtained: MRI brain with/without contrast-multiple diffusion  positive lesions consistent with infarcts of different ages.  Impression: 86 year old female with multiple infarcts in multiple vascular territories, most consistent with cardio emboli.  She will need an embolic work-up as well as PT/OT evaluations.  The intermittent paresthesias are slightly unusual, and I do wonder if she could be having small sensory seizures.  Recommendations: - HgbA1c, fasting lipid panel - Frequent neuro checks - Echocardiogram -CTA head neck - Prophylactic therapy-Antiplatelet med: Aspirin - dose 325mg  PO or 300mg  PR - Risk factor modification -  Telemetry monitoring - PT consult, OT consult, Speech consult - Stroke team to follow    Roland Rack, MD [facility] [phone]  If 7pm- 7am, please page neurology on call as listed in [place].

## 2020-02-01 NOTE — ED Notes (Signed)
Pt is NSR on monitor 

## 2020-02-01 NOTE — ED Notes (Signed)
Micro to add on urine culture

## 2020-02-01 NOTE — CV Procedure (Signed)
Echocardiogram not complete due to other testing.  Darlina Sicilian [facility]  02/01/20 10:09 am

## 2020-02-02 ENCOUNTER — Telehealth: Payer: Self-pay | Admitting: Internal Medicine

## 2020-02-02 LAB — URINE CULTURE: Culture: NO GROWTH

## 2020-02-02 LAB — HEMOGLOBIN A1C
Hgb A1c MFr Bld: 4.7 % — ABNORMAL LOW (ref 4.8–5.6)
Mean Plasma Glucose: 88 mg/dL

## 2020-02-02 NOTE — Telephone Encounter (Signed)
New Message:   Pt's grand daughter would like to know if the pt should be seen sooner that 07/07 due to her having a stroke. This appt was made off of the recommendations from the hosp of when the pt should follow up. Please advise.

## 2020-02-04 NOTE — Telephone Encounter (Signed)
Called pt/grand daughter spoke w/[employer] manager) completed TCM call below.Johny Chess  Transition Care Management Follow-up Telephone Call   Date discharged? 02/01/20   How have you been since you were released from the hospital? Eliezer Lofts states she is doing ok. Still having problems seeing out of (L) eye. She is also been c/o being weak., and wanted to see if she can moved appt up from July 7th   Do you understand why you were in the hospital? YES   Do you understand the discharge instructions? YES   Where were you discharged to? Home   Items Reviewed:  Medications reviewed: YES. She states there no changes on her medications  Allergies reviewed: YES, no chnages  Dietary changes reviewed: NO  Referrals reviewed: YES, she states will be seeing neurologist 7/21, she will see the PA   Functional Questionnaire:   Activities of Daily Living (ADLs):   She states she are independent in the following: bathing and hygiene, feeding, continence, grooming and toileting States they require assistance with the following: ambulation she uses a walker   Any transportation issues/concerns?: NO   Any patient concerns? NO   Confirmed importance and date/time of follow-up visits scheduled YES, appt 02/08/20  Provider Appointment booked with Dr. Ronnald Ramp  Confirmed with patient if condition begins to worsen call PCP or go to the ER.  Patient was given the office number and encouraged to call back with question or concerns.  : YES

## 2020-02-08 ENCOUNTER — Other Ambulatory Visit: Payer: Self-pay

## 2020-02-08 ENCOUNTER — Ambulatory Visit (INDEPENDENT_AMBULATORY_CARE_PROVIDER_SITE_OTHER): Payer: Medicare Other | Admitting: Internal Medicine

## 2020-02-08 ENCOUNTER — Encounter: Payer: Self-pay | Admitting: Internal Medicine

## 2020-02-08 VITALS — BP 124/66 | HR 84 | Temp 98.3°F | Ht 66.0 in | Wt 156.0 lb

## 2020-02-08 DIAGNOSIS — G63 Polyneuropathy in diseases classified elsewhere: Secondary | ICD-10-CM

## 2020-02-08 DIAGNOSIS — I10 Essential (primary) hypertension: Secondary | ICD-10-CM

## 2020-02-08 DIAGNOSIS — I69393 Ataxia following cerebral infarction: Secondary | ICD-10-CM

## 2020-02-08 DIAGNOSIS — E538 Deficiency of other specified B group vitamins: Secondary | ICD-10-CM | POA: Diagnosis not present

## 2020-02-08 DIAGNOSIS — I63531 Cerebral infarction due to unspecified occlusion or stenosis of right posterior cerebral artery: Secondary | ICD-10-CM

## 2020-02-08 MED ORDER — CYANOCOBALAMIN 1000 MCG/ML IJ SOLN
1000.0000 ug | Freq: Once | INTRAMUSCULAR | Status: AC
  Administered 2020-02-08: 1000 ug via INTRAMUSCULAR

## 2020-02-08 NOTE — Progress Notes (Signed)
Subjective:  Patient ID: Jocelyn [name], female    DOB: 1932/07/28  Age: 86 y.o. MRN: [id number]  CC: Hypertension  This visit occurred during the SARS-CoV-2 public health emergency.  Safety protocols were in place, including screening questions prior to the visit, additional usage of staff PPE, and extensive cleaning of exam room while observing appropriate contact time as indicated for disinfecting solutions.    HPI Jocelyn [name] presents for f/up - She was seen about 2 weeks ago with the complaint of ataxia.  She was found to have B12 deficiency.  Subsequently she was admitted after an MRI showed infarcts the right occipital lobe, right perisplenial white matter, and right caudothalamic groove.  She was found to have associated vascular disease.  She is being treated with dual antiplatelet therapy and a statin.  She is living with family and reports that she is making a modest recovery.  She still feels mildly ataxic but the localized areas of weakness have resolved.  She wants to start doing some physical therapy.   1. Follow-up with PCP as an outpatient 2. Follow-up with neurology as an outpatient  Discharge Diagnosis:   Principal Problem:   Acute CVA (cerebrovascular accident) [facility]) Active Problems:   Essential hypertension   Depression with anxiety   UTI (urinary tract infection)   Outpatient Medications Prior to Visit  Medication Sig Dispense Refill  . aspirin EC 81 MG tablet Take 1 tablet (81 mg total) by mouth daily for 21 days. Swallow whole. 21 tablet 0  . atorvastatin (LIPITOR) 40 MG tablet Take 1 tablet (40 mg total) by mouth daily. 90 tablet 0  . clopidogrel (PLAVIX) 75 MG tablet Take 1 tablet (75 mg total) by mouth daily. 90 tablet 0  . DULoxetine (CYMBALTA) 30 MG capsule Take 1 capsule (30 mg total) by mouth daily. 90 capsule 0  . vitamin B-12 (CYANOCOBALAMIN) 1000 MCG tablet Take 1,000 mcg by mouth daily.    Marland Kitchen b complex vitamins tablet Take 1 tablet by mouth daily.  (Patient not taking: Reported on 01/25/2020)    . Phenazopyridine HCl (AZO-STANDARD PO) Take 1 tablet by mouth 2 (two) times daily.     No facility-administered medications prior to visit.    ROS Review of Systems  Constitutional: Positive for fatigue. Negative for appetite change, diaphoresis and unexpected weight change.  HENT: Negative.   Eyes: Negative for visual disturbance.  Respiratory: Negative for cough, chest tightness, shortness of breath and wheezing.   Cardiovascular: Negative for chest pain, palpitations and leg swelling.  Gastrointestinal: Negative for abdominal pain, constipation, diarrhea, nausea and vomiting.  Endocrine: Negative.   Genitourinary: Negative.  Negative for difficulty urinating and dysuria.  Musculoskeletal: Positive for gait problem. Negative for arthralgias, back pain and neck pain.  Skin: Negative.   Neurological: Positive for dizziness and weakness. Negative for light-headedness, numbness and headaches.  Hematological: Negative for adenopathy. Does not bruise/bleed easily.  Psychiatric/Behavioral: Positive for dysphoric mood. Negative for self-injury, sleep disturbance and suicidal ideas. The patient is nervous/anxious.     Objective:  BP 124/66 (BP Location: Left Arm, Patient Position: Sitting, Cuff Size: Normal)   Pulse 84   Temp 98.3 F (36.8 C) (Oral)   Ht 5\' 6"  (1.676 m)   Wt 156 lb (70.8 kg)   SpO2 96%   BMI 25.18 kg/m   BP Readings from Last 3 Encounters:  02/08/20 124/66  02/01/20 (!) 158/77  01/25/20 (!) 150/90    Wt Readings from Last 3 Encounters:  02/08/20 156  lb (70.8 kg)  01/25/20 156 lb 6 oz (70.9 kg)  12/31/17 160 lb (72.6 kg)    Physical Exam Vitals reviewed.  Constitutional:      Appearance: Normal appearance.  HENT:     Nose: Nose normal.     Mouth/Throat:     Mouth: Mucous membranes are moist.  Eyes:     General: No scleral icterus.    Conjunctiva/sclera: Conjunctivae normal.  Cardiovascular:     Rate and  Rhythm: Normal rate and regular rhythm.     Heart sounds: No murmur heard.   Pulmonary:     Effort: Pulmonary effort is normal.     Breath sounds: No stridor. No wheezing, rhonchi or rales.  Abdominal:     General: Abdomen is flat. There is no distension.     Palpations: Abdomen is soft. There is no hepatomegaly, splenomegaly or mass.     Tenderness: There is no abdominal tenderness.  Musculoskeletal:        General: Normal range of motion.     Cervical back: Neck supple.     Right lower leg: No edema.     Left lower leg: No edema.  Lymphadenopathy:     Cervical: No cervical adenopathy.  Skin:    General: Skin is warm and dry.  Neurological:     Mental Status: She is alert and oriented to person, place, and time.     Cranial Nerves: Cranial nerves are intact. No dysarthria or facial asymmetry.     Sensory: Sensation is intact.     Motor: Weakness present.     Coordination: Romberg sign positive. Coordination abnormal.     Gait: Gait is intact.     Deep Tendon Reflexes:     Reflex Scores:      Tricep reflexes are 0 on the right side and 0 on the left side.      Bicep reflexes are 1+ on the right side and 1+ on the left side.      Brachioradialis reflexes are 1+ on the right side and 1+ on the left side.      Patellar reflexes are 2+ on the right side and 1+ on the left side.      Achilles reflexes are 0 on the right side and 0 on the left side.    Comments: +weakness in the LLE     Lab Results  Component Value Date   WBC 8.4 01/31/2020   HGB 13.8 01/31/2020   HCT 41.5 01/31/2020   PLT 298 01/31/2020   GLUCOSE 88 01/31/2020   CHOL 198 02/01/2020   TRIG 122 02/01/2020   HDL 51 02/01/2020   LDLDIRECT 128.9 04/08/2011   LDLCALC 123 (H) 02/01/2020   ALT 10 01/31/2020   AST 16 01/31/2020   NA 133 (L) 01/31/2020   K 4.3 01/31/2020   CL 102 01/31/2020   CREATININE 0.92 02/01/2020   BUN 11 01/31/2020   CO2 23 01/31/2020   TSH 1.09 01/25/2020   INR 1.0 01/31/2020    HGBA1C 4.7 (L) 02/01/2020    CT ANGIO HEAD W OR WO CONTRAST  Result Date: 02/01/2020 CLINICAL DATA:  Recent confusion beginning approximately 1 week ago. Right-sided numbness. Abnormal MRI with acute/subacute right PCA territory infarcts. EXAM: CT ANGIOGRAPHY HEAD AND NECK TECHNIQUE: Multidetector CT imaging of the head and neck was performed using the standard protocol during bolus administration of intravenous contrast. Multiplanar CT image reconstructions and MIPs were obtained to evaluate the vascular anatomy. Carotid stenosis measurements (  when applicable) are obtained utilizing NASCET criteria, using the distal internal carotid diameter as the denominator. CONTRAST:  59mL OMNIPAQUE IOHEXOL 350 MG/ML SOLN COMPARISON:  MR head without and with contrast 01/31/2020 FINDINGS: CT HEAD FINDINGS Brain: Medial right occipital lobe infarct is again noted. Advanced white matter disease is stable. No acute hemorrhage is present. The ventricles are of normal size. No significant extraaxial fluid collection is present. The brainstem and cerebellum are within normal limits. Vascular: No significant vascular calcifications are present. Skull: Calvarium is intact. No focal lytic or blastic lesions are present. No significant extracranial soft tissue lesion is present. Sinuses: The paranasal sinuses and mastoid air cells are clear. Orbits: Bilateral lens replacements are noted. Globes and orbits are otherwise unremarkable. Review of the MIP images confirms the above findings CTA NECK FINDINGS Aortic arch: A 3 vessel arch configuration is present. Atherosclerotic changes are noted at the origins the great vessels and in the distal arch without significant stenosis or aneurysm. Right carotid system: The right common carotid artery is tortuous without focal stenosis. Atherosclerotic changes are noted at the bifurcation. Proximal right ICA is narrowed to 3 mm without significant stenosis relative to the more distal vessel.  Distal right ICA is otherwise normal. Left carotid system: The left common carotid artery is tortuous without significant stenosis. Minimal atherosclerotic changes are present at the bifurcation. Left ICA is normal. Vertebral arteries: The left vertebral artery is the dominant vessel. Both vertebral arteries originate from the subclavian arteries without significant stenosis. No significant stenosis is present in either vertebral artery in the neck. Skeleton: Mild degenerative changes of the cervical spine are most evident with uncovertebral spurring at C4-5 and C6-7. Vertebral body heights are maintained no significant listhesis is present. No focal lytic or blastic lesions are present. Other neck: Soft tissue mass posterior to the right submandibular gland extends to the hyoid. This appears separate from the carotid sheath. Lesion demonstrates no significant growth since 2018. No other focal soft tissue lesions are present in neck. No significant adenopathy is present. Upper chest: The lung apices are clear. Thoracic inlet is within normal limits. Review of the MIP images confirms the above findings CTA HEAD FINDINGS Anterior circulation: The supraclinoid right ICA is narrowed to 1.5 mm, 50% stenosis relative to the more distal vessel. No significant stenosis is present on the left. ICA termini are within normal limits bilaterally. High-grade proximal left A1 segment stenosis is present. The anterior communicating artery is patent. Irregularity is present in the distal left M1 segment without significant stenosis. The MCA bifurcations are intact. Segmental narrowing is present in the ACA and MCA branch vessels bilaterally. High-grade stenosis is present in the right pericallosal artery. Moderate stenosis is present in the mid right frontotemporal branch. Posterior circulation: Left vertebral artery is dominant. PICA origins are visualized and normal. Vertebrobasilar junction is normal. Both posterior cerebral  arteries originate from the basilar tip. High-grade proximal P2 segment stenosis is present on the right. This results in marked attenuation of distal PCA branch vessels. Superior division occlusion is present. Moderate proximal left P2 segment stenosis is present. Distal PCA branch filling is better than on the right. Marked segmental attenuation is present in the distal branches. Venous sinuses: The dural sinuses are patent. The straight sinus deep cerebral veins are intact. Cortical veins are within normal limits. No significant vascular malformation is evident. Anatomic variants: None Review of the MIP images confirms the above findings IMPRESSION: 1. Proximal right P2 segment high-grade stenosis with marked attenuation  of distal right PCA branch vessels. This corresponds to the right PCA territory acute/subacute infarcts. 2. Right P3 superior division superior division occlusion. 3. Moderate proximal left P2 segment stenosis. 4. High-grade proximal left A1 segment stenosis. 5. Moderate stenosis of the mid right frontotemporal branch, right M3. 6. Segmental narrowing of the ACA and MCA branch vessels bilaterally. 7. 50% stenosis of the supraclinoid right ICA. 8. Minimal atherosclerotic changes at the carotid bifurcations bilaterally without significant stenosis. 9. Soft tissue mass posterior to the right submandibular gland extends to the hyoid. This appears separate from the carotid sheath. There is no significant growth since 2018. 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 Paraganglioma is considered as well. MRI of the neck without and with contrast after the acute setting would be useful for further evaluation as clinically indicated. Electronically Signed   By: San Morelle M.D.   On: 02/01/2020 05:19   DG Chest 2 View  Result Date: 01/31/2020 CLINICAL DATA:  Left-sided weakness, numbness and confusion. EXAM: CHEST - 2 VIEW COMPARISON:  March 07, 2017 FINDINGS: There is  no evidence of acute infiltrate, pleural effusion or pneumothorax. A stable 7 mm focus of mildly increased opacification is seen overlying the upper right lung. The heart size and mediastinal contours are within normal limits. There is mild calcification of the aortic arch. The visualized skeletal structures are unremarkable. IMPRESSION: No active cardiopulmonary disease. Electronically Signed   By: Virgina Norfolk M.D.   On: 01/31/2020 23:23   CT ANGIO NECK W OR WO CONTRAST  Result Date: 02/01/2020 CLINICAL DATA:  Recent confusion beginning approximately 1 week ago. Right-sided numbness. Abnormal MRI with acute/subacute right PCA territory infarcts. EXAM: CT ANGIOGRAPHY HEAD AND NECK TECHNIQUE: Multidetector CT imaging of the head and neck was performed using the standard protocol during bolus administration of intravenous contrast. Multiplanar CT image reconstructions and MIPs were obtained to evaluate the vascular anatomy. Carotid stenosis measurements (when applicable) are obtained utilizing NASCET criteria, using the distal internal carotid diameter as the denominator. CONTRAST:  64mL OMNIPAQUE IOHEXOL 350 MG/ML SOLN COMPARISON:  MR head without and with contrast 01/31/2020 FINDINGS: CT HEAD FINDINGS Brain: Medial right occipital lobe infarct is again noted. Advanced white matter disease is stable. No acute hemorrhage is present. The ventricles are of normal size. No significant extraaxial fluid collection is present. The brainstem and cerebellum are within normal limits. Vascular: No significant vascular calcifications are present. Skull: Calvarium is intact. No focal lytic or blastic lesions are present. No significant extracranial soft tissue lesion is present. Sinuses: The paranasal sinuses and mastoid air cells are clear. Orbits: Bilateral lens replacements are noted. Globes and orbits are otherwise unremarkable. Review of the MIP images confirms the above findings CTA NECK FINDINGS Aortic arch: A 3  vessel arch configuration is present. Atherosclerotic changes are noted at the origins the great vessels and in the distal arch without significant stenosis or aneurysm. Right carotid system: The right common carotid artery is tortuous without focal stenosis. Atherosclerotic changes are noted at the bifurcation. Proximal right ICA is narrowed to 3 mm without significant stenosis relative to the more distal vessel. Distal right ICA is otherwise normal. Left carotid system: The left common carotid artery is tortuous without significant stenosis. Minimal atherosclerotic changes are present at the bifurcation. Left ICA is normal. Vertebral arteries: The left vertebral artery is the dominant vessel. Both vertebral arteries originate from the subclavian arteries without significant stenosis. No significant stenosis is present in either vertebral artery  in the neck. Skeleton: Mild degenerative changes of the cervical spine are most evident with uncovertebral spurring at C4-5 and C6-7. Vertebral body heights are maintained no significant listhesis is present. No focal lytic or blastic lesions are present. Other neck: Soft tissue mass posterior to the right submandibular gland extends to the hyoid. This appears separate from the carotid sheath. Lesion demonstrates no significant growth since 2018. No other focal soft tissue lesions are present in neck. No significant adenopathy is present. Upper chest: The lung apices are clear. Thoracic inlet is within normal limits. Review of the MIP images confirms the above findings CTA HEAD FINDINGS Anterior circulation: The supraclinoid right ICA is narrowed to 1.5 mm, 50% stenosis relative to the more distal vessel. No significant stenosis is present on the left. ICA termini are within normal limits bilaterally. High-grade proximal left A1 segment stenosis is present. The anterior communicating artery is patent. Irregularity is present in the distal left M1 segment without significant  stenosis. The MCA bifurcations are intact. Segmental narrowing is present in the ACA and MCA branch vessels bilaterally. High-grade stenosis is present in the right pericallosal artery. Moderate stenosis is present in the mid right frontotemporal branch. Posterior circulation: Left vertebral artery is dominant. PICA origins are visualized and normal. Vertebrobasilar junction is normal. Both posterior cerebral arteries originate from the basilar tip. High-grade proximal P2 segment stenosis is present on the right. This results in marked attenuation of distal PCA branch vessels. Superior division occlusion is present. Moderate proximal left P2 segment stenosis is present. Distal PCA branch filling is better than on the right. Marked segmental attenuation is present in the distal branches. Venous sinuses: The dural sinuses are patent. The straight sinus deep cerebral veins are intact. Cortical veins are within normal limits. No significant vascular malformation is evident. Anatomic variants: None Review of the MIP images confirms the above findings IMPRESSION: 1. Proximal right P2 segment high-grade stenosis with marked attenuation of distal right PCA branch vessels. This corresponds to the right PCA territory acute/subacute infarcts. 2. Right P3 superior division superior division occlusion. 3. Moderate proximal left P2 segment stenosis. 4. High-grade proximal left A1 segment stenosis. 5. Moderate stenosis of the mid right frontotemporal branch, right M3. 6. Segmental narrowing of the ACA and MCA branch vessels bilaterally. 7. 50% stenosis of the supraclinoid right ICA. 8. Minimal atherosclerotic changes at the carotid bifurcations bilaterally without significant stenosis. 9. Soft tissue mass posterior to the right submandibular gland extends to the hyoid. This appears separate from the carotid sheath. There is no significant growth since 2018. 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  Paraganglioma is considered as well. MRI of the neck without and with contrast after the acute setting would be useful for further evaluation as clinically indicated. Electronically Signed   By: San Morelle M.D.   On: 02/01/2020 05:19   MR Brain W Wo Contrast  Result Date: 01/31/2020 CLINICAL DATA:  Confusion, blurry vision EXAM: MRI HEAD WITHOUT AND WITH CONTRAST TECHNIQUE: Multiplanar, multiecho pulse sequences of the brain and surrounding structures were obtained without and with intravenous contrast. CONTRAST:  52mL MULTIHANCE GADOBENATE DIMEGLUMINE 529 MG/ML IV SOLN COMPARISON:  None. FINDINGS: Brain: There is diffusion hyperintensity with variable ADC in the right occipital lobe. Additional involvement of the right perisplenial white matter and right caudothalamic groove. Corresponding enhancement is noted. There is some intrinsic T1 hyperintensity associated with the right occipital region, which may reflect laminar necrosis or blood products, noting some susceptibility as  well. There is no definite abnormal diffusion signal associated with a small focus of enhancement of the right parietal lobe along parieto-occipital sulcus (series 14, image 88). Additional confluent areas of T2 hyperintensity in the supratentorial greater than pontine white matter are nonspecific but may reflect moderate to advanced chronic microvascular ischemic changes. There are chronic small vessel infarcts of the cerebellum, basal ganglia, and thalamus. There is no hydrocephalus or extra-axial fluid collection. Vascular: Major vessel flow voids at the skull base are preserved. Skull and upper cervical spine: Normal marrow signal is preserved. Sinuses/Orbits: Paranasal sinuses are aerated. Bilateral lens replacements. Other: Sella is unremarkable.  Mastoid air cells are clear. IMPRESSION: Abnormal signal and enhancement involving the right occipital lobe, right perisplenial white matter, and right caudothalamic groove.  Favored to reflect acute (occipital) and subacute infarcts. Moderate to advanced chronic microvascular ischemic changes. These results were called by telephone at the time of interpretation on 01/31/2020 at 2:56 pm to provider Scarlette Calico , who verbally acknowledged these results. Patient is to go to the emergency department for further evaluation. Electronically Signed   By: Macy Mis M.D.   On: 01/31/2020 15:07   EEG adult  Result Date: 02/01/2020 Lora Havens, MD     02/01/2020 12:40 PM Patient Name: Jude Linck MRN: [id number] Epilepsy Attending: Lora Havens Referring Physician/Provider: Dr. Kathrynn Speed Date: 02/01/2020 Duration: 24.33 mins Patient history: 86 year old female with multiple infarcts as well as intermittent paresthesias.  EEG to evaluate for seizures. Level of alertness: Awake, drowsy AEDs during EEG study: None Technical aspects: This EEG study was done with scalp electrodes positioned according to the 10-20 International system of electrode placement. Electrical activity was acquired at a sampling rate of 500Hz  and reviewed with a high frequency filter of 70Hz  and a low frequency filter of 1Hz . EEG data were recorded continuously and digitally stored. Description: The posterior dominant rhythm consists of 8 Hz activity of moderate voltage (25-35 uV) seen predominantly in posterior head regions, symmetric and reactive to eye opening and eye closing. Drowsiness was characterized by attenuation of the posterior background rhythm. Physiology photic driving was not seen during photic stimulation.  Hyperventilation was not performed.   IMPRESSION: This study is within normal limits. No seizures or epileptiform discharges were seen throughout the recording. Lora Havens   ECHOCARDIOGRAM COMPLETE  Result Date: 02/01/2020    ECHOCARDIOGRAM REPORT   Patient Name:   KARRIE FLUELLEN Date of Exam: 02/01/2020 Medical Rec #:  [id number]   Height:       66.0 in Accession #:     [id number]  Weight:       156.4 lb Date of Birth:  12-25-1931  BSA:          1.801 m Patient Age:    49 years    BP:           145/78 mmHg Patient Gender: F           HR:           65 bpm. Exam Location:  Inpatient Procedure: 2D Echo Indications:    Stroke 434.91 / I163.9  History:        Patient has no prior history of Echocardiogram examinations.                 Risk Factors:Hypertension and Dyslipidemia.  Sonographer:    Darlina Sicilian RDCS Referring Phys: [id number] [place]  1. Left ventricular ejection fraction, by estimation, is 65 to 70%.  The left ventricle has normal function. The left ventricle has no regional wall motion abnormalities. Left ventricular diastolic parameters are consistent with Grade I diastolic dysfunction (impaired relaxation). Elevated left ventricular end-diastolic pressure.  2. Right ventricular systolic function is normal. The right ventricular size is normal. Tricuspid regurgitation signal is inadequate for assessing PA pressure.  3. The mitral valve is normal in structure. Trivial mitral valve regurgitation. No evidence of mitral stenosis.  4. The aortic valve was not well visualized. Aortic valve regurgitation is mild.  5. The inferior vena cava is normal in size with greater than 50% respiratory variability, suggesting right atrial pressure of 3 mmHg. FINDINGS  Left Ventricle: Left ventricular ejection fraction, by estimation, is 65 to 70%. The left ventricle has normal function. The left ventricle has no regional wall motion abnormalities. The left ventricular internal cavity size was normal in size. There is  no left ventricular hypertrophy. Left ventricular diastolic parameters are consistent with Grade I diastolic dysfunction (impaired relaxation). Elevated left ventricular end-diastolic pressure. Right Ventricle: The right ventricular size is normal. No increase in right ventricular wall thickness. Right ventricular systolic function is normal. Tricuspid  regurgitation signal is inadequate for assessing PA pressure. Left Atrium: Left atrial size was normal in size. Right Atrium: Right atrial size was normal in size. Pericardium: There is no evidence of pericardial effusion. Mitral Valve: The mitral valve is normal in structure. Normal mobility of the mitral valve leaflets. Trivial mitral valve regurgitation. No evidence of mitral valve stenosis. Tricuspid Valve: The tricuspid valve is normal in structure. Tricuspid valve regurgitation is trivial. No evidence of tricuspid stenosis. Aortic Valve: The aortic valve was not well visualized. Aortic valve regurgitation is mild. Pulmonic Valve: The pulmonic valve was normal in structure. Pulmonic valve regurgitation is not visualized. No evidence of pulmonic stenosis. Aorta: The aortic root was not well visualized and the ascending aorta was not well visualized. Venous: The inferior vena cava is normal in size with greater than 50% respiratory variability, suggesting right atrial pressure of 3 mmHg. IAS/Shunts: No atrial level shunt detected by color flow Doppler.  LEFT VENTRICLE PLAX 2D LVIDd:         3.80 cm  Diastology LVIDs:         2.30 cm  LV e' lateral:   5.77 cm/s LV PW:         1.00 cm  LV E/e' lateral: 13.8 LV IVS:        1.10 cm  LV e' medial:    6.31 cm/s LVOT diam:     2.10 cm  LV E/e' medial:  12.6 LV SV:         52 LV SV Index:   29 LVOT Area:     3.46 cm  RIGHT VENTRICLE RV S prime:     16.10 cm/s TAPSE (M-mode): 1.8 cm LEFT ATRIUM             Index       RIGHT ATRIUM          Index LA diam:        3.10 cm 1.72 cm/m  RA Area:     7.13 cm LA Vol (A2C):   31.7 ml 17.60 ml/m RA Volume:   9.63 ml  5.35 ml/m LA Vol (A4C):   32.7 ml 18.15 ml/m LA Biplane Vol: 33.5 ml 18.60 ml/m  AORTIC VALVE LVOT Vmax:   69.00 cm/s LVOT Vmean:  46.500 cm/s LVOT VTI:    0.151 m  AORTA  Ao Root diam: 3.20 cm MITRAL VALVE MV Area (PHT): 3.06 cm     SHUNTS MV Decel Time: 248 msec     Systemic VTI:  0.15 m MV E velocity: 79.60  cm/s   Systemic Diam: 2.10 cm MV A velocity: 120.00 cm/s MV E/A ratio:  0.66 Fransico Him MD Electronically signed by Fransico Him MD Signature Date/Time: 02/01/2020/1:50:56 PM    Final     Assessment & Plan:   Suhana was seen today for hypertension.  Diagnoses and all orders for this visit:  Cerebrovascular accident (CVA) due to stenosis of right posterior cerebral artery ([place])- Improvement noted.  Will continue dual antiplatelet therapy and the statin.  I have recommended that she start physical therapy at home. -     Ambulatory referral to [place] hypertension- Her BP is well controlled.  Vitamin B12 deficiency neuropathy (HCC) -     cyanocobalamin ((VITAMIN B-12)) injection 1,000 mcg   I have discontinued Keiona Lyford's b complex vitamins and Phenazopyridine HCl (AZO-STANDARD PO). I am also having her maintain her DULoxetine, vitamin B-12, aspirin EC, atorvastatin, and clopidogrel. We administered cyanocobalamin.  Meds ordered this encounter  Medications  . cyanocobalamin ((VITAMIN B-12)) injection 1,000 mcg     Follow-up: Return in about 6 months (around 08/09/2020).  Scarlette Calico, MD

## 2020-02-08 NOTE — Patient Instructions (Signed)
Ischemic Stroke  An ischemic stroke (cerebrovascular accident, or CVA) is the sudden death of brain tissue that occurs when an area of the brain does not get enough oxygen. It is a medical emergency that must be treated right away. An ischemic stroke can cause permanent loss of brain function. This can cause problems with how different parts of your body function. What are the causes? This condition is caused by a decrease of oxygen supply to an area of the brain, which may be the result of:  A small blood clot (embolus) or a buildup of plaque in the blood vessels (atherosclerosis) that blocks blood flow in the brain.  An abnormal heart rhythm (atrial fibrillation).  A blocked or damaged artery in the head or neck. Sometimes the cause of stroke is not known (cryptogenic). What increases the risk? Certain factors may make you more likely to develop this condition. Some of these factors are things that you can change, such as:  Obesity.  Smoking cigarettes.  Taking oral birth control, especially if you also use tobacco.  Physical inactivity.  Excessive alcohol use.  Use of illegal drugs, especially cocaine and methamphetamine. Other risk factors include:  High blood pressure (hypertension).  High cholesterol.  Diabetes mellitus.  Heart disease.  Being African American, Native American, Hispanic, or [place] Native.  Being over age 60.  Family history of stroke.  Previous history of blood clots, stroke, or transient ischemic attack (TIA).  Sickle cell disease.  Being a woman with a history of preeclampsia.  Migraine headache.  Sleep apnea.  Irregular heartbeats, such as atrial fibrillation.  Chronic inflammatory diseases, such as rheumatoid arthritis or lupus.  Blood clotting disorders (hypercoagulable state). What are the signs or symptoms? Symptoms of this condition usually develop suddenly, or you may notice them after waking up from sleep. Symptoms may include  sudden:  Weakness or numbness in your face, arm, or leg, especially on one side of your body.  Trouble walking or difficulty moving your arms or legs.  Loss of balance or coordination.  Confusion.  Slurred speech (dysarthria).  Trouble speaking, understanding speech, or both (aphasia).  Vision changes--such as double vision, blurred vision, or loss of vision--in one or both eyes.  Dizziness.  Nausea and vomiting.  Severe headache with no known cause. The headache is often described as the worst headache ever experienced. If possible, make note of the exact time that you last felt like your normal self and what time your symptoms started. Tell your health care provider. If symptoms come and go, this could be a sign of a warning stroke, or TIA. Get help right away, even if you feel better. How is this diagnosed? This condition may be diagnosed based on:  Your symptoms, your medical history, and a physical exam.  CT scan of the brain.  MRI.  CT angiogram. This test uses a computer to take X-rays of your arteries. A dye may be injected into your blood to show the inside of your blood vessels more clearly.  MRI angiogram. This is a type of MRI that is used to evaluate the blood vessels.  Cerebral angiogram. This test uses X-rays and a dye to show the blood vessels in the brain and neck. You may need to see a health care provider who specializes in stroke care. A stroke specialist can be seen in person or through communication using telephone or television technology (telemedicine). Other tests may also be done to find the cause of the stroke, such   as:  Electrocardiogram (ECG).  Continuous heart monitoring.  Echocardiogram.  Transesophageal echocardiogram (TEE).  Carotid ultrasound.  A scan of the brain circulation.  Blood tests.  Sleep study to check for sleep apnea. How is this treated? Treatment for this condition will depend on the duration, severity, and cause  of your symptoms and on the area of the brain affected. It is very important to get treatment at the first sign of stroke symptoms. Some treatments work better if they are done within 3-6 hours of the onset of stroke symptoms. These initial treatments may include:  Aspirin.  Medicines to control blood pressure.  Medicine given by injection to dissolve the blood clot (thrombolytic).  Treatments given directly to the affected artery to remove or dissolve the blood clot. Other treatment options may include:  Oxygen.  IV fluids.  Medicines to thin the blood (anticoagulants or antiplatelets).  Procedures to increase blood flow. Medicines and changes to your diet may be used to help treat and manage risk factors for stroke, such as diabetes, high cholesterol, and high blood pressure. After a stroke, you may work with physical, speech, mental health, or occupational therapists to help you recover. Follow these instructions at home: Medicines  Take over-the-counter and prescription medicines only as told by your health care provider.  If you were told to take a medicine to thin your blood, such as aspirin or an anticoagulant, take it exactly as told by your health care provider. ? Taking too much blood-thinning medicine can cause bleeding. ? If you do not take enough blood-thinning medicine, you will not have the protection that you need against another stroke and other problems.  Understand the side effects of taking anticoagulant medicine. When taking this type of medicine, make sure you: ? Hold pressure over any cuts for longer than usual. ? Tell your dentist and other health care providers that you are taking anticoagulants before you have any procedures that may cause bleeding. ? Avoid activities that may cause trauma or injury. Eating and drinking  Follow instructions from your health care provider about diet.  Eat healthy foods.  If your ability to swallow was affected by the  stroke, you may need to take steps to avoid choking, such as: ? Taking small bites when eating. ? Eating foods that are soft or pureed. Safety  Follow instructions from your health care team about physical activity.  Use a walker or cane as told by your health care provider.  Take steps to create a safe home environment in order to reduce the risk of falls. This may include: ? Having your home looked at by specialists. ? Installing grab bars in the bedroom and bathroom. ? Using safety equipment, such as raised toilets and a seat in the shower. General instructions  Do not use any tobacco products, such as cigarettes, chewing tobacco, and e-cigarettes. If you need help quitting, ask your health care provider.  Limit alcohol intake to no more than 1 drink a day for nonpregnant women and 2 drinks a day for men. One drink equals 12 oz of beer, 5 oz of wine, or 1 oz of hard liquor.  If you need help to stop using drugs or alcohol, ask your health care provider about a referral to a program or specialist.  Maintain an active and healthy lifestyle. Get regular exercise as told by your health care provider.  Keep all follow-up visits as told by your health care provider, including visits with all specialists on your   health care team. This is important. How is this prevented? Your risk of another stroke can be decreased by managing high blood pressure, high cholesterol, diabetes, heart disease, sleep apnea, and obesity. It can also be decreased by quitting smoking, limiting alcohol, and staying physically active. Your health care provider will continue to work with you on measures to prevent short-term and long-term complications of stroke. Get help right away if:   You have any symptoms of a stroke. "BE FAST" is an easy way to remember the main warning signs of a stroke: ? B - Balance. Signs are dizziness, sudden trouble walking, or loss of balance. ? E - Eyes. Signs are trouble seeing or a  sudden change in vision. ? F - Face. Signs are sudden weakness or numbness of the face, or the face or eyelid drooping on one side. ? A - Arms. Signs are weakness or numbness in an arm. This happens suddenly and usually on one side of the body. ? S - Speech. Signs are sudden trouble speaking, slurred speech, or trouble understanding what people say. ? T - Time. Time to call emergency services. Write down what time symptoms started.  You have other signs of a stroke, such as: ? A sudden, severe headache with no known cause. ? Nausea or vomiting. ? Seizure.  These symptoms may represent a serious problem that is an emergency. Do not wait to see if the symptoms will go away. Get medical help right away. Call your local emergency services (911 in the [place] Do not drive yourself to the hospital. Summary  An ischemic stroke (cerebrovascular accident, or CVA) is the sudden death of brain tissue that occurs when an area of the brain does not get enough oxygen.  Symptoms of this condition usually develop suddenly, or you may notice them after waking up from sleep.  It is very important to get treatment at the first sign of stroke symptoms. Stroke is a medical emergency that must be treated right away. This information is not intended to replace advice given to you by your health care provider. Make sure you discuss any questions you have with your health care provider. Document Revised: 04/17/2018 Document Reviewed: 10/25/2015 Elsevier Patient Education  2020 Else[employer] Inc[employer]  

## 2020-02-14 DIAGNOSIS — N390 Urinary tract infection, site not specified: Secondary | ICD-10-CM | POA: Diagnosis not present

## 2020-02-14 DIAGNOSIS — E785 Hyperlipidemia, unspecified: Secondary | ICD-10-CM | POA: Diagnosis not present

## 2020-02-14 DIAGNOSIS — I69354 Hemiplegia and hemiparesis following cerebral infarction affecting left non-dominant side: Secondary | ICD-10-CM | POA: Diagnosis not present

## 2020-02-14 DIAGNOSIS — Z7982 Long term (current) use of aspirin: Secondary | ICD-10-CM | POA: Diagnosis not present

## 2020-02-14 DIAGNOSIS — F418 Other specified anxiety disorders: Secondary | ICD-10-CM | POA: Diagnosis not present

## 2020-02-14 DIAGNOSIS — E538 Deficiency of other specified B group vitamins: Secondary | ICD-10-CM | POA: Diagnosis not present

## 2020-02-14 DIAGNOSIS — D37032 Neoplasm of uncertain behavior of the submandibular salivary glands: Secondary | ICD-10-CM | POA: Diagnosis not present

## 2020-02-14 DIAGNOSIS — I10 Essential (primary) hypertension: Secondary | ICD-10-CM | POA: Diagnosis not present

## 2020-02-14 DIAGNOSIS — Z7902 Long term (current) use of antithrombotics/antiplatelets: Secondary | ICD-10-CM | POA: Diagnosis not present

## 2020-02-14 DIAGNOSIS — Z87440 Personal history of urinary (tract) infections: Secondary | ICD-10-CM | POA: Diagnosis not present

## 2020-02-14 DIAGNOSIS — I69322 Dysarthria following cerebral infarction: Secondary | ICD-10-CM | POA: Diagnosis not present

## 2020-02-14 DIAGNOSIS — Z9181 History of falling: Secondary | ICD-10-CM | POA: Diagnosis not present

## 2020-02-15 ENCOUNTER — Telehealth: Payer: Self-pay

## 2020-02-15 ENCOUNTER — Telehealth: Payer: Self-pay | Admitting: Internal Medicine

## 2020-02-15 DIAGNOSIS — I10 Essential (primary) hypertension: Secondary | ICD-10-CM | POA: Diagnosis not present

## 2020-02-15 DIAGNOSIS — F418 Other specified anxiety disorders: Secondary | ICD-10-CM | POA: Diagnosis not present

## 2020-02-15 DIAGNOSIS — I69354 Hemiplegia and hemiparesis following cerebral infarction affecting left non-dominant side: Secondary | ICD-10-CM | POA: Diagnosis not present

## 2020-02-15 DIAGNOSIS — E785 Hyperlipidemia, unspecified: Secondary | ICD-10-CM | POA: Diagnosis not present

## 2020-02-15 DIAGNOSIS — N390 Urinary tract infection, site not specified: Secondary | ICD-10-CM | POA: Diagnosis not present

## 2020-02-15 DIAGNOSIS — I69322 Dysarthria following cerebral infarction: Secondary | ICD-10-CM | POA: Diagnosis not present

## 2020-02-15 NOTE — Telephone Encounter (Signed)
Home Health Verbal Orders - Caller/Agency: [name] with [facility] Number: [phone] (can leave a voicemail) Requesting OT/PT/Skilled Nursing/Social Work/Speech Therapy: Nursing Frequency:  1x a week for 7 weeks

## 2020-02-15 NOTE — Telephone Encounter (Signed)
Left detailed message for [name] with [facility] giving verbal okay for nursing as requested.

## 2020-02-15 NOTE — Telephone Encounter (Signed)
Jocelyn [name] with [name] called and he performed a recent physical therapy evalution from a recent stroke. He recommenced 1 week 1 2 week 4 1 week 4 Pt reported that she had fallen a few days ago, no injuries, she slipped from the bed.

## 2020-02-16 ENCOUNTER — Ambulatory Visit: Payer: BLUE CROSS/BLUE SHIELD | Admitting: Internal Medicine

## 2020-02-16 ENCOUNTER — Other Ambulatory Visit: Payer: Self-pay

## 2020-02-16 ENCOUNTER — Ambulatory Visit (INDEPENDENT_AMBULATORY_CARE_PROVIDER_SITE_OTHER): Payer: Medicare Other

## 2020-02-16 VITALS — BP 118/80 | HR 74 | Temp 98.0°F | Resp 16 | Ht 66.0 in | Wt 154.6 lb

## 2020-02-16 DIAGNOSIS — Z0000 Encounter for general adult medical examination without abnormal findings: Secondary | ICD-10-CM

## 2020-02-16 NOTE — Telephone Encounter (Signed)
Left detailed message with verbal okay for PT orders as requested.   We will reach to pt regarding fall from bed a few days ago.    Tried to call patient to check on her.  LVM - requested pt or family to let us know if we can be of any additional assistance.

## 2020-02-16 NOTE — Patient Instructions (Addendum)
Jocelyn [name] , Thank you for taking time to come for your Medicare Wellness Visit. I appreciate your ongoing commitment to your health goals. Please review the following plan we discussed and let me know if I can assist you in the future.   Screening recommendations/referrals: Colonoscopy: never done Mammogram: 07/17/2012 Bone Density: 06/09/2013 Recommended yearly ophthalmology/optometry visit for glaucoma screening and checkup Recommended yearly dental visit for hygiene and checkup  Vaccinations: Influenza vaccine: 06/23/2019 Pneumococcal vaccine: completed Tdap vaccine: 09/20/2010; due every 10 years Shingles vaccine: never done; Shingrx handout provided to patient and daughter   Covid-19: completed  Advanced directives: Advance directive discussed with you today. I have provided a copy for you to complete at home and have notarized. Once this is complete please bring a copy in to our office so we can scan it into your chart.  Conditions/risks identified: Please continue to do your personal lifestyle choices by: daily care of teeth and gums, regular physical activity (goal should be 5 days a week for 30 minutes), eat a healthy diet, avoid tobacco and drug use, limiting any alcohol intake, taking a low-dose aspirin (if not allergic or have been advised by your provider otherwise) and taking vitamins and minerals as recommended by your provider. Continue doing brain stimulating activities (puzzles, reading, adult coloring books, staying active) to keep memory sharp. Continue to eat heart healthy diet (full of fruits, vegetables, whole grains, lean protein, water--limit salt, fat, and sugar intake) and increase physical activity as tolerated.  Next appointment: Please schedule your next Medicare Wellness Visit with your Nurse Health Advisor in 1 year.   Preventive Care 84 Years and Older, Female Preventive care refers to lifestyle choices and visits with your health care provider that can promote  health and wellness. What does preventive care include?  A yearly physical exam. This is also called an annual well check.  Dental exams once or twice a year.  Routine eye exams. Ask your health care provider how often you should have your eyes checked.  Personal lifestyle choices, including:  Daily care of your teeth and gums.  Regular physical activity.  Eating a healthy diet.  Avoiding tobacco and drug use.  Limiting alcohol use.  Practicing safe sex.  Taking low-dose aspirin every day.  Taking vitamin and mineral supplements as recommended by your health care provider. What happens during an annual well check? The services and screenings done by your health care provider during your annual well check will depend on your age, overall health, lifestyle risk factors, and family history of disease. Counseling  Your health care provider may ask you questions about your:  Alcohol use.  Tobacco use.  Drug use.  Emotional well-being.  Home and relationship well-being.  Sexual activity.  Eating habits.  History of falls.  Memory and ability to understand (cognition).  Work and work Statistician.  Reproductive health. Screening  You may have the following tests or measurements:  Height, weight, and BMI.  Blood pressure.  Lipid and cholesterol levels. These may be checked every 5 years, or more frequently if you are over 45 years old.  Skin check.  Lung cancer screening. You may have this screening every year starting at age 55 if you have a 30-pack-year history of smoking and currently smoke or have quit within the past 15 years.  Fecal occult blood test (FOBT) of the stool. You may have this test every year starting at age 62.  Flexible sigmoidoscopy or colonoscopy. You may have a sigmoidoscopy every  5 years or a colonoscopy every 10 years starting at age 70.  Hepatitis C blood test.  Hepatitis B blood test.  Sexually transmitted disease (STD)  testing.  Diabetes screening. This is done by checking your blood sugar (glucose) after you have not eaten for a while (fasting). You may have this done every 1-3 years.  Bone density scan. This is done to screen for osteoporosis. You may have this done starting at age 41.  Mammogram. This may be done every 1-2 years. Talk to your health care provider about how often you should have regular mammograms. Talk with your health care provider about your test results, treatment options, and if necessary, the need for more tests. Vaccines  Your health care provider may recommend certain vaccines, such as:  Influenza vaccine. This is recommended every year.  Tetanus, diphtheria, and acellular pertussis (Tdap, Td) vaccine. You may need a Td booster every 10 years.  Zoster vaccine. You may need this after age 84.  Pneumococcal 13-valent conjugate (PCV13) vaccine. One dose is recommended after age 84.  Pneumococcal polysaccharide (PPSV23) vaccine. One dose is recommended after age 20. Talk to your health care provider about which screenings and vaccines you need and how often you need them. This information is not intended to replace advice given to you by your health care provider. Make sure you discuss any questions you have with your health care provider. Document Released: 08/25/2015 Document Revised: 04/17/2016 Document Reviewed: 05/30/2015 Elsevier Interactive Patient Education  2017 [place] Prevention in the Home Falls can cause injuries. They can happen to people of all ages. There are many things you can do to make your home safe and to help prevent falls. What can I do on the outside of my home?  Regularly fix the edges of walkways and driveways and fix any cracks.  Remove anything that might make you trip as you walk through a door, such as a raised step or threshold.  Trim any bushes or trees on the path to your home.  Use bright outdoor lighting.  Clear any walking  paths of anything that might make someone trip, such as rocks or tools.  Regularly check to see if handrails are loose or broken. Make sure that both sides of any steps have handrails.  Any raised decks and porches should have guardrails on the edges.  Have any leaves, snow, or ice cleared regularly.  Use sand or salt on walking paths during winter.  Clean up any spills in your garage right away. This includes oil or grease spills. What can I do in the bathroom?  Use night lights.  Install grab bars by the toilet and in the tub and shower. Do not use towel bars as grab bars.  Use non-skid mats or decals in the tub or shower.  If you need to sit down in the shower, use a plastic, non-slip stool.  Keep the floor dry. Clean up any water that spills on the floor as soon as it happens.  Remove soap buildup in the tub or shower regularly.  Attach bath mats securely with double-sided non-slip rug tape.  Do not have throw rugs and other things on the floor that can make you trip. What can I do in the bedroom?  Use night lights.  Make sure that you have a light by your bed that is easy to reach.  Do not use any sheets or blankets that are too big for your bed. They should not  hang down onto the floor.  Have a firm chair that has side arms. You can use this for support while you get dressed.  Do not have throw rugs and other things on the floor that can make you trip. What can I do in the kitchen?  Clean up any spills right away.  Avoid walking on wet floors.  Keep items that you use a lot in easy-to-reach places.  If you need to reach something above you, use a strong step stool that has a grab bar.  Keep electrical cords out of the way.  Do not use floor polish or wax that makes floors slippery. If you must use wax, use non-skid floor wax.  Do not have throw rugs and other things on the floor that can make you trip. What can I do with my stairs?  Do not leave any items  on the stairs.  Make sure that there are handrails on both sides of the stairs and use them. Fix handrails that are broken or loose. Make sure that handrails are as long as the stairways.  Check any carpeting to make sure that it is firmly attached to the stairs. Fix any carpet that is loose or worn.  Avoid having throw rugs at the top or bottom of the stairs. If you do have throw rugs, attach them to the floor with carpet tape.  Make sure that you have a light switch at the top of the stairs and the bottom of the stairs. If you do not have them, ask someone to add them for you. What else can I do to help prevent falls?  Wear shoes that:  Do not have high heels.  Have rubber bottoms.  Are comfortable and fit you well.  Are closed at the toe. Do not wear sandals.  If you use a stepladder:  Make sure that it is fully opened. Do not climb a closed stepladder.  Make sure that both sides of the stepladder are locked into place.  Ask someone to hold it for you, if possible.  Clearly mark and make sure that you can see:  Any grab bars or handrails.  First and last steps.  Where the edge of each step is.  Use tools that help you move around (mobility aids) if they are needed. These include:  Canes.  Walkers.  Scooters.  Crutches.  Turn on the lights when you go into a dark area. Replace any light bulbs as soon as they burn out.  Set up your furniture so you have a clear path. Avoid moving your furniture around.  If any of your floors are uneven, fix them.  If there are any pets around you, be aware of where they are.  Review your medicines with your doctor. Some medicines can make you feel dizzy. This can increase your chance of falling. Ask your doctor what other things that you can do to help prevent falls. This information is not intended to replace advice given to you by your health care provider. Make sure you discuss any questions you have with your health care  provider. Document Released: 05/25/2009 Document Revised: 01/04/2016 Document Reviewed: 09/02/2014 Elsevier Interactive Patient Education  2017 [employer].

## 2020-02-16 NOTE — Progress Notes (Signed)
Subjective:   Jocelyn [name] is a 86 y.o. female who presents for Medicare Annual (Subsequent) preventive examination.  Review of Systems    No ROS. Medicare Wellness Visit. Additional risk factors are reflected in social history. Cardiac Risk Factors include: advanced age (>58men, >41 women);hypertension;family history of premature cardiovascular disease     Objective:    Today's Vitals   02/16/20 1441  BP: 118/80  Pulse: 74  Resp: 16  Temp: 98 F (36.7 C)  TempSrc: Oral  SpO2: 98%  Weight: 154 lb 9.6 oz (70.1 kg)  Height: 5\' 6"  (1.676 m)  PainSc: 0-No pain   Body mass index is 24.95 kg/m.  Advanced Directives 02/16/2020 12/08/2017 03/11/2016 01/04/2015  Does Patient Have a Medical Advance Directive? No No Yes No  Does patient want to make changes to medical advance directive? Yes (MAU/Ambulatory/Procedural Areas - Information given) - - -  Copy of [place] in Chart? - - Yes -  Would patient like information on creating a medical advance directive? - Yes (ED - Information included in AVS) - Yes - Educational materials given    Current Medications (verified) Outpatient Encounter Medications as of 02/16/2020  Medication Sig  . aspirin EC 81 MG tablet Take 1 tablet (81 mg total) by mouth daily for 21 days. Swallow whole.  Marland Kitchen atorvastatin (LIPITOR) 40 MG tablet Take 1 tablet (40 mg total) by mouth daily.  . clopidogrel (PLAVIX) 75 MG tablet Take 1 tablet (75 mg total) by mouth daily.  . DULoxetine (CYMBALTA) 30 MG capsule Take 1 capsule (30 mg total) by mouth daily.  . vitamin B-12 (CYANOCOBALAMIN) 1000 MCG tablet Take 1,000 mcg by mouth daily.   No facility-administered encounter medications on file as of 02/16/2020.    Allergies (verified) Codeine and Tape   History: Past Medical History:  Diagnosis Date  . HTN (hypertension)   . Hyperlipidemia   . Urinary incontinence    Past Surgical History:  Procedure Laterality Date  . ABDOMINAL HYSTERECTOMY     . TONSILLECTOMY     Family History  Problem Relation Age of Onset  . Diabetes Other        1st degree relative  . Hyperlipidemia Other   . Stroke Other        1st degree Female relative <50  . Cancer Neg Hx   . Early death Neg Hx   . Heart disease Neg Hx   . Hypertension Neg Hx   . Kidney disease Neg Hx    Social History   Socioeconomic History  . Marital status: Widowed    Spouse name: Not on file  . Number of children: 1  . Years of education: Not on file  . Highest education level: Not on file  Occupational History  . Occupation: Retired  Tobacco Use  . Smoking status: Former Research scientist (life sciences)  . Smokeless tobacco: Former Systems developer    Quit date: 03/17/2017  . Tobacco comment: STATES SHE QUIT 25 YEARS AGO  Vaping Use  . Vaping Use: Never used  Substance and Sexual Activity  . Alcohol use: Yes    Alcohol/week: 14.0 standard drinks    Types: 7 Glasses of wine, 7 Cans of beer per week  . Drug use: No  . Sexual activity: Never    Birth control/protection: Surgical  Other Topics Concern  . Not on file  Social History Narrative   Regular Exercise-no         Social Determinants of Health   Financial  Resource Strain: Low Risk   . Difficulty of Paying Living Expenses: Not hard at all  Food Insecurity: No Food Insecurity  . Worried About Charity fundraiser in the Last Year: Never true  . Ran Out of Food in the Last Year: Never true  Transportation Needs: No Transportation Needs  . Lack of Transportation (Medical): No  . Lack of Transportation (Non-Medical): No  Physical Activity:   . Days of Exercise per Week:   . Minutes of Exercise per Session:   Stress: No Stress Concern Present  . Feeling of Stress : Not at all  Social Connections: Unknown  . Frequency of Communication with Friends and Family: More than three times a week  . Frequency of Social Gatherings with Friends and Family: More than three times a week  . Attends Religious Services: Not on file  . Active Member of  Clubs or Organizations: Not on file  . Attends Archivist Meetings: Not on file  . Marital Status: Widowed    Tobacco Counseling Counseling given: No Comment: STATES SHE QUIT 25 YEARS AGO   Clinical Intake:  Pre-visit preparation completed: Yes  Pain : No/denies pain Pain Score: 0-No pain     BMI - recorded: 24.95 Nutritional Status: BMI of 19-24  Normal Nutritional Risks: None Diabetes: No  How often do you need to have someone help you when you read instructions, pamphlets, or other written materials from your doctor or pharmacy?: 1 - Never What is the last grade level you completed in school?: HSG  Diabetic? no  Interpreter Needed?: No  Information entered by :: Summit Borchardt N. Drue Harr, LPN   Activities of Daily Living In your present state of health, do you have any difficulty performing the following activities: 02/16/2020  Hearing? N  Vision? N  Difficulty concentrating or making decisions? Y  Comment at times  Walking or climbing stairs? N  Dressing or bathing? N  Doing errands, shopping? Y  Preparing Food and eating ? N  Using the Toilet? N  In the past six months, have you accidently leaked urine? Y  Comment wears pullups for protection  Do you have problems with loss of bowel control? N  Managing your Medications? N  Managing your Finances? N  Housekeeping or managing your Housekeeping? N  Some recent data might be hidden    Patient Care Team: Janith Lima, MD as PCP - General  Indicate any recent Medical Services you may have received from other than [facility] providers in the past year (date may be approximate).     Assessment:   This is a routine wellness examination for [facility].  Hearing/Vision screen No exam data present  Dietary issues and exercise activities discussed: Current Exercise Habits: The patient does not participate in regular exercise at present (will be starting physical therapy twice a week (starting 02/21/2020)), Exercise  limited by: neurologic condition(s)  Goals    .  patient      Take rest periods as you are able. Eliezer Lofts can review the website Derrel Nip;  You can also review the information on the Alzheimer's Asso     .  Patient Stated      Maintain my current health status, stay as healthy and as independent as possible.    .  Patient Stated (pt-stated)      To get back to being independent.      Depression Screen PHQ 2/9 Scores 02/16/2020 01/25/2020 01/02/2018 12/08/2017 03/18/2017 03/11/2016 06/09/2013  PHQ - 2  Score 0 6 0 1 1 2  0  PHQ- 9 Score - 17 - 3 - 4 -    Fall Risk Fall Risk  02/16/2020 01/25/2020 07/06/2019 03/11/2019 01/02/2018  Falls in the past year? 0 0 1 (No Data) No  Comment - - Emmi Telephone Survey: data to providers prior to load Emmi Telephone Survey: data to providers prior to load -  Number falls in past yr: 0 0 1 (No Data) -  Comment - - Emmi Telephone Survey Actual Response = 2 Emmi Telephone Survey Actual Response =  -  Injury with Fall? 0 0 1 - -  Risk Factor Category  - - - - -  Risk for fall due to : No Fall Risks No Fall Risks - - -  Risk for fall due to: Comment - - - - -  Follow up Falls evaluation completed Falls evaluation completed - - -    Any stairs in or around the home? No  If so, are there any without handrails? No  Home free of loose throw rugs in walkways, pet beds, electrical cords, etc? Yes  Adequate lighting in your home to reduce risk of falls? Yes   ASSISTIVE DEVICES UTILIZED TO PREVENT FALLS:  Life alert? No  Use of a cane, walker or w/c? Yes  Grab bars in the bathroom? Yes  Shower chair or bench in shower? Yes  Elevated toilet seat or a handicapped toilet? Yes   TIMED UP AND GO:  Was the test performed? No .  Length of time to ambulate 10 feet: 0 sec.   Gait steady and fast without use of assistive device  Cognitive Function: MMSE - Mini Mental State Exam 12/08/2017 03/11/2016  Not completed: - (No Data)  Orientation to time 5 -    Orientation to Place 5 -  Registration 3 -  Attention/ Calculation 5 -  Recall 3 -  Language- name 2 objects 2 -  Language- repeat 1 -  Language- follow 3 step command 3 -  Language- read & follow direction 1 -  Write a sentence 1 -  Copy design 1 -  Total score 30 -        Immunizations Immunization History  Administered Date(s) Administered  . Influenza Whole 05/22/2010, 06/12/2012  . Influenza, High Dose Seasonal PF 05/28/2017  . Influenza-Unspecified 05/13/2013, 06/05/2016, 04/08/2018  . Moderna SARS-COVID-2 Vaccination 12/28/2019, 01/12/2020  . Pneumococcal Conjugate-13 06/09/2013  . Pneumococcal Polysaccharide-23 03/18/2017  . Td 09/20/2010  . Zoster 09/20/2010    TDAP status: Up to date Flu Vaccine status: Up to date Pneumococcal vaccine status: Up to date Covid-19 vaccine status: Completed vaccines  Qualifies for Shingles Vaccine? Yes   Zostavax completed Yes   Shingrix Completed?: No.    Education has been provided regarding the importance of this vaccine. Patient has been advised to call insurance company to determine out of pocket expense if they have not yet received this vaccine. Advised may also receive vaccine at local pharmacy or Health Dept. Verbalized acceptance and understanding.  Screening Tests Health Maintenance  Topic Date Due  . INFLUENZA VACCINE  03/12/2020  . TETANUS/TDAP  09/20/2020  . DEXA SCAN  Completed  . COVID-19 Vaccine  Completed  . PNA vac Low Risk Adult  Completed    Health Maintenance  There are no preventive care reminders to display for this patient.  Colorectal cancer screening: No longer required.  Mammogram status: No longer required.  Bone Density status: Completed 06/09/2013. Results reflect:  Bone density results: NORMAL. Repeat every 5-10 years.  Lung Cancer Screening: (Low Dose CT Chest recommended if Age 36-80 years, 30 pack-year currently smoking OR have quit w/in 15years.) does not qualify.   Lung Cancer  Screening Referral: no  Additional Screening:  Hepatitis C Screening: does not qualify; Completed no  Vision Screening: Recommended annual ophthalmology exams for early detection of glaucoma and other disorders of the eye. Is the patient up to date with their annual eye exam?  Yes  Who is the provider or what is the name of the office in which the patient attends annual eye exams? [facility] If pt is not established with a provider, would they like to be referred to a provider to establish care? No .   Dental Screening: Recommended annual dental exams for proper oral hygiene  Community Resource Referral / Chronic Care Management: CRR required this visit?  No   CCM required this visit?  No      Plan:     I have personally reviewed and noted the following in the patient's chart:   . Medical and social history . Use of alcohol, tobacco or illicit drugs  . Current medications and supplements . Functional ability and status . Nutritional status . Physical activity . Advanced directives . List of other physicians . Hospitalizations, surgeries, and ER visits in previous 12 months . Vitals . Screenings to include cognitive, depression, and falls . Referrals and appointments  In addition, I have reviewed and discussed with patient certain preventive protocols, quality metrics, and best practice recommendations. A written personalized care plan for preventive services as well as general preventive health recommendations were provided to patient.     Sheral Flow, LPN   12/19/2922   Nurse Notes:  Patient is cogitatively intact.

## 2020-02-21 DIAGNOSIS — I69354 Hemiplegia and hemiparesis following cerebral infarction affecting left non-dominant side: Secondary | ICD-10-CM | POA: Diagnosis not present

## 2020-02-21 DIAGNOSIS — E785 Hyperlipidemia, unspecified: Secondary | ICD-10-CM | POA: Diagnosis not present

## 2020-02-21 DIAGNOSIS — I10 Essential (primary) hypertension: Secondary | ICD-10-CM | POA: Diagnosis not present

## 2020-02-21 DIAGNOSIS — I69322 Dysarthria following cerebral infarction: Secondary | ICD-10-CM | POA: Diagnosis not present

## 2020-02-21 DIAGNOSIS — N390 Urinary tract infection, site not specified: Secondary | ICD-10-CM | POA: Diagnosis not present

## 2020-02-21 DIAGNOSIS — F418 Other specified anxiety disorders: Secondary | ICD-10-CM | POA: Diagnosis not present

## 2020-02-22 ENCOUNTER — Encounter: Payer: Self-pay | Admitting: Internal Medicine

## 2020-02-23 ENCOUNTER — Other Ambulatory Visit: Payer: Self-pay

## 2020-02-23 ENCOUNTER — Encounter: Payer: Self-pay | Admitting: Internal Medicine

## 2020-02-23 ENCOUNTER — Ambulatory Visit (INDEPENDENT_AMBULATORY_CARE_PROVIDER_SITE_OTHER): Payer: Medicare Other | Admitting: Internal Medicine

## 2020-02-23 VITALS — BP 130/72 | HR 80 | Temp 98.3°F | Resp 16 | Ht 66.0 in | Wt 154.1 lb

## 2020-02-23 DIAGNOSIS — I639 Cerebral infarction, unspecified: Secondary | ICD-10-CM | POA: Diagnosis not present

## 2020-02-23 DIAGNOSIS — R7309 Other abnormal glucose: Secondary | ICD-10-CM | POA: Diagnosis not present

## 2020-02-23 DIAGNOSIS — N3281 Overactive bladder: Secondary | ICD-10-CM | POA: Diagnosis not present

## 2020-02-23 DIAGNOSIS — E538 Deficiency of other specified B group vitamins: Secondary | ICD-10-CM | POA: Diagnosis not present

## 2020-02-23 DIAGNOSIS — R300 Dysuria: Secondary | ICD-10-CM | POA: Diagnosis not present

## 2020-02-23 DIAGNOSIS — G63 Polyneuropathy in diseases classified elsewhere: Secondary | ICD-10-CM | POA: Diagnosis not present

## 2020-02-23 LAB — POC URINALSYSI DIPSTICK (AUTOMATED)
Bilirubin, UA: POSITIVE
Blood, UA: NEGATIVE
Glucose, UA: POSITIVE — AB
Ketones, UA: NEGATIVE
Leukocytes, UA: NEGATIVE
Nitrite, UA: NEGATIVE
Protein, UA: POSITIVE — AB
Spec Grav, UA: >=1.03 — AB (ref 1.010–1.025)
Urobilinogen, UA: 0.2 E.U./dL
pH, UA: 5.5 (ref 5.0–8.0)

## 2020-02-23 LAB — POCT GLUCOSE (DEVICE FOR HOME USE): Glucose Fasting, POC: 98 mg/dL (ref 70–99)

## 2020-02-23 MED ORDER — GEMTESA 75 MG PO TABS: 1.0000 | ORAL_TABLET | Freq: Every day | ORAL | 1 refills | Status: DC

## 2020-02-23 MED ORDER — CYANOCOBALAMIN 1000 MCG/ML IJ SOLN
1000.0000 ug | INTRAMUSCULAR | Status: DC
  Administered 2020-05-15 – 2020-06-26 (×2): 1000 ug via INTRAMUSCULAR

## 2020-02-23 MED ORDER — SOLIFENACIN SUCCINATE 10 MG PO TABS: 10.0000 mg | ORAL_TABLET | Freq: Every day | ORAL | 1 refills | Status: DC

## 2020-02-23 NOTE — Progress Notes (Signed)
Subjective:  Patient ID: Jocelyn [name], female    DOB: 06-Jul-1932  Age: 86 y.o. MRN: [id number]  CC: Urinary Frequency  This visit occurred during the SARS-CoV-2 public health emergency.  Safety protocols were in place, including screening questions prior to the visit, additional usage of staff PPE, and extensive cleaning of exam room while observing appropriate contact time as indicated for disinfecting solutions.    HPI Jocelyn [name] presents for f/up - She complains of a several day history of nocturia, urgency, urinary frequency, and dysuria.  She denies abdominal pain, nausea, vomiting, fever, chills, hematuria, or flank pain.  Her neurological status is gradually improving.  Outpatient Medications Prior to Visit  Medication Sig Dispense Refill  . atorvastatin (LIPITOR) 40 MG tablet Take 1 tablet (40 mg total) by mouth daily. 90 tablet 0  . clopidogrel (PLAVIX) 75 MG tablet Take 1 tablet (75 mg total) by mouth daily. 90 tablet 0  . DULoxetine (CYMBALTA) 30 MG capsule Take 1 capsule (30 mg total) by mouth daily. 90 capsule 0  . vitamin B-12 (CYANOCOBALAMIN) 1000 MCG tablet Take 1,000 mcg by mouth daily.     No facility-administered medications prior to visit.    ROS Review of Systems  Constitutional: Negative for chills, diaphoresis, fatigue and fever.  HENT: Negative.   Eyes: Negative for visual disturbance.  Respiratory: Negative for cough, chest tightness, shortness of breath and wheezing.   Cardiovascular: Negative for chest pain, palpitations and leg swelling.  Gastrointestinal: Negative for abdominal pain, constipation, diarrhea, nausea and vomiting.  Endocrine: Positive for polyuria. Negative for polydipsia and polyphagia.  Genitourinary: Positive for dysuria, frequency and urgency. Negative for difficulty urinating and vaginal discharge.  Musculoskeletal: Negative.  Negative for arthralgias and myalgias.  Skin: Negative.  Negative for color change.  Neurological: Negative.   Negative for dizziness and weakness.  Hematological: Negative for adenopathy. Does not bruise/bleed easily.  Psychiatric/Behavioral: Negative.     Objective:  BP 130/72 (BP Location: Left Arm, Patient Position: Sitting, Cuff Size: Normal)   Pulse 80   Temp 98.3 F (36.8 C) (Oral)   Resp 16   Ht 5\' 6"  (1.676 m)   Wt 154 lb 2 oz (69.9 kg)   SpO2 96%   BMI 24.88 kg/m   BP Readings from Last 3 Encounters:  02/23/20 130/72  02/16/20 118/80  02/08/20 124/66    Wt Readings from Last 3 Encounters:  02/23/20 154 lb 2 oz (69.9 kg)  02/16/20 154 lb 9.6 oz (70.1 kg)  02/08/20 156 lb (70.8 kg)    Physical Exam Vitals reviewed.  HENT:     Nose: Nose normal.  Eyes:     General: No scleral icterus.    Pupils: Pupils are equal, round, and reactive to light.  Cardiovascular:     Rate and Rhythm: Normal rate and regular rhythm.     Heart sounds: No murmur heard.   Pulmonary:     Effort: Pulmonary effort is normal.     Breath sounds: No stridor. No wheezing, rhonchi or rales.  Abdominal:     General: Abdomen is flat. Bowel sounds are normal. There is no distension.     Palpations: Abdomen is soft. There is no hepatomegaly, splenomegaly or mass.     Tenderness: There is no abdominal tenderness. There is no right CVA tenderness or left CVA tenderness.  Musculoskeletal:        General: Normal range of motion.     Cervical back: Neck supple.     Right  lower leg: No edema.     Left lower leg: No edema.  Lymphadenopathy:     Cervical: No cervical adenopathy.  Skin:    General: Skin is warm and dry.     Coloration: Skin is not pale.  Neurological:     General: No focal deficit present.     Mental Status: She is alert.     Lab Results  Component Value Date   WBC 8.4 01/31/2020   HGB 13.8 01/31/2020   HCT 41.5 01/31/2020   PLT 298 01/31/2020   GLUCOSE 88 01/31/2020   CHOL 198 02/01/2020   TRIG 122 02/01/2020   HDL 51 02/01/2020   LDLDIRECT 128.9 04/08/2011   LDLCALC 123  (H) 02/01/2020   ALT 10 01/31/2020   AST 16 01/31/2020   NA 133 (L) 01/31/2020   K 4.3 01/31/2020   CL 102 01/31/2020   CREATININE 0.92 02/01/2020   BUN 11 01/31/2020   CO2 23 01/31/2020   TSH 1.09 01/25/2020   INR 1.0 01/31/2020   HGBA1C 4.7 (L) 02/01/2020    CT ANGIO HEAD W OR WO CONTRAST  Result Date: 02/01/2020 CLINICAL DATA:  Recent confusion beginning approximately 1 week ago. Right-sided numbness. Abnormal MRI with acute/subacute right PCA territory infarcts. EXAM: CT ANGIOGRAPHY HEAD AND NECK TECHNIQUE: Multidetector CT imaging of the head and neck was performed using the standard protocol during bolus administration of intravenous contrast. Multiplanar CT image reconstructions and MIPs were obtained to evaluate the vascular anatomy. Carotid stenosis measurements (when applicable) are obtained utilizing NASCET criteria, using the distal internal carotid diameter as the denominator. CONTRAST:  53mL OMNIPAQUE IOHEXOL 350 MG/ML SOLN COMPARISON:  MR head without and with contrast 01/31/2020 FINDINGS: CT HEAD FINDINGS Brain: Medial right occipital lobe infarct is again noted. Advanced white matter disease is stable. No acute hemorrhage is present. The ventricles are of normal size. No significant extraaxial fluid collection is present. The brainstem and cerebellum are within normal limits. Vascular: No significant vascular calcifications are present. Skull: Calvarium is intact. No focal lytic or blastic lesions are present. No significant extracranial soft tissue lesion is present. Sinuses: The paranasal sinuses and mastoid air cells are clear. Orbits: Bilateral lens replacements are noted. Globes and orbits are otherwise unremarkable. Review of the MIP images confirms the above findings CTA NECK FINDINGS Aortic arch: A 3 vessel arch configuration is present. Atherosclerotic changes are noted at the origins the great vessels and in the distal arch without significant stenosis or aneurysm. Right  carotid system: The right common carotid artery is tortuous without focal stenosis. Atherosclerotic changes are noted at the bifurcation. Proximal right ICA is narrowed to 3 mm without significant stenosis relative to the more distal vessel. Distal right ICA is otherwise normal. Left carotid system: The left common carotid artery is tortuous without significant stenosis. Minimal atherosclerotic changes are present at the bifurcation. Left ICA is normal. Vertebral arteries: The left vertebral artery is the dominant vessel. Both vertebral arteries originate from the subclavian arteries without significant stenosis. No significant stenosis is present in either vertebral artery in the neck. Skeleton: Mild degenerative changes of the cervical spine are most evident with uncovertebral spurring at C4-5 and C6-7. Vertebral body heights are maintained no significant listhesis is present. No focal lytic or blastic lesions are present. Other neck: Soft tissue mass posterior to the right submandibular gland extends to the hyoid. This appears separate from the carotid sheath. Lesion demonstrates no significant growth since 2018. No other focal soft tissue lesions  are present in neck. No significant adenopathy is present. Upper chest: The lung apices are clear. Thoracic inlet is within normal limits. Review of the MIP images confirms the above findings CTA HEAD FINDINGS Anterior circulation: The supraclinoid right ICA is narrowed to 1.5 mm, 50% stenosis relative to the more distal vessel. No significant stenosis is present on the left. ICA termini are within normal limits bilaterally. High-grade proximal left A1 segment stenosis is present. The anterior communicating artery is patent. Irregularity is present in the distal left M1 segment without significant stenosis. The MCA bifurcations are intact. Segmental narrowing is present in the ACA and MCA branch vessels bilaterally. High-grade stenosis is present in the right  pericallosal artery. Moderate stenosis is present in the mid right frontotemporal branch. Posterior circulation: Left vertebral artery is dominant. PICA origins are visualized and normal. Vertebrobasilar junction is normal. Both posterior cerebral arteries originate from the basilar tip. High-grade proximal P2 segment stenosis is present on the right. This results in marked attenuation of distal PCA branch vessels. Superior division occlusion is present. Moderate proximal left P2 segment stenosis is present. Distal PCA branch filling is better than on the right. Marked segmental attenuation is present in the distal branches. Venous sinuses: The dural sinuses are patent. The straight sinus deep cerebral veins are intact. Cortical veins are within normal limits. No significant vascular malformation is evident. Anatomic variants: None Review of the MIP images confirms the above findings IMPRESSION: 1. Proximal right P2 segment high-grade stenosis with marked attenuation of distal right PCA branch vessels. This corresponds to the right PCA territory acute/subacute infarcts. 2. Right P3 superior division superior division occlusion. 3. Moderate proximal left P2 segment stenosis. 4. High-grade proximal left A1 segment stenosis. 5. Moderate stenosis of the mid right frontotemporal branch, right M3. 6. Segmental narrowing of the ACA and MCA branch vessels bilaterally. 7. 50% stenosis of the supraclinoid right ICA. 8. Minimal atherosclerotic changes at the carotid bifurcations bilaterally without significant stenosis. 9. Soft tissue mass posterior to the right submandibular gland extends to the hyoid. This appears separate from the carotid sheath. There is no significant growth since 2018. 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 Paraganglioma is considered as well. MRI of the neck without and with contrast after the acute setting would be useful for further evaluation as clinically indicated.  Electronically Signed   By: San Morelle M.D.   On: 02/01/2020 05:19   DG Chest 2 View  Result Date: 01/31/2020 CLINICAL DATA:  Left-sided weakness, numbness and confusion. EXAM: CHEST - 2 VIEW COMPARISON:  March 07, 2017 FINDINGS: There is no evidence of acute infiltrate, pleural effusion or pneumothorax. A stable 7 mm focus of mildly increased opacification is seen overlying the upper right lung. The heart size and mediastinal contours are within normal limits. There is mild calcification of the aortic arch. The visualized skeletal structures are unremarkable. IMPRESSION: No active cardiopulmonary disease. Electronically Signed   By: Virgina Norfolk M.D.   On: 01/31/2020 23:23   CT ANGIO NECK W OR WO CONTRAST  Result Date: 02/01/2020 CLINICAL DATA:  Recent confusion beginning approximately 1 week ago. Right-sided numbness. Abnormal MRI with acute/subacute right PCA territory infarcts. EXAM: CT ANGIOGRAPHY HEAD AND NECK TECHNIQUE: Multidetector CT imaging of the head and neck was performed using the standard protocol during bolus administration of intravenous contrast. Multiplanar CT image reconstructions and MIPs were obtained to evaluate the vascular anatomy. Carotid stenosis measurements (when applicable) are obtained utilizing NASCET criteria, using  the distal internal carotid diameter as the denominator. CONTRAST:  41mL OMNIPAQUE IOHEXOL 350 MG/ML SOLN COMPARISON:  MR head without and with contrast 01/31/2020 FINDINGS: CT HEAD FINDINGS Brain: Medial right occipital lobe infarct is again noted. Advanced white matter disease is stable. No acute hemorrhage is present. The ventricles are of normal size. No significant extraaxial fluid collection is present. The brainstem and cerebellum are within normal limits. Vascular: No significant vascular calcifications are present. Skull: Calvarium is intact. No focal lytic or blastic lesions are present. No significant extracranial soft tissue lesion is  present. Sinuses: The paranasal sinuses and mastoid air cells are clear. Orbits: Bilateral lens replacements are noted. Globes and orbits are otherwise unremarkable. Review of the MIP images confirms the above findings CTA NECK FINDINGS Aortic arch: A 3 vessel arch configuration is present. Atherosclerotic changes are noted at the origins the great vessels and in the distal arch without significant stenosis or aneurysm. Right carotid system: The right common carotid artery is tortuous without focal stenosis. Atherosclerotic changes are noted at the bifurcation. Proximal right ICA is narrowed to 3 mm without significant stenosis relative to the more distal vessel. Distal right ICA is otherwise normal. Left carotid system: The left common carotid artery is tortuous without significant stenosis. Minimal atherosclerotic changes are present at the bifurcation. Left ICA is normal. Vertebral arteries: The left vertebral artery is the dominant vessel. Both vertebral arteries originate from the subclavian arteries without significant stenosis. No significant stenosis is present in either vertebral artery in the neck. Skeleton: Mild degenerative changes of the cervical spine are most evident with uncovertebral spurring at C4-5 and C6-7. Vertebral body heights are maintained no significant listhesis is present. No focal lytic or blastic lesions are present. Other neck: Soft tissue mass posterior to the right submandibular gland extends to the hyoid. This appears separate from the carotid sheath. Lesion demonstrates no significant growth since 2018. No other focal soft tissue lesions are present in neck. No significant adenopathy is present. Upper chest: The lung apices are clear. Thoracic inlet is within normal limits. Review of the MIP images confirms the above findings CTA HEAD FINDINGS Anterior circulation: The supraclinoid right ICA is narrowed to 1.5 mm, 50% stenosis relative to the more distal vessel. No significant  stenosis is present on the left. ICA termini are within normal limits bilaterally. High-grade proximal left A1 segment stenosis is present. The anterior communicating artery is patent. Irregularity is present in the distal left M1 segment without significant stenosis. The MCA bifurcations are intact. Segmental narrowing is present in the ACA and MCA branch vessels bilaterally. High-grade stenosis is present in the right pericallosal artery. Moderate stenosis is present in the mid right frontotemporal branch. Posterior circulation: Left vertebral artery is dominant. PICA origins are visualized and normal. Vertebrobasilar junction is normal. Both posterior cerebral arteries originate from the basilar tip. High-grade proximal P2 segment stenosis is present on the right. This results in marked attenuation of distal PCA branch vessels. Superior division occlusion is present. Moderate proximal left P2 segment stenosis is present. Distal PCA branch filling is better than on the right. Marked segmental attenuation is present in the distal branches. Venous sinuses: The dural sinuses are patent. The straight sinus deep cerebral veins are intact. Cortical veins are within normal limits. No significant vascular malformation is evident. Anatomic variants: None Review of the MIP images confirms the above findings IMPRESSION: 1. Proximal right P2 segment high-grade stenosis with marked attenuation of distal right PCA branch vessels. This corresponds  to the right PCA territory acute/subacute infarcts. 2. Right P3 superior division superior division occlusion. 3. Moderate proximal left P2 segment stenosis. 4. High-grade proximal left A1 segment stenosis. 5. Moderate stenosis of the mid right frontotemporal branch, right M3. 6. Segmental narrowing of the ACA and MCA branch vessels bilaterally. 7. 50% stenosis of the supraclinoid right ICA. 8. Minimal atherosclerotic changes at the carotid bifurcations bilaterally without significant  stenosis. 9. Soft tissue mass posterior to the right submandibular gland extends to the hyoid. This appears separate from the carotid sheath. There is no significant growth since 2018. 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 Paraganglioma is considered as well. MRI of the neck without and with contrast after the acute setting would be useful for further evaluation as clinically indicated. Electronically Signed   By: San Morelle M.D.   On: 02/01/2020 05:19   MR Brain W Wo Contrast  Result Date: 01/31/2020 CLINICAL DATA:  Confusion, blurry vision EXAM: MRI HEAD WITHOUT AND WITH CONTRAST TECHNIQUE: Multiplanar, multiecho pulse sequences of the brain and surrounding structures were obtained without and with intravenous contrast. CONTRAST:  80mL MULTIHANCE GADOBENATE DIMEGLUMINE 529 MG/ML IV SOLN COMPARISON:  None. FINDINGS: Brain: There is diffusion hyperintensity with variable ADC in the right occipital lobe. Additional involvement of the right perisplenial white matter and right caudothalamic groove. Corresponding enhancement is noted. There is some intrinsic T1 hyperintensity associated with the right occipital region, which may reflect laminar necrosis or blood products, noting some susceptibility as well. There is no definite abnormal diffusion signal associated with a small focus of enhancement of the right parietal lobe along parieto-occipital sulcus (series 14, image 88). Additional confluent areas of T2 hyperintensity in the supratentorial greater than pontine white matter are nonspecific but may reflect moderate to advanced chronic microvascular ischemic changes. There are chronic small vessel infarcts of the cerebellum, basal ganglia, and thalamus. There is no hydrocephalus or extra-axial fluid collection. Vascular: Major vessel flow voids at the skull base are preserved. Skull and upper cervical spine: Normal marrow signal is preserved. Sinuses/Orbits: Paranasal  sinuses are aerated. Bilateral lens replacements. Other: Sella is unremarkable.  Mastoid air cells are clear. IMPRESSION: Abnormal signal and enhancement involving the right occipital lobe, right perisplenial white matter, and right caudothalamic groove. Favored to reflect acute (occipital) and subacute infarcts. Moderate to advanced chronic microvascular ischemic changes. These results were called by telephone at the time of interpretation on 01/31/2020 at 2:56 pm to provider Scarlette Calico , who verbally acknowledged these results. Patient is to go to the emergency department for further evaluation. Electronically Signed   By: Macy Mis M.D.   On: 01/31/2020 15:07   EEG adult  Result Date: 02/01/2020 Lora Havens, MD     02/01/2020 12:40 PM Patient Name: Hargun Spurling MRN: [id number] Epilepsy Attending: Lora Havens Referring Physician/Provider: Dr. Kathrynn Speed Date: 02/01/2020 Duration: 24.33 mins Patient history: 86 year old female with multiple infarcts as well as intermittent paresthesias.  EEG to evaluate for seizures. Level of alertness: Awake, drowsy AEDs during EEG study: None Technical aspects: This EEG study was done with scalp electrodes positioned according to the 10-20 International system of electrode placement. Electrical activity was acquired at a sampling rate of 500Hz  and reviewed with a high frequency filter of 70Hz  and a low frequency filter of 1Hz . EEG data were recorded continuously and digitally stored. Description: The posterior dominant rhythm consists of 8 Hz activity of moderate voltage (25-35 uV) seen predominantly in posterior head regions,  symmetric and reactive to eye opening and eye closing. Drowsiness was characterized by attenuation of the posterior background rhythm. Physiology photic driving was not seen during photic stimulation.  Hyperventilation was not performed.   IMPRESSION: This study is within normal limits. No seizures or epileptiform discharges were  seen throughout the recording. Lora Havens   ECHOCARDIOGRAM COMPLETE  Result Date: 02/01/2020    ECHOCARDIOGRAM REPORT   Patient Name:   MINKA KNIGHT Date of Exam: 02/01/2020 Medical Rec #:  [id number]   Height:       66.0 in Accession #:    [id number]  Weight:       156.4 lb Date of Birth:  04-Apr-1932  BSA:          1.801 m Patient Age:    64 years    BP:           145/78 mmHg Patient Gender: F           HR:           65 bpm. Exam Location:  Inpatient Procedure: 2D Echo Indications:    Stroke 434.91 / I163.9  History:        Patient has no prior history of Echocardiogram examinations.                 Risk Factors:Hypertension and Dyslipidemia.  Sonographer:    Darlina Sicilian RDCS Referring Phys: [id number] [place]  1. Left ventricular ejection fraction, by estimation, is 65 to 70%. The left ventricle has normal function. The left ventricle has no regional wall motion abnormalities. Left ventricular diastolic parameters are consistent with Grade I diastolic dysfunction (impaired relaxation). Elevated left ventricular end-diastolic pressure.  2. Right ventricular systolic function is normal. The right ventricular size is normal. Tricuspid regurgitation signal is inadequate for assessing PA pressure.  3. The mitral valve is normal in structure. Trivial mitral valve regurgitation. No evidence of mitral stenosis.  4. The aortic valve was not well visualized. Aortic valve regurgitation is mild.  5. The inferior vena cava is normal in size with greater than 50% respiratory variability, suggesting right atrial pressure of 3 mmHg. FINDINGS  Left Ventricle: Left ventricular ejection fraction, by estimation, is 65 to 70%. The left ventricle has normal function. The left ventricle has no regional wall motion abnormalities. The left ventricular internal cavity size was normal in size. There is  no left ventricular hypertrophy. Left ventricular diastolic parameters are consistent with Grade I diastolic  dysfunction (impaired relaxation). Elevated left ventricular end-diastolic pressure. Right Ventricle: The right ventricular size is normal. No increase in right ventricular wall thickness. Right ventricular systolic function is normal. Tricuspid regurgitation signal is inadequate for assessing PA pressure. Left Atrium: Left atrial size was normal in size. Right Atrium: Right atrial size was normal in size. Pericardium: There is no evidence of pericardial effusion. Mitral Valve: The mitral valve is normal in structure. Normal mobility of the mitral valve leaflets. Trivial mitral valve regurgitation. No evidence of mitral valve stenosis. Tricuspid Valve: The tricuspid valve is normal in structure. Tricuspid valve regurgitation is trivial. No evidence of tricuspid stenosis. Aortic Valve: The aortic valve was not well visualized. Aortic valve regurgitation is mild. Pulmonic Valve: The pulmonic valve was normal in structure. Pulmonic valve regurgitation is not visualized. No evidence of pulmonic stenosis. Aorta: The aortic root was not well visualized and the ascending aorta was not well visualized. Venous: The inferior vena cava is normal in size with greater than 50% respiratory  variability, suggesting right atrial pressure of 3 mmHg. IAS/Shunts: No atrial level shunt detected by color flow Doppler.  LEFT VENTRICLE PLAX 2D LVIDd:         3.80 cm  Diastology LVIDs:         2.30 cm  LV e' lateral:   5.77 cm/s LV PW:         1.00 cm  LV E/e' lateral: 13.8 LV IVS:        1.10 cm  LV e' medial:    6.31 cm/s LVOT diam:     2.10 cm  LV E/e' medial:  12.6 LV SV:         52 LV SV Index:   29 LVOT Area:     3.46 cm  RIGHT VENTRICLE RV S prime:     16.10 cm/s TAPSE (M-mode): 1.8 cm LEFT ATRIUM             Index       RIGHT ATRIUM          Index LA diam:        3.10 cm 1.72 cm/m  RA Area:     7.13 cm LA Vol (A2C):   31.7 ml 17.60 ml/m RA Volume:   9.63 ml  5.35 ml/m LA Vol (A4C):   32.7 ml 18.15 ml/m LA Biplane Vol: 33.5 ml  18.60 ml/m  AORTIC VALVE LVOT Vmax:   69.00 cm/s LVOT Vmean:  46.500 cm/s LVOT VTI:    0.151 m  AORTA Ao Root diam: 3.20 cm MITRAL VALVE MV Area (PHT): 3.06 cm     SHUNTS MV Decel Time: 248 msec     Systemic VTI:  0.15 m MV E velocity: 79.60 cm/s   Systemic Diam: 2.10 cm MV A velocity: 120.00 cm/s MV E/A ratio:  0.66 Fransico Him MD Electronically signed by Fransico Him MD Signature Date/Time: 02/01/2020/1:50:56 PM    Final     Assessment & Plan:   Evy was seen today for urinary frequency.  Diagnoses and all orders for this visit:  Dysuria- Based on her symptoms, exam, negative urine culture I think she has overactive bladder.  I recommended that she treat this with solifenacin. -     POCT Urinalysis Dipstick (Automated) -     Urine Culture; Future -     Urine Culture -     CULTURE, URINE COMPREHENSIVE; Future  Vitamin B12 deficiency neuropathy (HCC) -     cyanocobalamin ((VITAMIN B-12)) injection 1,000 mcg  OAB (overactive bladder) -     Discontinue: Vibegron (GEMTESA) 75 MG TABS; Take 1 tablet by mouth daily. -     solifenacin (VESICARE) 10 MG tablet; Take 1 tablet (10 mg total) by mouth daily.  Other abnormal glucose- her blood sugar is normal. -     POCT Glucose (Device for Home Use)   I have discontinued Kajuana Sees's vitamin B-12 and Gemtesa. I am also having her start on solifenacin. Additionally, I am having her maintain her DULoxetine, atorvastatin, and clopidogrel. We will continue to administer cyanocobalamin.  Meds ordered this encounter  Medications  . cyanocobalamin ((VITAMIN B-12)) injection 1,000 mcg  . DISCONTD: Vibegron (GEMTESA) 75 MG TABS    Sig: Take 1 tablet by mouth daily.    Dispense:  90 tablet    Refill:  1  . solifenacin (VESICARE) 10 MG tablet    Sig: Take 1 tablet (10 mg total) by mouth daily.    Dispense:  90 tablet  Refill:  1     Follow-up: Return if symptoms worsen or fail to improve.  Scarlette Calico, MD

## 2020-02-23 NOTE — Patient Instructions (Signed)

## 2020-02-24 DIAGNOSIS — F418 Other specified anxiety disorders: Secondary | ICD-10-CM | POA: Diagnosis not present

## 2020-02-24 DIAGNOSIS — N390 Urinary tract infection, site not specified: Secondary | ICD-10-CM | POA: Diagnosis not present

## 2020-02-24 DIAGNOSIS — I10 Essential (primary) hypertension: Secondary | ICD-10-CM | POA: Diagnosis not present

## 2020-02-24 DIAGNOSIS — I69354 Hemiplegia and hemiparesis following cerebral infarction affecting left non-dominant side: Secondary | ICD-10-CM | POA: Diagnosis not present

## 2020-02-24 DIAGNOSIS — I69322 Dysarthria following cerebral infarction: Secondary | ICD-10-CM | POA: Diagnosis not present

## 2020-02-24 DIAGNOSIS — E785 Hyperlipidemia, unspecified: Secondary | ICD-10-CM | POA: Diagnosis not present

## 2020-02-24 LAB — URINE CULTURE: Result:: NO GROWTH

## 2020-02-26 ENCOUNTER — Other Ambulatory Visit: Payer: BLUE CROSS/BLUE SHIELD

## 2020-02-28 ENCOUNTER — Telehealth: Payer: Self-pay

## 2020-02-28 DIAGNOSIS — I63531 Cerebral infarction due to unspecified occlusion or stenosis of right posterior cerebral artery: Secondary | ICD-10-CM

## 2020-02-28 DIAGNOSIS — I10 Essential (primary) hypertension: Secondary | ICD-10-CM | POA: Diagnosis not present

## 2020-02-28 DIAGNOSIS — F418 Other specified anxiety disorders: Secondary | ICD-10-CM | POA: Diagnosis not present

## 2020-02-28 DIAGNOSIS — N390 Urinary tract infection, site not specified: Secondary | ICD-10-CM | POA: Diagnosis not present

## 2020-02-28 DIAGNOSIS — E785 Hyperlipidemia, unspecified: Secondary | ICD-10-CM | POA: Diagnosis not present

## 2020-02-28 DIAGNOSIS — I69354 Hemiplegia and hemiparesis following cerebral infarction affecting left non-dominant side: Secondary | ICD-10-CM | POA: Diagnosis not present

## 2020-02-28 DIAGNOSIS — I69322 Dysarthria following cerebral infarction: Secondary | ICD-10-CM | POA: Diagnosis not present

## 2020-02-28 NOTE — Telephone Encounter (Signed)
Pt dtr is requesting an rx for a small rollator seated walker to be sent to [facility].   Rx printed for PCP to sign.

## 2020-02-29 DIAGNOSIS — Z7902 Long term (current) use of antithrombotics/antiplatelets: Secondary | ICD-10-CM | POA: Diagnosis not present

## 2020-02-29 DIAGNOSIS — Z87440 Personal history of urinary (tract) infections: Secondary | ICD-10-CM

## 2020-02-29 DIAGNOSIS — I69354 Hemiplegia and hemiparesis following cerebral infarction affecting left non-dominant side: Secondary | ICD-10-CM | POA: Diagnosis not present

## 2020-02-29 DIAGNOSIS — Z9181 History of falling: Secondary | ICD-10-CM

## 2020-02-29 DIAGNOSIS — F418 Other specified anxiety disorders: Secondary | ICD-10-CM

## 2020-02-29 DIAGNOSIS — N390 Urinary tract infection, site not specified: Secondary | ICD-10-CM | POA: Diagnosis not present

## 2020-02-29 DIAGNOSIS — E538 Deficiency of other specified B group vitamins: Secondary | ICD-10-CM

## 2020-02-29 DIAGNOSIS — Z7982 Long term (current) use of aspirin: Secondary | ICD-10-CM | POA: Diagnosis not present

## 2020-02-29 DIAGNOSIS — I10 Essential (primary) hypertension: Secondary | ICD-10-CM | POA: Diagnosis not present

## 2020-02-29 DIAGNOSIS — I69322 Dysarthria following cerebral infarction: Secondary | ICD-10-CM | POA: Diagnosis not present

## 2020-02-29 DIAGNOSIS — D3702 Neoplasm of uncertain behavior of tongue: Secondary | ICD-10-CM | POA: Diagnosis not present

## 2020-03-01 ENCOUNTER — Ambulatory Visit (INDEPENDENT_AMBULATORY_CARE_PROVIDER_SITE_OTHER): Payer: Medicare Other | Admitting: Adult Health

## 2020-03-01 ENCOUNTER — Encounter: Payer: Self-pay | Admitting: Adult Health

## 2020-03-01 VITALS — BP 130/70 | HR 90 | Ht 65.0 in | Wt 154.0 lb

## 2020-03-01 DIAGNOSIS — I6523 Occlusion and stenosis of bilateral carotid arteries: Secondary | ICD-10-CM

## 2020-03-01 DIAGNOSIS — I69319 Unspecified symptoms and signs involving cognitive functions following cerebral infarction: Secondary | ICD-10-CM

## 2020-03-01 DIAGNOSIS — I10 Essential (primary) hypertension: Secondary | ICD-10-CM | POA: Diagnosis not present

## 2020-03-01 DIAGNOSIS — E785 Hyperlipidemia, unspecified: Secondary | ICD-10-CM

## 2020-03-01 DIAGNOSIS — Z789 Other specified health status: Secondary | ICD-10-CM

## 2020-03-01 DIAGNOSIS — I63531 Cerebral infarction due to unspecified occlusion or stenosis of right posterior cerebral artery: Secondary | ICD-10-CM

## 2020-03-01 MED ORDER — ASPIRIN EC 81 MG PO TBEC
81.0000 mg | DELAYED_RELEASE_TABLET | Freq: Every day | ORAL | 0 refills | Status: AC
Start: 2020-03-01 — End: 2020-04-30

## 2020-03-01 NOTE — Patient Instructions (Signed)
Referral placed to outpatient neuro rehab occupational and speech therapy for further recovery and hopeful return to living independently.  Recommend calling office next week if you do not hear from them prior  Continue clopidogrel 75 mg daily and recommend restarting aspirin 81 mg daily for additional 2 months as 3 months of aspirin and Plavix combination duration recommended.  After 2 months, stop aspirin and continue on Plavix alone  Continue atorvastatin for cholesterol management  Continue to follow up with PCP regarding cholesterol and blood pressure management   Continue to monitor blood pressure at home  Maintain strict control of hypertension with blood pressure goal below 130/90 and cholesterol with LDL cholesterol (bad cholesterol) goal below 70 mg/dL. I also advised the patient to eat a healthy diet with plenty of whole grains, cereals, fruits and vegetables, exercise regularly and maintain ideal body weight.  Followup in the future with me in 3 months or call earlier if needed       Thank you for coming to see [place] at [facility]. I hope we have been able to provide you high quality care today.  You may receive a patient satisfaction survey over the next few weeks. We would appreciate your feedback and comments so that we may continue to improve ourselves and the health of our patients.

## 2020-03-01 NOTE — Progress Notes (Signed)
[facility] [address] [place] [place] [address] [phone]       [place]. Kayton Ripp Date of Birth:  03-12-1932 Medical Record Number:  [id number]   Reason for Referral:  hospital stroke follow up    SUBJECTIVE:   CHIEF COMPLAINT:  Chief Complaint  Patient presents with  . Follow-up    tx rm pt here for a stoke f/u. Pt is having no new sx. Pt is withher grand daughter Eliezer Lofts    HPI:   Hospital summary Personally reviewed all hospital notes and pertinent lab work and imaging Ms. Earl Zellmer is a 86 y.o. female with history of HTN, HLD  who presented on 01/31/2020 with waxing and waning confusion, left-sided visual loss and R side numbness.  Stroke work-up revealed right PCA infarct in setting of right PCA high-grade stenosis, infarct secondary to large vessel disease source.  CTA head/neck diffuse intracranial stenosis (additional information below).  Recommended DAPT for 3 months then Plavix alone as previously on aspirin.  HTN stable.  LDL 123 initiate atorvastatin 40 mg daily.  Other stroke risk factors include advanced age, former tobacco use, EtOH use, family history of stroke but no personal history of stroke.  Evaluated by therapy and recommended outpatient PT and discharged home in stable condition.  Stroke:   R PCA infarct in setting of R PCA high-grade stenosis, infarcts secondary to large vessel disease source  MRI  Acute and subacute R occipital infarcts. Moderate small vessel disease.   CTA head & neck proximal R P2 high-grade stenosis associated w/ infarct. R P3 superior division occlusion. Moderate L P2 stenosis. L A1 high-grade stenosis. Moderate mid R frontoparietal branch stenosis RM3. B narrowing ACA and MCA branches. Supraclinoid R ICA 50%. B ICA bifurcation atherosclerosis. 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 No source of embolus   LDL 123  HgbA1c  pending   aspirin 81 mg daily prior to admission, now on aspirin 81 mg daily and clopidogrel 75 mg daily. Continue DAPT x 3 months then plavix alone  Therapy recommendations:  OP PT  Disposition:  D/c home  Today, 03/01/2020, Ms. Darsey is being seen for hospital follow-up accompanied by her granddaughter [place].  Residual deficits of mild imbalance and mild cognitive impairment which has been improving.  Currently working with home health therapy which is to be completed after additional 2 sessions.  She was evaluated by OT or SLP.  Previously living independently but currently staying with family due to safety concerns in regards to ambulation, performing ADLs and IADLs and cognitive impairment.  Currently only on Plavix and discontinued aspirin after 3 weeks (per granddaughter, this was advised at hospital discharge).  Denies bleeding or bruising currently or while on DAPT.  Continues on atorvastatin without myalgias.  Blood pressure today 130/70.  No other concerns at this time.     ROS:   14 system review of systems performed and negative with exception of memory loss, gait impairment  PMH:  Past Medical History:  Diagnosis Date  . HTN (hypertension)   . Hyperlipidemia   . Urinary incontinence     PSH:  Past Surgical History:  Procedure Laterality Date  . ABDOMINAL HYSTERECTOMY    . TONSILLECTOMY      Social History:  Social History   Socioeconomic History  . Marital status: Widowed    Spouse name: Not on file  . Number of children:  1  . Years of education: Not on file  . Highest education level: Not on file  Occupational History  . Occupation: Retired  Tobacco Use  . Smoking status: Former Research scientist (life sciences)  . Smokeless tobacco: Former Systems developer    Quit date: 03/17/2017  . Tobacco comment: STATES SHE QUIT 25 YEARS AGO  Vaping Use  . Vaping Use: Never used  Substance and Sexual Activity  . Alcohol use: Yes    Alcohol/week: 14.0 standard drinks    Types: 7 Glasses of wine, 7 Cans  of beer per week  . Drug use: No  . Sexual activity: Never    Birth control/protection: Surgical  Other Topics Concern  . Not on file  Social History Narrative   Regular Exercise-no         Social Determinants of Health   Financial Resource Strain: Low Risk   . Difficulty of Paying Living Expenses: Not hard at all  Food Insecurity: No Food Insecurity  . Worried About Charity fundraiser in the Last Year: Never true  . Ran Out of Food in the Last Year: Never true  Transportation Needs: No Transportation Needs  . Lack of Transportation (Medical): No  . Lack of Transportation (Non-Medical): No  Physical Activity:   . Days of Exercise per Week:   . Minutes of Exercise per Session:   Stress: No Stress Concern Present  . Feeling of Stress : Not at all  Social Connections: Unknown  . Frequency of Communication with Friends and Family: More than three times a week  . Frequency of Social Gatherings with Friends and Family: More than three times a week  . Attends Religious Services: Not on file  . Active Member of Clubs or Organizations: Not on file  . Attends Archivist Meetings: Not on file  . Marital Status: Widowed  Intimate Partner Violence:   . Fear of Current or Ex-Partner:   . Emotionally Abused:   Marland Kitchen Physically Abused:   . Sexually Abused:     Family History:  Family History  Problem Relation Age of Onset  . Diabetes Other        1st degree relative  . Hyperlipidemia Other   . Stroke Other        1st degree Female relative <50  . Cancer Neg Hx   . Early death Neg Hx   . Heart disease Neg Hx   . Hypertension Neg Hx   . Kidney disease Neg Hx     Medications:   Current Outpatient Medications on File Prior to Visit  Medication Sig Dispense Refill  . atorvastatin (LIPITOR) 40 MG tablet Take 1 tablet (40 mg total) by mouth daily. 90 tablet 0  . clopidogrel (PLAVIX) 75 MG tablet Take 1 tablet (75 mg total) by mouth daily. 90 tablet 0  . DULoxetine (CYMBALTA)  30 MG capsule Take 1 capsule (30 mg total) by mouth daily. 90 capsule 0  . solifenacin (VESICARE) 10 MG tablet Take 1 tablet (10 mg total) by mouth daily. 90 tablet 1   Current Facility-Administered Medications on File Prior to Visit  Medication Dose Route Frequency Provider Last Rate Last Admin  . [START ON 03/06/2020] cyanocobalamin ((VITAMIN B-12)) injection 1,000 mcg  1,000 mcg Intramuscular Q30 days Janith Lima, MD        Allergies:   Allergies  Allergen Reactions  . Codeine Itching and Nausea And Vomiting  . Tape Rash    Tears the skin  OBJECTIVE:  Physical Exam  Vitals:   03/01/20 1051  BP: 130/70  Pulse: 90  Weight: 154 lb (69.9 kg)  Height: 5\' 5"  (1.651 m)   Body mass index is 25.63 kg/m. No exam data present   General: well developed, well nourished,  pleasant elderly Caucasian female, seated, in no evident distress Head: head normocephalic and atraumatic.   Neck: supple with no carotid or supraclavicular bruits Cardiovascular: regular rate and rhythm, no murmurs Musculoskeletal: no deformity Skin:  no rash/petichiae Vascular:  Normal pulses all extremities   Neurologic Exam Mental Status: Awake and fully alert. :  Speech and language.  Oriented to place and time. Recent memory impaired and remote memory intact. Attention span, concentration and fund of knowledge appropriate during visit with granddaughter providing some history. Mood and affect appropriate.  Cranial Nerves: Fundoscopic exam reveals sharp disc margins. Pupils equal, briskly reactive to light. Extraocular movements full without nystagmus. Visual fields full to confrontation. Hearing intact. Facial sensation intact. Face, tongue, palate moves normally and symmetrically.  Motor: Normal bulk and tone. Normal strength in all tested extremity muscles except slight decreased left hand dexterity. Sensory.: intact to touch , pinprick , position and vibratory sensation.  Coordination: Rapid  alternating movements normal in all extremities except slightly decreased left hand. Finger-to-nose and heel-to-shin performed accurately bilaterally. Gait and Station: Arises from chair without difficulty. Stance is normal. Gait demonstrates normal stride length and mild to moderate imbalance without use of assistive device Reflexes: 1+ and symmetric. Toes downgoing.     NIHSS  0 Modified Rankin  3      ASSESSMENT: Sheelah Ritacco is a 86 y.o. year old female presented with waxing/waning confusion, right-sided numbness and left-sided visual impairment on 01/31/2020 with stroke work-up revealing R PCA infarct in setting of R PCA high-grade stenosis, infarcts secondary to large vessel disease source. Vascular risk factors include HTN, HLD, diffuse intracranial stenosis, former tobacco use, advanced age and EtOH use.      PLAN:  1. R PCA stroke: Residual deficits: Mild cognitive impairment, decreased left hand dexterity and imbalance.  Referral placed to outpatient OT and SLP to start once [facility] PT completed after additional 2 sessions.  Recommended to continue to live with family and no driving until further recovery and eval with therapies.  Continue clopidogrel 75 mg daily and restart aspirin for additional 2 months and atorvastatin 40 mg daily for secondary stroke prevention.  Complete 3 months DAPT and then Plavix alone.  Close PCP follow-up for aggressive stroke risk factor management. 2. Intracranial stenosis: Currently asymptomatic.  Continue medication management and aggressive stroke risk factor management.  Complete 3 months DAPT and Plavix alone as well as ongoing statin use.  No indication at this time for repeat imaging unless becomes symptomatic. 3. HTN: BP goal<130/90.  Stable today. f/u with PCP 4. HLD: LDL goal<70.  Recent LDL 123.  Continue atorvastatin 40 mg daily and she will follow up with PCP for repeat lipid panel, prescribing of atorvastatin and ongoing monitoring and  management     Follow up in 3 months or call earlier if needed   I spent 45 minutes of face-to-face and non-face-to-face time with patient and granddaughter.  This included previsit chart review, lab review, study review, order entry, electronic health record documentation, patient education regarding recent stroke, residual deficits, importance of managing stroke risk factors and answered all questions to patient satisfaction   Frann Rider, AGNP-BC  [facility] [address] [place], [place]  [address]  Phone [phone] Fax [phone] Note: This document was prepared with digital dictation and possible smart phrase technology. Any transcriptional errors that result from this process are unintentional.

## 2020-03-02 DIAGNOSIS — I69354 Hemiplegia and hemiparesis following cerebral infarction affecting left non-dominant side: Secondary | ICD-10-CM | POA: Diagnosis not present

## 2020-03-02 DIAGNOSIS — F418 Other specified anxiety disorders: Secondary | ICD-10-CM | POA: Diagnosis not present

## 2020-03-02 DIAGNOSIS — I69322 Dysarthria following cerebral infarction: Secondary | ICD-10-CM | POA: Diagnosis not present

## 2020-03-02 DIAGNOSIS — N390 Urinary tract infection, site not specified: Secondary | ICD-10-CM | POA: Diagnosis not present

## 2020-03-02 DIAGNOSIS — I10 Essential (primary) hypertension: Secondary | ICD-10-CM | POA: Diagnosis not present

## 2020-03-02 DIAGNOSIS — E785 Hyperlipidemia, unspecified: Secondary | ICD-10-CM | POA: Diagnosis not present

## 2020-03-06 DIAGNOSIS — I69322 Dysarthria following cerebral infarction: Secondary | ICD-10-CM | POA: Diagnosis not present

## 2020-03-06 DIAGNOSIS — F418 Other specified anxiety disorders: Secondary | ICD-10-CM | POA: Diagnosis not present

## 2020-03-06 DIAGNOSIS — N390 Urinary tract infection, site not specified: Secondary | ICD-10-CM | POA: Diagnosis not present

## 2020-03-06 DIAGNOSIS — I10 Essential (primary) hypertension: Secondary | ICD-10-CM | POA: Diagnosis not present

## 2020-03-06 DIAGNOSIS — E785 Hyperlipidemia, unspecified: Secondary | ICD-10-CM | POA: Diagnosis not present

## 2020-03-06 DIAGNOSIS — I69354 Hemiplegia and hemiparesis following cerebral infarction affecting left non-dominant side: Secondary | ICD-10-CM | POA: Diagnosis not present

## 2020-03-08 DIAGNOSIS — E785 Hyperlipidemia, unspecified: Secondary | ICD-10-CM | POA: Diagnosis not present

## 2020-03-08 DIAGNOSIS — I69322 Dysarthria following cerebral infarction: Secondary | ICD-10-CM | POA: Diagnosis not present

## 2020-03-08 DIAGNOSIS — F418 Other specified anxiety disorders: Secondary | ICD-10-CM | POA: Diagnosis not present

## 2020-03-08 DIAGNOSIS — I10 Essential (primary) hypertension: Secondary | ICD-10-CM | POA: Diagnosis not present

## 2020-03-08 DIAGNOSIS — N390 Urinary tract infection, site not specified: Secondary | ICD-10-CM | POA: Diagnosis not present

## 2020-03-08 DIAGNOSIS — I69354 Hemiplegia and hemiparesis following cerebral infarction affecting left non-dominant side: Secondary | ICD-10-CM | POA: Diagnosis not present

## 2020-03-09 ENCOUNTER — Ambulatory Visit: Payer: Medicare Other

## 2020-03-10 ENCOUNTER — Ambulatory Visit (INDEPENDENT_AMBULATORY_CARE_PROVIDER_SITE_OTHER): Payer: Medicare Other | Admitting: *Deleted

## 2020-03-10 ENCOUNTER — Other Ambulatory Visit: Payer: Self-pay

## 2020-03-10 DIAGNOSIS — E538 Deficiency of other specified B group vitamins: Secondary | ICD-10-CM | POA: Diagnosis not present

## 2020-03-10 DIAGNOSIS — G63 Polyneuropathy in diseases classified elsewhere: Secondary | ICD-10-CM | POA: Diagnosis not present

## 2020-03-10 MED ORDER — CYANOCOBALAMIN 1000 MCG/ML IJ SOLN
1000.0000 ug | Freq: Once | INTRAMUSCULAR | Status: AC
  Administered 2020-03-10: 1000 ug via INTRAMUSCULAR

## 2020-03-10 NOTE — Progress Notes (Signed)
Pls cosign for B12 inj in abscence of PCP/lmb

## 2020-03-13 DIAGNOSIS — I69322 Dysarthria following cerebral infarction: Secondary | ICD-10-CM | POA: Diagnosis not present

## 2020-03-13 DIAGNOSIS — N390 Urinary tract infection, site not specified: Secondary | ICD-10-CM | POA: Diagnosis not present

## 2020-03-13 DIAGNOSIS — E785 Hyperlipidemia, unspecified: Secondary | ICD-10-CM | POA: Diagnosis not present

## 2020-03-13 DIAGNOSIS — I69354 Hemiplegia and hemiparesis following cerebral infarction affecting left non-dominant side: Secondary | ICD-10-CM | POA: Diagnosis not present

## 2020-03-13 DIAGNOSIS — F418 Other specified anxiety disorders: Secondary | ICD-10-CM | POA: Diagnosis not present

## 2020-03-13 DIAGNOSIS — I10 Essential (primary) hypertension: Secondary | ICD-10-CM | POA: Diagnosis not present

## 2020-03-15 DIAGNOSIS — Z7982 Long term (current) use of aspirin: Secondary | ICD-10-CM | POA: Diagnosis not present

## 2020-03-15 DIAGNOSIS — D37032 Neoplasm of uncertain behavior of the submandibular salivary glands: Secondary | ICD-10-CM | POA: Diagnosis not present

## 2020-03-15 DIAGNOSIS — I69354 Hemiplegia and hemiparesis following cerebral infarction affecting left non-dominant side: Secondary | ICD-10-CM | POA: Diagnosis not present

## 2020-03-15 DIAGNOSIS — I69322 Dysarthria following cerebral infarction: Secondary | ICD-10-CM | POA: Diagnosis not present

## 2020-03-15 DIAGNOSIS — Z9181 History of falling: Secondary | ICD-10-CM | POA: Diagnosis not present

## 2020-03-15 DIAGNOSIS — Z7902 Long term (current) use of antithrombotics/antiplatelets: Secondary | ICD-10-CM | POA: Diagnosis not present

## 2020-03-15 DIAGNOSIS — I10 Essential (primary) hypertension: Secondary | ICD-10-CM | POA: Diagnosis not present

## 2020-03-15 DIAGNOSIS — N390 Urinary tract infection, site not specified: Secondary | ICD-10-CM | POA: Diagnosis not present

## 2020-03-15 DIAGNOSIS — E785 Hyperlipidemia, unspecified: Secondary | ICD-10-CM | POA: Diagnosis not present

## 2020-03-15 DIAGNOSIS — Z87440 Personal history of urinary (tract) infections: Secondary | ICD-10-CM | POA: Diagnosis not present

## 2020-03-15 DIAGNOSIS — E538 Deficiency of other specified B group vitamins: Secondary | ICD-10-CM | POA: Diagnosis not present

## 2020-03-15 DIAGNOSIS — F418 Other specified anxiety disorders: Secondary | ICD-10-CM | POA: Diagnosis not present

## 2020-03-17 ENCOUNTER — Telehealth: Payer: Self-pay | Admitting: Internal Medicine

## 2020-03-17 DIAGNOSIS — I10 Essential (primary) hypertension: Secondary | ICD-10-CM | POA: Diagnosis not present

## 2020-03-17 DIAGNOSIS — N390 Urinary tract infection, site not specified: Secondary | ICD-10-CM | POA: Diagnosis not present

## 2020-03-17 DIAGNOSIS — E785 Hyperlipidemia, unspecified: Secondary | ICD-10-CM | POA: Diagnosis not present

## 2020-03-17 DIAGNOSIS — I69354 Hemiplegia and hemiparesis following cerebral infarction affecting left non-dominant side: Secondary | ICD-10-CM | POA: Diagnosis not present

## 2020-03-17 DIAGNOSIS — I69322 Dysarthria following cerebral infarction: Secondary | ICD-10-CM | POA: Diagnosis not present

## 2020-03-17 DIAGNOSIS — F418 Other specified anxiety disorders: Secondary | ICD-10-CM | POA: Diagnosis not present

## 2020-03-17 NOTE — Telephone Encounter (Signed)
   Jim from [facility] calling to report patient BP for today Asymptomatic 160/90 and 140/90

## 2020-03-17 NOTE — Telephone Encounter (Signed)
Rx has been faxed to [facility].

## 2020-03-20 ENCOUNTER — Telehealth: Payer: Self-pay | Admitting: Adult Health

## 2020-03-20 NOTE — Telephone Encounter (Signed)
Physical Therapist with [facility] left a voicemail stating he has been informed that pt was in need of Occupational and Speech therapy.  If there is a need for [name] left a fax # that the referral could be be sent to [phone]

## 2020-03-21 DIAGNOSIS — I10 Essential (primary) hypertension: Secondary | ICD-10-CM | POA: Diagnosis not present

## 2020-03-21 DIAGNOSIS — E785 Hyperlipidemia, unspecified: Secondary | ICD-10-CM | POA: Diagnosis not present

## 2020-03-21 DIAGNOSIS — I69354 Hemiplegia and hemiparesis following cerebral infarction affecting left non-dominant side: Secondary | ICD-10-CM | POA: Diagnosis not present

## 2020-03-21 DIAGNOSIS — I69322 Dysarthria following cerebral infarction: Secondary | ICD-10-CM | POA: Diagnosis not present

## 2020-03-21 DIAGNOSIS — F418 Other specified anxiety disorders: Secondary | ICD-10-CM | POA: Diagnosis not present

## 2020-03-21 DIAGNOSIS — N390 Urinary tract infection, site not specified: Secondary | ICD-10-CM | POA: Diagnosis not present

## 2020-03-23 DIAGNOSIS — I10 Essential (primary) hypertension: Secondary | ICD-10-CM | POA: Diagnosis not present

## 2020-03-23 DIAGNOSIS — N390 Urinary tract infection, site not specified: Secondary | ICD-10-CM | POA: Diagnosis not present

## 2020-03-23 DIAGNOSIS — E785 Hyperlipidemia, unspecified: Secondary | ICD-10-CM | POA: Diagnosis not present

## 2020-03-23 DIAGNOSIS — F418 Other specified anxiety disorders: Secondary | ICD-10-CM | POA: Diagnosis not present

## 2020-03-23 DIAGNOSIS — I69354 Hemiplegia and hemiparesis following cerebral infarction affecting left non-dominant side: Secondary | ICD-10-CM | POA: Diagnosis not present

## 2020-03-23 DIAGNOSIS — I69322 Dysarthria following cerebral infarction: Secondary | ICD-10-CM | POA: Diagnosis not present

## 2020-03-23 NOTE — Telephone Encounter (Signed)
Noted I faxed Orders to [phone]

## 2020-03-29 DIAGNOSIS — F418 Other specified anxiety disorders: Secondary | ICD-10-CM | POA: Diagnosis not present

## 2020-03-29 DIAGNOSIS — I10 Essential (primary) hypertension: Secondary | ICD-10-CM | POA: Diagnosis not present

## 2020-03-29 DIAGNOSIS — E785 Hyperlipidemia, unspecified: Secondary | ICD-10-CM | POA: Diagnosis not present

## 2020-03-29 DIAGNOSIS — I69354 Hemiplegia and hemiparesis following cerebral infarction affecting left non-dominant side: Secondary | ICD-10-CM | POA: Diagnosis not present

## 2020-03-29 DIAGNOSIS — N390 Urinary tract infection, site not specified: Secondary | ICD-10-CM | POA: Diagnosis not present

## 2020-03-29 DIAGNOSIS — I69322 Dysarthria following cerebral infarction: Secondary | ICD-10-CM | POA: Diagnosis not present

## 2020-04-05 DIAGNOSIS — I69354 Hemiplegia and hemiparesis following cerebral infarction affecting left non-dominant side: Secondary | ICD-10-CM | POA: Diagnosis not present

## 2020-04-05 DIAGNOSIS — N390 Urinary tract infection, site not specified: Secondary | ICD-10-CM | POA: Diagnosis not present

## 2020-04-05 DIAGNOSIS — E785 Hyperlipidemia, unspecified: Secondary | ICD-10-CM | POA: Diagnosis not present

## 2020-04-05 DIAGNOSIS — F418 Other specified anxiety disorders: Secondary | ICD-10-CM | POA: Diagnosis not present

## 2020-04-05 DIAGNOSIS — I10 Essential (primary) hypertension: Secondary | ICD-10-CM | POA: Diagnosis not present

## 2020-04-05 DIAGNOSIS — I69322 Dysarthria following cerebral infarction: Secondary | ICD-10-CM | POA: Diagnosis not present

## 2020-04-10 ENCOUNTER — Ambulatory Visit: Payer: Medicare Other

## 2020-04-11 DIAGNOSIS — I10 Essential (primary) hypertension: Secondary | ICD-10-CM | POA: Diagnosis not present

## 2020-04-11 DIAGNOSIS — N390 Urinary tract infection, site not specified: Secondary | ICD-10-CM | POA: Diagnosis not present

## 2020-04-11 DIAGNOSIS — E785 Hyperlipidemia, unspecified: Secondary | ICD-10-CM | POA: Diagnosis not present

## 2020-04-11 DIAGNOSIS — I69354 Hemiplegia and hemiparesis following cerebral infarction affecting left non-dominant side: Secondary | ICD-10-CM | POA: Diagnosis not present

## 2020-04-11 DIAGNOSIS — F418 Other specified anxiety disorders: Secondary | ICD-10-CM | POA: Diagnosis not present

## 2020-04-11 DIAGNOSIS — I69322 Dysarthria following cerebral infarction: Secondary | ICD-10-CM | POA: Diagnosis not present

## 2020-04-13 ENCOUNTER — Ambulatory Visit (INDEPENDENT_AMBULATORY_CARE_PROVIDER_SITE_OTHER): Payer: Medicare Other

## 2020-04-13 ENCOUNTER — Other Ambulatory Visit: Payer: Self-pay

## 2020-04-13 DIAGNOSIS — E538 Deficiency of other specified B group vitamins: Secondary | ICD-10-CM

## 2020-04-13 DIAGNOSIS — G63 Polyneuropathy in diseases classified elsewhere: Secondary | ICD-10-CM | POA: Diagnosis not present

## 2020-04-13 MED ORDER — CYANOCOBALAMIN 1000 MCG/ML IJ SOLN
1000.0000 ug | Freq: Once | INTRAMUSCULAR | Status: AC
  Administered 2020-04-13: 1000 ug via INTRAMUSCULAR

## 2020-04-13 NOTE — Progress Notes (Addendum)
I have reviewed and agree  Pt here for monthly B12 injection per B12 1045mcg Right IM and patient tolerated injection well. Nex B12 injection scheduled for October 4th, 2021.

## 2020-04-20 ENCOUNTER — Other Ambulatory Visit: Payer: Self-pay | Admitting: Internal Medicine

## 2020-04-20 DIAGNOSIS — F322 Major depressive disorder, single episode, severe without psychotic features: Secondary | ICD-10-CM

## 2020-04-28 ENCOUNTER — Encounter: Payer: Self-pay | Admitting: Internal Medicine

## 2020-04-28 MED ORDER — CLOPIDOGREL BISULFATE 75 MG PO TABS: 75.0000 mg | ORAL_TABLET | Freq: Every day | ORAL | 0 refills | Status: DC

## 2020-04-28 MED ORDER — ATORVASTATIN CALCIUM 40 MG PO TABS: 40.0000 mg | ORAL_TABLET | Freq: Every day | ORAL | 0 refills | Status: DC

## 2020-05-15 ENCOUNTER — Other Ambulatory Visit: Payer: Self-pay

## 2020-05-15 ENCOUNTER — Ambulatory Visit (INDEPENDENT_AMBULATORY_CARE_PROVIDER_SITE_OTHER): Payer: Medicare Other

## 2020-05-15 DIAGNOSIS — Z23 Encounter for immunization: Secondary | ICD-10-CM

## 2020-05-15 DIAGNOSIS — E538 Deficiency of other specified B group vitamins: Secondary | ICD-10-CM | POA: Diagnosis not present

## 2020-05-15 DIAGNOSIS — G63 Polyneuropathy in diseases classified elsewhere: Secondary | ICD-10-CM | POA: Diagnosis not present

## 2020-05-15 NOTE — Progress Notes (Signed)
Pt here for monthly B12 injection per Dr Ronnald Ramp.  B12 1047mcg given IM left deltoid and pt tolerated injection well.  Pt to schedule next injection upon check out.

## 2020-05-24 ENCOUNTER — Encounter: Payer: Self-pay | Admitting: Internal Medicine

## 2020-06-18 ENCOUNTER — Encounter: Payer: Self-pay | Admitting: Adult Health

## 2020-06-19 ENCOUNTER — Encounter: Payer: Self-pay | Admitting: Adult Health

## 2020-06-19 ENCOUNTER — Encounter (HOSPITAL_COMMUNITY): Payer: Self-pay

## 2020-06-19 ENCOUNTER — Ambulatory Visit (INDEPENDENT_AMBULATORY_CARE_PROVIDER_SITE_OTHER): Payer: Medicare Other | Admitting: Adult Health

## 2020-06-19 ENCOUNTER — Emergency Department (HOSPITAL_COMMUNITY): Payer: Medicare Other

## 2020-06-19 ENCOUNTER — Other Ambulatory Visit: Payer: Self-pay

## 2020-06-19 ENCOUNTER — Emergency Department (HOSPITAL_COMMUNITY)
Admission: EM | Admit: 2020-06-19 | Discharge: 2020-06-19 | Disposition: A | Discharge status: Home / Self Care | Payer: Medicare Other | Attending: Emergency Medicine | Admitting: Emergency Medicine

## 2020-06-19 ENCOUNTER — Telehealth: Payer: Self-pay | Admitting: Adult Health

## 2020-06-19 ENCOUNTER — Encounter: Payer: Self-pay | Admitting: Internal Medicine

## 2020-06-19 VITALS — BP 147/90 | HR 88 | Ht 65.0 in | Wt 150.0 lb

## 2020-06-19 DIAGNOSIS — R4189 Other symptoms and signs involving cognitive functions and awareness: Secondary | ICD-10-CM

## 2020-06-19 DIAGNOSIS — N1832 Chronic kidney disease, stage 3b: Secondary | ICD-10-CM | POA: Insufficient documentation

## 2020-06-19 DIAGNOSIS — Y939 Activity, unspecified: Secondary | ICD-10-CM | POA: Diagnosis not present

## 2020-06-19 DIAGNOSIS — R9082 White matter disease, unspecified: Secondary | ICD-10-CM | POA: Diagnosis not present

## 2020-06-19 DIAGNOSIS — Z7982 Long term (current) use of aspirin: Secondary | ICD-10-CM | POA: Diagnosis not present

## 2020-06-19 DIAGNOSIS — E785 Hyperlipidemia, unspecified: Secondary | ICD-10-CM | POA: Diagnosis not present

## 2020-06-19 DIAGNOSIS — W19XXXA Unspecified fall, initial encounter: Secondary | ICD-10-CM | POA: Diagnosis not present

## 2020-06-19 DIAGNOSIS — I129 Hypertensive chronic kidney disease with stage 1 through stage 4 chronic kidney disease, or unspecified chronic kidney disease: Secondary | ICD-10-CM | POA: Diagnosis not present

## 2020-06-19 DIAGNOSIS — I63531 Cerebral infarction due to unspecified occlusion or stenosis of right posterior cerebral artery: Secondary | ICD-10-CM

## 2020-06-19 DIAGNOSIS — G319 Degenerative disease of nervous system, unspecified: Secondary | ICD-10-CM | POA: Diagnosis not present

## 2020-06-19 DIAGNOSIS — I639 Cerebral infarction, unspecified: Secondary | ICD-10-CM | POA: Diagnosis not present

## 2020-06-19 DIAGNOSIS — S51011A Laceration without foreign body of right elbow, initial encounter: Secondary | ICD-10-CM | POA: Insufficient documentation

## 2020-06-19 DIAGNOSIS — Z79899 Other long term (current) drug therapy: Secondary | ICD-10-CM | POA: Insufficient documentation

## 2020-06-19 DIAGNOSIS — S81011A Laceration without foreign body, right knee, initial encounter: Secondary | ICD-10-CM | POA: Insufficient documentation

## 2020-06-19 DIAGNOSIS — Y929 Unspecified place or not applicable: Secondary | ICD-10-CM | POA: Insufficient documentation

## 2020-06-19 DIAGNOSIS — Y999 Unspecified external cause status: Secondary | ICD-10-CM | POA: Diagnosis not present

## 2020-06-19 DIAGNOSIS — I10 Essential (primary) hypertension: Secondary | ICD-10-CM | POA: Diagnosis not present

## 2020-06-19 DIAGNOSIS — Z23 Encounter for immunization: Secondary | ICD-10-CM | POA: Diagnosis not present

## 2020-06-19 DIAGNOSIS — T07XXXA Unspecified multiple injuries, initial encounter: Secondary | ICD-10-CM

## 2020-06-19 DIAGNOSIS — I69319 Unspecified symptoms and signs involving cognitive functions following cerebral infarction: Secondary | ICD-10-CM | POA: Diagnosis not present

## 2020-06-19 DIAGNOSIS — S81812A Laceration without foreign body, left lower leg, initial encounter: Secondary | ICD-10-CM | POA: Diagnosis not present

## 2020-06-19 DIAGNOSIS — I6389 Other cerebral infarction: Secondary | ICD-10-CM | POA: Diagnosis not present

## 2020-06-19 DIAGNOSIS — Z87891 Personal history of nicotine dependence: Secondary | ICD-10-CM | POA: Diagnosis not present

## 2020-06-19 DIAGNOSIS — R2689 Other abnormalities of gait and mobility: Secondary | ICD-10-CM | POA: Diagnosis present

## 2020-06-19 LAB — COMPREHENSIVE METABOLIC PANEL
ALT: 9 U/L (ref 0–44)
AST: 11 U/L — ABNORMAL LOW (ref 15–41)
Albumin: 3.8 g/dL (ref 3.5–5.0)
Alkaline Phosphatase: 71 U/L (ref 38–126)
Anion gap: 8 (ref 5–15)
BUN: 16 mg/dL (ref 8–23)
CO2: 27 mmol/L (ref 22–32)
Calcium: 10.2 mg/dL (ref 8.9–10.3)
Chloride: 106 mmol/L (ref 98–111)
Creatinine, Ser: 1.15 mg/dL — ABNORMAL HIGH (ref 0.44–1.00)
GFR, Estimated: 47 mL/min — ABNORMAL LOW (ref >60)
Glucose, Bld: 103 mg/dL — ABNORMAL HIGH (ref 70–99)
Potassium: 4.5 mmol/L (ref 3.5–5.1)
Sodium: 141 mmol/L (ref 135–145)
Total Bilirubin: 1 mg/dL (ref 0.3–1.2)
Total Protein: 6.1 g/dL — ABNORMAL LOW (ref 6.5–8.1)

## 2020-06-19 LAB — CBC WITH DIFFERENTIAL/PLATELET
Abs Immature Granulocytes: 0.02 10*3/uL (ref 0.00–0.07)
Basophils Absolute: 0.1 10*3/uL (ref 0.0–0.1)
Basophils Relative: 1 %
Eosinophils Absolute: 0.3 10*3/uL (ref 0.0–0.5)
Eosinophils Relative: 4 %
HCT: 40.8 % (ref 36.0–46.0)
Hemoglobin: 13.4 g/dL (ref 12.0–15.0)
Immature Granulocytes: 0 %
Lymphocytes Relative: 21 %
Lymphs Abs: 1.6 10*3/uL (ref 0.7–4.0)
MCH: 30.2 pg (ref 26.0–34.0)
MCHC: 32.8 g/dL (ref 30.0–36.0)
MCV: 92.1 fL (ref 80.0–100.0)
Monocytes Absolute: 1 10*3/uL (ref 0.1–1.0)
Monocytes Relative: 13 %
Neutro Abs: 4.6 10*3/uL (ref 1.7–7.7)
Neutrophils Relative %: 61 %
Platelets: 257 10*3/uL (ref 150–400)
RBC: 4.43 MIL/uL (ref 3.87–5.11)
RDW: 12.7 % (ref 11.5–15.5)
WBC: 7.7 10*3/uL (ref 4.0–10.5)
nRBC: 0 % (ref 0.0–0.2)

## 2020-06-19 MED ORDER — MEMANTINE HCL 28 X 5 MG & 21 X 10 MG PO TABS: ORAL_TABLET | ORAL | 0 refills | Status: DC

## 2020-06-19 MED ORDER — BACITRACIN ZINC 500 UNIT/GM EX OINT
TOPICAL_OINTMENT | Freq: Two times a day (BID) | CUTANEOUS | Status: DC
  Filled 2020-06-19 (×3): qty 0.9

## 2020-06-19 MED ORDER — TETANUS-DIPHTH-ACELL PERTUSSIS 5-2.5-18.5 LF-MCG/0.5 IM SUSY
0.5000 mL | PREFILLED_SYRINGE | Freq: Once | INTRAMUSCULAR | Status: AC
  Administered 2020-06-19: 0.5 mL via INTRAMUSCULAR
  Filled 2020-06-19: qty 0.5

## 2020-06-19 MED ORDER — BACITRACIN ZINC 500 UNIT/GM EX OINT: 1.0000 "application " | TOPICAL_OINTMENT | Freq: Two times a day (BID) | CUTANEOUS | 0 refills | Status: DC

## 2020-06-19 NOTE — ED Notes (Signed)
Staff performed dressing changes on the pt's leg, knee, and elbow wounds. Betadine, bacitracin, and non-adhesive dressings used. Pt education provided.

## 2020-06-19 NOTE — Discharge Instructions (Signed)
We saw you in the ER for your WOUND. Please read the instructions provided on wound care. Keep the area clean and dry, apply bacitracin ointment twice daily stating tomorrow night  RETURN TO THE ER IF THERE IS INCREASED PAIN, REDNESS, PUS COMING OUT from the wound site.  We have provided you with information on wound care clinic, please call them if the wound is not healing or follow-up with your primary care doctor.

## 2020-06-19 NOTE — Progress Notes (Signed)
[facility] [address] [place]. [place] [address] [phone]       STROKE FOLLOW UP NOTE  [name] Date of Birth:  [birth year] Medical Record Number:  [id number]   Reason for Referral: stroke follow up    SUBJECTIVE:   CHIEF COMPLAINT:  Chief Complaint  Patient presents with  . Follow-up    tx rm, CVA fu, with granddaughter states memory is worse     HPI:   Today, 06/19/2020, Jocelyn [name] returns for stroke follow-up accompanied by her granddaughter.  Granddaughter sent my chart message on 06/18/2020 with concerns of frequent falls and gradual worsening cognition over the past 3 months (see MyChart message 11/7). Per granddaughter, she has had multiple falls with 2 falls over the past month with fall on 10/12 apparently in the middle of the night but did not call her granddaughter until later the following day. Per granddaughter, multiple lacerations including back of her head on the left side and 3 areas in her left arm.  She refused to be evaluated at that time.  She had an additional fall recently thankfully without injury.  She is currently living independently but does have family checking on her frequently and cameras throughout the house but granddaughter has appropriate safety concerns as she is unaware of her physical and cognitive deficits.  Granddaughter attempted to place her in ALF with patient adamantly refused.  She will be agitated or irritable quickly, frequently ask where her husband is (who passed away from Alzheimer's disease) and become more confused in the evening or initially upon awakening in the morning.  Reports sleeping well throughout the night.  She does admit to depression/anxiety with PCP previously starting her on duloxetine 30 mg daily but denies any benefit.  Admits to sedentary lifestyle with majority of her days spent watching TV.  Previously working with therapies but these were discontinued due to lack of participation and  improvement.  Denies focal deficits such as speech difficulty, hemiparesis, visual changes or sensory changes.  She has remained on aspirin and Plavix despite 31-month recommendation without side effects.  Remains on atorvastatin 40 mg daily without myalgias.  Blood pressure today 147/90.  No further concerns at this time.     History provided for reference purposes only Initial visit 03/01/2020 JM: Jocelyn [name] is being seen for hospital follow-up accompanied by her granddaughter Jocelyn [name].  Residual deficits of mild imbalance and mild cognitive impairment which has been improving.  Currently working with home health therapy which is to be completed after additional 2 sessions.  She was evaluated by OT or SLP.  Previously living independently but currently staying with family due to safety concerns in regards to ambulation, performing ADLs and IADLs and cognitive impairment.  Currently only on Plavix and discontinued aspirin after 3 weeks (per granddaughter, this was advised at hospital discharge).  Denies bleeding or bruising currently or while on DAPT.  Continues on atorvastatin without myalgias.  Blood pressure today 130/70.  No other concerns at this time.  Stroke admission 01/31/2020 Personally reviewed all hospital notes and pertinent lab work and imaging Jocelyn [name] is a 86 y.o. female with history of HTN, HLD  who presented on 01/31/2020 with waxing and waning confusion, left-sided visual loss and R side numbness.  Stroke work-up revealed right PCA infarct in setting of right PCA high-grade stenosis, infarct secondary to large vessel disease source.  CTA head/neck diffuse intracranial stenosis (additional information below).  Recommended DAPT for 3 months then Plavix  alone as previously on aspirin.  HTN stable.  LDL 123 initiate atorvastatin 40 mg daily.  Other stroke risk factors include advanced age, former tobacco use, EtOH use, family history of stroke but no personal history of stroke.  Evaluated  by therapy and recommended outpatient PT and discharged home in stable condition.  Stroke:   R PCA infarct in setting of R PCA high-grade stenosis, infarcts secondary to large vessel disease source  MRI  Acute and subacute R occipital infarcts. Moderate small vessel disease.   CTA head & neck proximal R P2 high-grade stenosis associated w/ infarct. R P3 superior division occlusion. Moderate L P2 stenosis. L A1 high-grade stenosis. Moderate mid R frontoparietal branch stenosis RM3. B narrowing ACA and MCA branches. Supraclinoid R ICA 50%. B ICA bifurcation atherosclerosis. 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 No source of embolus   LDL 123  HgbA1c pending   aspirin 81 mg daily prior to admission, now on aspirin 81 mg daily and clopidogrel 75 mg daily. Continue DAPT x 3 months then plavix alone  Therapy recommendations:  OP PT  Disposition:  D/c home      ROS:   14 system review of systems performed and negative with exception of memory loss, gait impairment, frequent falls and confusion  PMH:  Past Medical History:  Diagnosis Date  . HTN (hypertension)   . Hyperlipidemia   . Urinary incontinence     PSH:  Past Surgical History:  Procedure Laterality Date  . ABDOMINAL HYSTERECTOMY    . TONSILLECTOMY      Social History:  Social History   Socioeconomic History  . Marital status: Widowed    Spouse name: Not on file  . Number of children: 1  . Years of education: Not on file  . Highest education level: Not on file  Occupational History  . Occupation: Retired  Tobacco Use  . Smoking status: Former Research scientist (life sciences)  . Smokeless tobacco: Former Systems developer    Quit date: 03/17/2017  . Tobacco comment: STATES SHE QUIT 25 YEARS AGO  Vaping Use  . Vaping Use: Never used  Substance and Sexual Activity  . Alcohol use: Yes    Alcohol/week: 14.0 standard drinks    Types: 7 Glasses of wine, 7 Cans of beer per week  . Drug use:  No  . Sexual activity: Never    Birth control/protection: Surgical  Other Topics Concern  . Not on file  Social History Narrative   Regular Exercise-no         Social Determinants of Health   Financial Resource Strain: Low Risk   . Difficulty of Paying Living Expenses: Not hard at all  Food Insecurity: No Food Insecurity  . Worried About Charity fundraiser in the Last Year: Never true  . Ran Out of Food in the Last Year: Never true  Transportation Needs: No Transportation Needs  . Lack of Transportation (Medical): No  . Lack of Transportation (Non-Medical): No  Physical Activity:   . Days of Exercise per Week: Not on file  . Minutes of Exercise per Session: Not on file  Stress: No Stress Concern Present  . Feeling of Stress : Not at all  Social Connections: Unknown  . Frequency of Communication with Friends and Family: More than three times a week  . Frequency of Social Gatherings with Friends and Family: More than three times a week  . Attends Religious Services: Not on  file  . Active Member of Clubs or Organizations: Not on file  . Attends Archivist Meetings: Not on file  . Marital Status: Widowed  Intimate Partner Violence:   . Fear of Current or Ex-Partner: Not on file  . Emotionally Abused: Not on file  . Physically Abused: Not on file  . Sexually Abused: Not on file    Family History:  Family History  Problem Relation Age of Onset  . Diabetes Other        1st degree relative  . Hyperlipidemia Other   . Stroke Other        1st degree Female relative <50  . Cancer Neg Hx   . Early death Neg Hx   . Heart disease Neg Hx   . Hypertension Neg Hx   . Kidney disease Neg Hx     Medications:   Current Outpatient Medications on File Prior to Visit  Medication Sig Dispense Refill  . ASPIRIN 81 PO Take by mouth.    Marland Kitchen atorvastatin (LIPITOR) 40 MG tablet Take 1 tablet (40 mg total) by mouth daily. 90 tablet 0  . clopidogrel (PLAVIX) 75 MG tablet Take 1  tablet (75 mg total) by mouth daily. 90 tablet 0  . DULoxetine (CYMBALTA) 30 MG capsule TAKE ONE CAPSULE BY MOUTH ONE TIME DAILY 90 capsule 0  . solifenacin (VESICARE) 10 MG tablet Take 1 tablet (10 mg total) by mouth daily. 90 tablet 1   Current Facility-Administered Medications on File Prior to Visit  Medication Dose Route Frequency Provider Last Rate Last Admin  . cyanocobalamin ((VITAMIN B-12)) injection 1,000 mcg  1,000 mcg Intramuscular Q30 days Janith Lima, MD   1,000 mcg at 05/15/20 1010    Allergies:   Allergies  Allergen Reactions  . Codeine Itching and Nausea And Vomiting  . Tape Rash    Tears the skin      OBJECTIVE:  Physical Exam  Vitals:   06/19/20 0959  BP: (!) 147/90  Pulse: 88  Weight: 150 lb (68 kg)  Height: 5\' 5"  (1.651 m)   Body mass index is 24.96 kg/m. No exam data present  General: well developed, well nourished, pleasant elderly Caucasian female, seated, in no evident distress Head: head normocephalic and atraumatic.   Neck: supple with no carotid or supraclavicular bruits Cardiovascular: regular rate and rhythm, no murmurs Musculoskeletal: no deformity Skin:  no rash/petichiae Vascular:  Normal pulses all extremities   Neurologic Exam Mental Status: Awake and fully alert. :  Speech and language.  Oriented to place and time. Recent memory impaired and remote memory intact. Attention span, concentration and fund of knowledge impaired. Mood and affect appropriate.  MMSE - Mini Mental State Exam 06/19/2020 12/08/2017 03/11/2016  Not completed: - - (No Data)  Orientation to time 4 5 -  Orientation to Place 5 5 -  Registration 3 3 -  Attention/ Calculation 1 5 -  Recall 2 3 -  Language- name 2 objects 2 2 -  Language- repeat 1 1 -  Language- follow 3 step command 3 3 -  Language- read & follow direction 1 1 -  Write a sentence 1 1 -  Copy design 1 1 -  Copy design-comments 6 animals - -  Total score 24 30 -   Cranial Nerves: Pupils  equal, briskly reactive to light. Extraocular movements full without nystagmus. Visual fields full to confrontation. Hearing intact. Facial sensation intact. Face, tongue, palate moves normally and symmetrically.  Motor: Normal  bulk and tone. Normal strength in all tested extremity muscles except mild b/l hip flexor weakness Sensory.: intact to touch , pinprick , position and vibratory sensation.  Coordination: Rapid alternating movements normal in all extremities except slightly decreased left hand. Finger-to-nose and heel-to-shin performed accurately bilaterally. Gait and Station: Arises from chair without difficulty. Stance is normal. Gait demonstrates normal stride length and mild to moderate imbalance occasionally using cane for majority of ambulation she carries the cane Reflexes: 1+ and symmetric. Toes downgoing.       ASSESSMENT/PLAN: Harmony Sandell is a 86 y.o. year old female presented with waxing/waning confusion, right-sided numbness and left-sided visual impairment on 01/31/2020 with stroke work-up revealing R PCA infarct in setting of R PCA high-grade stenosis, infarcts secondary to large vessel disease source. Vascular risk factors include HTN, HLD, diffuse intracranial stenosis, former tobacco use, advanced age and EtOH use.      1. Worsening cognition and gait: a. Progressive over the past 3 months.  Possibly multifactorial in setting of residual deficits of cognitive and gait impairment post stroke, deconditioning with sedentary lifestyle and limited physical activity, and underlying depression/anxiety.  Although, unable to rule out reversible causes and recommend obtaining MRI w/wo, EEG and dementia panel.  Encouraged follow-up with PCP to discuss continued underlying depression/anxiety which could be contributing to worsening cognition. b. Discussed possibility of additional therapy sessions but as she previously did not adequately participate this would likely not be  beneficial c. Recommend initiating Namenda titration pack for eventual dose of 10 mg twice daily d. Discussed transition to assisted living facility due to safety concerns as she has lack of insight and safety awareness of her deficits but patient continues to adamantly refuse despite increased risks of living alone.  Granddaughter plans on discussing further with social work which was ordered by her PCP 2. R PCA stroke:  a. Residual deficits: Cognitive impairment and imbalance.  See above. b. Continue clopidogrel 75 mg daily and atorvastatin 40 mg daily for secondary stroke prevention.  Advised to discontinue aspirin at this time as prolonged DAPT not indicated from a stroke standpoint. c. Discussed secondary stroke prevention measures and importance of close PCP follow-up for aggressive stroke risk factor management. 3. Intracranial stenosis: Currently asymptomatic.  Completed 3 months DAPT advised ongoing use of Plavix alone and statin as well as importance of aggressive stroke risk factor management.  Continue medical management at this time.  No indication for repeat imaging or surveillance monitoring as she remains asymptomatic 4. HTN: BP goal<130/90.  Stable currently diet controlled per PCP 5. HLD: LDL goal<70.  Recent LDL 123.  On atorvastatin 40 mg daily per PCP     Follow up in 4 months or call earlier if needed   CC:  [facility] provider: Dr. Lenda Kelp, Arvid Right, MD    I spent a prolonged 45 minutes of face-to-face and non-face-to-face time with patient and granddaughter.  This included previsit chart review, lab review, study review, order entry, electronic health record documentation, patient education regarding worsening cognition and gait with frequent falls and possible etiology, history of stroke, residual deficits, importance of managing stroke risk factors and answered all questions to patient and granddaughters satisfaction   Frann Rider, AGNP-BC  [facility] [address] [place], [place] [address]  Phone [phone] Fax [phone] Note: This document was prepared with digital dictation and possible smart phrase technology. Any transcriptional errors that result from this process are unintentional.

## 2020-06-19 NOTE — Telephone Encounter (Signed)
Medicare/bcbs supp order sent to GI. No auth they will reach out to the patient to schedule.  

## 2020-06-19 NOTE — Patient Instructions (Signed)
We will check blood work today to rule out reversable causes of continued memory loss. Also recommend obtaining an MRI of your brain to rule out abnormality and EEG which will look for seizures - you will be called to schedule MRI and EEG  Recommend starting Namenda on titration pack which can help with memory and cognition after a stroke  Continue clopidogrel 75 mg daily  and atrovastatin  for secondary stroke prevention You can stop the aspirin at this time and prolonged aspirin and plavix use not indicated   Continue to follow up with PCP regarding cholesterol and blood pressure management  Maintain strict control of hypertension with blood pressure goal below 130/90, diabetes with hemoglobin A1c goal below 6.5% and cholesterol with LDL cholesterol (bad cholesterol) goal below 70 mg/dL.       Followup in the future with me in 4 months or call earlier if needed       Thank you for coming to see Jocelyn [name] at [facility]. I hope we have been able to provide you high quality care today.  You may receive a patient satisfaction survey over the next few weeks. We would appreciate your feedback and comments so that we may continue to improve ourselves and the health of our patients.    Memantine Tablets What is this medicine? MEMANTINE (MEM an teen) is used to treat dementia caused by Alzheimer's disease. This medicine may be used for other purposes; ask your health care provider or pharmacist if you have questions. COMMON BRAND NAME(S): Namenda What should I tell my health care provider before I take this medicine? They need to know if you have any of these conditions:  difficulty passing urine  kidney disease  liver disease  seizures  an unusual or allergic reaction to memantine, other medicines, foods, dyes, or preservatives  pregnant or trying to get pregnant  breast-feeding How should I use this medicine? Take this medicine by mouth with a glass of water.  Follow the directions on the prescription label. You may take this medicine with or without food. Take your doses at regular intervals. Do not take your medicine more often than directed. Continue to take your medicine even if you feel better. Do not stop taking except on the advice of your doctor or health care professional. Talk to your pediatrician regarding the use of this medicine in children. Special care may be needed. Overdosage: If you think you have taken too much of this medicine contact a poison control center or emergency room at once. NOTE: This medicine is only for you. Do not share this medicine with others. What if I miss a dose? If you miss a dose, take it as soon as you can. If it is almost time for your next dose, take only that dose. Do not take double or extra doses. If you do not take your medicine for several days, contact your health care provider. Your dose may need to be changed. What may interact with this medicine?  acetazolamide  amantadine  cimetidine  dextromethorphan  dofetilide  hydrochlorothiazide  ketamine  metformin  methazolamide  quinidine  ranitidine  sodium bicarbonate  triamterene This list may not describe all possible interactions. Give your health care provider a list of all the medicines, herbs, non-prescription drugs, or dietary supplements you use. Also tell them if you smoke, drink alcohol, or use illegal drugs. Some items may interact with your medicine. What should I watch for while using this medicine? Visit your doctor  or health care professional for regular checks on your progress. Check with your doctor or health care professional if there is no improvement in your symptoms or if they get worse. You may get drowsy or dizzy. Do not drive, use machinery, or do anything that needs mental alertness until you know how this drug affects you. Do not stand or sit up quickly, especially if you are an older patient. This reduces the  risk of dizzy or fainting spells. Alcohol can make you more drowsy and dizzy. Avoid alcoholic drinks. What side effects may I notice from receiving this medicine? Side effects that you should report to your doctor or health care professional as soon as possible:  allergic reactions like skin rash, itching or hives, swelling of the face, lips, or tongue  agitation or a feeling of restlessness  depressed mood  dizziness  hallucinations  redness, blistering, peeling or loosening of the skin, including inside the mouth  seizures  vomiting Side effects that usually do not require medical attention (report to your doctor or health care professional if they continue or are bothersome):  constipation  diarrhea  headache  nausea  trouble sleeping This list may not describe all possible side effects. Call your doctor for medical advice about side effects. You may report side effects to FDA at 1-800-FDA-1088. Where should I keep my medicine? Keep out of the reach of children. Store at room temperature between 15 degrees and 30 degrees C (59 degrees and 86 degrees F). Throw away any unused medicine after the expiration date. NOTE: This sheet is a summary. It may not cover all possible information. If you have questions about this medicine, talk to your doctor, pharmacist, or health care provider.  2020 Elsevier/Gold Standard (2013-05-17 14:10:42)     Electroencephalogram, Adult An electroencephalogram (EEG) is a test that records electrical activity in the brain. It is often used to diagnose or monitor problems that are related to the brain, such as:  Seizure disorders.  Sleeping problems.  Changes in behavior.  Head injuries.  Fainting spells. Tell a health care provider about:  Any allergies you have.  All medicines you are taking, including vitamins, herbs, eye drops, creams, and over-the-counter medicines.  Any problems you or family members have had with anesthetic  medicines.  Any blood disorders you have.  Any surgeries you have had.  Any medical conditions you have or have had, including psychiatric conditions.  Any history of illegal drug use or alcohol abuse. What are the risks? Generally, this is a safe test. If you have a seizure disorder, you may be made to have a seizure during the test. This is done so that your brain activity can be recorded during the seizure. What happens before the procedure?   Arrive with your hair clean and dry. Do not comb your hair toward your scalp to add volume (backcomb), and do not put hair spray, oil, or other hair products in your hair.  Do not have caffeine within 4 hours of having your test.  Follow instructions from your health care provider about: ? Other eating or drinking restrictions. ? Sleeping before the test.  Ask your health care provider about taking your regular and over-the-counter medicines, herbs, and supplements. What happens during the procedure? You will be asked to sit in a chair or lie down. Small metal discs (electrodes) will be attached to your head with an adhesive. These electrodes will pick up on the signals in your brain, and a machine will  record the signals. During the test, you may be asked to:  Sit or lie quietly and relax.  Open and close your eyes.  Breathe deeply and rapidly for 3 minutes or longer.  Look at a flashing light for a short period of time.  Read or look at an image.  Go to sleep. When the test is complete, the electrodes will be removed by using a solution such as acetone. What happens after the procedure? It is up to you to get the results of your procedure. Ask your health care provider, or the department that is doing the procedure, when your results will be ready. Summary  An electroencephalogram (EEG) is a test that is often used to diagnose or monitor problems that are related to the brain.  Do not have caffeine within 4 hours of having your  test. Follow other instructions from your health care provider about eating and drinking before the test.  During the procedure, small metal discs (electrodes) will be attached to your head with an adhesive.  During the test, you may be asked to sit or lie quietly and relax. You may also be asked to do other activities during the test. This information is not intended to replace advice given to you by your health care provider. Make sure you discuss any questions you have with your health care provider. Document Revised: 06/02/2018 Document Reviewed: 06/02/2018 Elsevier Patient Education  2020 [employer].

## 2020-06-19 NOTE — ED Notes (Signed)
Patients arm cleaned with NS, bacitracin applied, telfa, and cling.

## 2020-06-19 NOTE — ED Triage Notes (Signed)
Patient was attempting to get out of her car to check in as a patient and had a fall. She was coming to the ER for falls. Patient reports she has had multiple falls recently and is not sure why.

## 2020-06-19 NOTE — ED Notes (Signed)
Patient transported to MRI 

## 2020-06-20 LAB — DEMENTIA PANEL
Homocysteine: 11.9 umol/L (ref 0.0–21.3)
RPR Ser Ql: NONREACTIVE
TSH: 1.85 u[IU]/mL (ref 0.450–4.500)
Vitamin B-12: 496 pg/mL (ref 232–1245)

## 2020-06-20 NOTE — Progress Notes (Signed)
I agree with the above plan 

## 2020-06-20 NOTE — ED Provider Notes (Signed)
[name] DEPT Provider Note   CSN: [id number] Arrival date & time: 06/19/20  1553     History Chief Complaint  Patient presents with  . Fall    Jocelyn [name] is a 86 y.o. female.  HPI    86 year old female with history of hypertension, hyperlipidemia and a recent stroke comes in a chief complaint of fall.  Patient has had 3 falls in the last week.  She is here with her granddaughter.  According to the patient, after she had a stroke in June she has had some issues with balance.  The symptoms have worsened recently.  Per granddaughter, patient when she ambulates almost appears like she is drunk.  Patient is stating that her imbalance is intermittently present.  Granddaughter once patient's wound to be assessed as well.  She has a deep gash in her right shin that occurred from her fall more than 2 days ago.  She has been applying over-the-counter dressing.  Granddaughter saw the wound today and is concerned about it.  While in the parking lot of the hospital patient had another fall.  Patient did not strike her head.  Granddaughter reports that she was able to hold patient's head.  There is no headache, nausea, vomiting, new neurologic symptoms, vision change, confusion.  Patient denies any pain elsewhere besides the wound site in her right elbow.  She is not on any blood thinners.  Review of system is negative for any recent nausea, vomiting, reduced p.o. intake, fevers, chills, UTI  Past Medical History:  Diagnosis Date  . HTN (hypertension)   . Hyperlipidemia   . Urinary incontinence     Patient Active Problem List   Diagnosis Date Noted  . Dysuria 02/23/2020  . OAB (overactive bladder) 02/23/2020  . Cerebrovascular accident (CVA) due to stenosis of right posterior cerebral artery ([place]) 02/08/2020  . Stage 3b chronic kidney disease ([place]) 01/26/2020  . Current severe episode of major depressive disorder without psychotic features without prior  episode ([place]) 01/25/2020  . Vitamin B12 deficiency neuropathy ([place]) 01/25/2020  . Primary osteoarthritis involving multiple joints 12/08/2017  . Thoracic aorta atherosclerosis ([place]) 12/12/2014  . Depression with anxiety 06/09/2013  . Other abnormal glucose 06/09/2013  . Routine general medical examination at a health care facility 06/09/2013  . Dyslipidemia, goal LDL below 130 09/20/2010  . Essential hypertension 12/22/2008    Past Surgical History:  Procedure Laterality Date  . ABDOMINAL HYSTERECTOMY    . TONSILLECTOMY       OB History   No obstetric history on file.     Family History  Problem Relation Age of Onset  . Diabetes Other        1st degree relative  . Hyperlipidemia Other   . Stroke Other        1st degree Female relative <50  . Cancer Neg Hx   . Early death Neg Hx   . Heart disease Neg Hx   . Hypertension Neg Hx   . Kidney disease Neg Hx     Social History   Tobacco Use  . Smoking status: Former Research scientist (life sciences)  . Smokeless tobacco: Former Systems developer    Quit date: 03/17/2017  . Tobacco comment: STATES SHE QUIT 25 YEARS AGO  Vaping Use  . Vaping Use: Never used  Substance Use Topics  . Alcohol use: Yes    Alcohol/week: 14.0 standard drinks    Types: 7 Glasses of wine, 7 Cans of beer per week  . Drug use:  No    Home Medications Prior to Admission medications   Medication Sig Start Date End Date Taking? Authorizing Provider  ASPIRIN 81 PO Take by mouth.    [provider]  atorvastatin (LIPITOR) 40 MG tablet Take 1 tablet (40 mg total) by mouth daily. 04/28/20 07/27/20  Janith Lima, MD  bacitracin ointment Apply 1 application topically 2 (two) times daily. 06/19/20   Varney Biles, MD  clopidogrel (PLAVIX) 75 MG tablet Take 1 tablet (75 mg total) by mouth daily. 04/28/20 07/27/20  Janith Lima, MD  DULoxetine (CYMBALTA) 30 MG capsule TAKE ONE CAPSULE BY MOUTH ONE TIME DAILY 04/20/20   Janith Lima, MD  memantine [facility] TITRATION PAK) tablet pack 5  mg/day for =1 week; 5 mg twice daily for =1 week; 15 mg/day given in 5 mg and 10 mg separated doses for =1 week; then 10 mg twice daily 06/19/20   Frann Rider, NP  solifenacin (VESICARE) 10 MG tablet Take 1 tablet (10 mg total) by mouth daily. 02/23/20   Janith Lima, MD    Allergies    Codeine and Tape  Review of Systems   Review of Systems  Constitutional: Positive for activity change.  Respiratory: Negative for shortness of breath.   Cardiovascular: Negative for chest pain.  Gastrointestinal: Negative for nausea and vomiting.  Musculoskeletal: Positive for arthralgias.  Skin: Positive for wound.  Neurological: Positive for dizziness and weakness.  Hematological: Does not bruise/bleed easily.  All other systems reviewed and are negative.   Physical Exam Updated Vital Signs BP (!) 174/90   Pulse 68   Temp 97.9 F (36.6 C) (Oral)   Resp 18   SpO2 90%   Physical Exam Vitals and nursing note reviewed.  Constitutional:      Appearance: She is well-developed.  HENT:     Head: Normocephalic and atraumatic.  Eyes:     Pupils: Pupils are equal, round, and reactive to light.  Cardiovascular:     Rate and Rhythm: Normal rate and regular rhythm.     Heart sounds: Normal heart sounds. No murmur heard.   Pulmonary:     Effort: Pulmonary effort is normal. No respiratory distress.  Abdominal:     General: There is no distension.     Palpations: Abdomen is soft.     Tenderness: There is no abdominal tenderness. There is no guarding or rebound.  Musculoskeletal:        General: Tenderness and signs of injury present. No deformity.     Cervical back: Neck supple.     Comments: No significant tenderness with palpation of the lower extremity or upper extremity.  Range of motion over the knee and elbow is normal for both upper and lower extremity.  Skin:    General: Skin is warm and dry.     Findings: Bruising present. No lesion.     Comments: Patient has a large linear  superficial laceration over the left shin.  Underlying muscle and fascia is exposed the wound is clean.  No drainage, no surrounding erythema.  She also has superficial skin tears to the right knee and right elbow.  Neurological:     Mental Status: She is alert and oriented to person, place, and time.     Cranial Nerves: No cranial nerve deficit.     Sensory: No sensory deficit.     Motor: No weakness.     Coordination: Coordination normal.     ED Results / Procedures / Treatments  Labs (all labs ordered are listed, but only abnormal results are displayed) Labs Reviewed  COMPREHENSIVE METABOLIC PANEL - Abnormal; Notable for the following components:      Result Value   Glucose, Bld 103 (*)    Creatinine, Ser 1.15 (*)    Total Protein 6.1 (*)    AST 11 (*)    GFR, Estimated 47 (*)    All other components within normal limits  CBC WITH DIFFERENTIAL/PLATELET    EKG None  Radiology MR BRAIN WO CONTRAST  Result Date: 06/19/2020 CLINICAL DATA:  Dizziness, nonspecific.  Prominent dental implants. EXAM: MRI HEAD WITHOUT CONTRAST TECHNIQUE: Multiplanar, multiecho pulse sequences of the brain and surrounding structures were obtained without intravenous contrast. COMPARISON:  CTA head and neck 02/01/2020. MR head without and with contrast 01/30/2021 FINDINGS: Brain: Persistent diffusion signal abnormality is present in the medial right occipital lobe. This may represent susceptibility artifact. Other areas of diffusion abnormality on the prior exam no longer present. Extensive confluent periventricular and subcortical white matter changes are present. Remote lacunar infarcts involving the left lentiform nucleus and internal capsule as well as the right thalamus are again noted. Remote lacunar infarct of the right cerebral peduncle is noted. Remote lacunar infarcts are present within the cerebellum bilaterally. No acute infarct is present. No acute or focal cortical abnormality is present.  Posterior fossa somewhat obscured by dental heart artifact. The ventricles are proportionate to the degree of atrophy. No significant extraaxial fluid collection is present. White matter changes extend into the brainstem Vascular: Flow is present in the major intracranial arteries. Skull and upper cervical spine: The craniocervical junction is normal. Upper cervical spine is within normal limits. Marrow signal is unremarkable. Sinuses/Orbits: Sinuses are obscured by artifact. Bilateral lens replacements are noted. Globes and orbits are otherwise unremarkable. IMPRESSION: 1. No acute intracranial abnormality or significant interval change. 2. Persistent diffusion signal abnormality in the medial right occipital lobe. This may represent susceptibility artifact. Evolution of the medial right occipital lobe infarct is otherwise as expected. 3. Remote lacunar infarcts of the left lentiform nucleus and internal capsule as well as the right thalamus are again noted. 4. Extensive confluent periventricular and subcortical white matter disease bilaterally is moderately advanced for age. This likely reflects the sequela of chronic microvascular ischemia. Electronically Signed   By: San Morelle M.D.   On: 06/19/2020 17:37    Procedures Procedures (including critical care time)  Medications Ordered in ED Medications  Tdap (BOOSTRIX) injection 0.5 mL (0.5 mLs Intramuscular Given 06/19/20 1753)    ED Course  I have reviewed the triage vital signs and the nursing notes.  Pertinent labs & imaging results that were available during my care of the patient were reviewed by me and considered in my medical decision making (see chart for details).    MDM Rules/Calculators/A&P                            Female with history of stroke comes in a chief complaint of fall. Granddaughter is concerned about the wound and brought her into the ER to ensure appropriate management.  Patient has been managing the wound  at home.  The laceration of concern was the one to her shin.  The wound is more than 29 days old.  It does not appear infected at this time.  It appears that she might have just lost a chunk of skin when she fell.  There is not much  wound care we can offer at this time, we will advise the RN to clean the wound and apply dressing.  Patient to follow-up with PCP for wound management.  Wound care clinic information provided in case the wound is getting worse.  No signs of infection/cellulitis.  Bacitracin topical ointment will be provided.  The other concern was balance.  It appears that since the stroke her symptoms of imbalance have been present, gotten worse more recently.  There does not appear to be underlying infectious process for dehydration is an issue.  Patient's granddaughter reports that they have seen a neurologist and there is plan to get MRI.  With her symptoms worsening and she having 3 falls in the last week, we ordered an MRI to ensure there is no new stroke.  Results of the MRI discussed with the patient and the granddaughter.  No acute stroke but there is definitely some changes in the occipital area where she had a stroke.  Will advise her to continue with the neurology outpatient follow-up.  Patient does not want to go to assisted living and at this time she does not want home health.  She lives by herself.  Utilization of assistance device recommended.  Patient has a cane and walker but does not typically use it.  Granddaughter reports that she received her PT for stroke, but since then she has not really been doing any exercise at home.  Advised PCP and neurology follow-up to see if she would benefit with further PT.   Final Clinical Impression(s) / ED Diagnoses Final diagnoses:  Occipital stroke ([place])  Multiple contusions  Skin tear of right elbow without complication, initial encounter    Rx / DC Orders ED Discharge Orders         Ordered    bacitracin ointment  2 times daily         06/19/20 2006           Varney Biles, MD 06/20/20 1812

## 2020-06-22 ENCOUNTER — Ambulatory Visit (INDEPENDENT_AMBULATORY_CARE_PROVIDER_SITE_OTHER): Payer: Medicare Other | Admitting: Internal Medicine

## 2020-06-22 ENCOUNTER — Telehealth: Payer: Self-pay

## 2020-06-22 ENCOUNTER — Encounter: Payer: Self-pay | Admitting: Internal Medicine

## 2020-06-22 ENCOUNTER — Ambulatory Visit (INDEPENDENT_AMBULATORY_CARE_PROVIDER_SITE_OTHER): Payer: Medicare Other

## 2020-06-22 ENCOUNTER — Other Ambulatory Visit: Payer: Self-pay

## 2020-06-22 VITALS — BP 116/70 | HR 89 | Temp 98.3°F | Resp 16 | Ht 65.0 in | Wt 149.0 lb

## 2020-06-22 DIAGNOSIS — I639 Cerebral infarction, unspecified: Secondary | ICD-10-CM

## 2020-06-22 DIAGNOSIS — S8991XA Unspecified injury of right lower leg, initial encounter: Secondary | ICD-10-CM | POA: Diagnosis not present

## 2020-06-22 DIAGNOSIS — S59901S Unspecified injury of right elbow, sequela: Secondary | ICD-10-CM | POA: Diagnosis not present

## 2020-06-22 DIAGNOSIS — S81811S Laceration without foreign body, right lower leg, sequela: Secondary | ICD-10-CM

## 2020-06-22 DIAGNOSIS — S8781XA Crushing injury of right lower leg, initial encounter: Secondary | ICD-10-CM

## 2020-06-22 DIAGNOSIS — S59901A Unspecified injury of right elbow, initial encounter: Secondary | ICD-10-CM | POA: Diagnosis not present

## 2020-06-22 DIAGNOSIS — S81811A Laceration without foreign body, right lower leg, initial encounter: Secondary | ICD-10-CM | POA: Insufficient documentation

## 2020-06-22 MED ORDER — OXYCODONE HCL 5 MG PO TABS: 5.0000 mg | ORAL_TABLET | ORAL | 0 refills | Status: DC | PRN

## 2020-06-22 MED ORDER — PROMETHAZINE HCL 12.5 MG PO TABS: 12.5000 mg | ORAL_TABLET | Freq: Four times a day (QID) | ORAL | 0 refills | Status: DC | PRN

## 2020-06-22 NOTE — Progress Notes (Signed)
Subjective:  Patient ID: Jocelyn [name], female    DOB: 04/01/1932  Age: 86 y.o. MRN: [id number]  CC: Wound Check  This visit occurred during the SARS-CoV-2 public health emergency.  Safety protocols were in place, including screening questions prior to the visit, additional usage of staff PPE, and extensive cleaning of exam room while observing appropriate contact time as indicated for disinfecting solutions.    HPI Jocelyn [name] presents for f/up -she has had several falls and injuries over the last week.  She developed a large skin tear over her right lower extremity but presented to the ED too late to have it repaired.  She also injured her right knee and her right elbow.  X-rays have not been done.  She complains that the areas are painful especially the right lower leg.  She has mild swelling over the dorsum of her right foot but the area does not feel hot.  None of her wounds are draining.  The wounds on the elbow are healing nicely.  The wound over the right knee is getting better but she remains concerned about the wound over her right lower leg.  Outpatient Medications Prior to Visit  Medication Sig Dispense Refill  . ASPIRIN 81 PO Take by mouth.    Marland Kitchen atorvastatin (LIPITOR) 40 MG tablet Take 1 tablet (40 mg total) by mouth daily. 90 tablet 0  . bacitracin ointment Apply 1 application topically 2 (two) times daily. 14 g 0  . clopidogrel (PLAVIX) 75 MG tablet Take 1 tablet (75 mg total) by mouth daily. 90 tablet 0  . DULoxetine (CYMBALTA) 30 MG capsule TAKE ONE CAPSULE BY MOUTH ONE TIME DAILY 90 capsule 0  . memantine ([facility] TITRATION PAK) tablet pack 5 mg/day for =1 week; 5 mg twice daily for =1 week; 15 mg/day given in 5 mg and 10 mg separated doses for =1 week; then 10 mg twice daily 49 tablet 0  . solifenacin (VESICARE) 10 MG tablet Take 1 tablet (10 mg total) by mouth daily. 90 tablet 1   Facility-Administered Medications Prior to Visit  Medication Dose Route Frequency Provider  Last Rate Last Admin  . cyanocobalamin ((VITAMIN B-12)) injection 1,000 mcg  1,000 mcg Intramuscular Q30 days Janith Lima, MD   1,000 mcg at 05/15/20 1010    ROS Review of Systems  Constitutional: Negative for chills and fever.  HENT: Negative.   Eyes: Negative for visual disturbance.  Respiratory: Negative for cough, chest tightness, shortness of breath and wheezing.   Cardiovascular: Negative for chest pain, palpitations and leg swelling.  Gastrointestinal: Negative for abdominal pain, constipation, diarrhea, nausea and vomiting.  Endocrine: Negative.   Genitourinary: Negative.  Negative for difficulty urinating.  Musculoskeletal: Positive for arthralgias. Negative for joint swelling.  Skin: Positive for wound. Negative for color change.  Neurological: Negative for dizziness and weakness.  Hematological: Negative for adenopathy. Does not bruise/bleed easily.  Psychiatric/Behavioral: Negative.     Objective:  BP 116/70   Pulse 89   Temp 98.3 F (36.8 C) (Oral)   Resp 16   Ht 5\' 5"  (1.651 m)   Wt 149 lb (67.6 kg)   SpO2 98%   BMI 24.79 kg/m   BP Readings from Last 3 Encounters:  06/22/20 116/70  06/19/20 (!) 174/90  06/19/20 (!) 147/90    Wt Readings from Last 3 Encounters:  06/22/20 149 lb (67.6 kg)  06/19/20 150 lb (68 kg)  03/01/20 154 lb (69.9 kg)    Physical Exam Vitals reviewed.  HENT:     Nose: Nose normal.     Mouth/Throat:     Mouth: Mucous membranes are moist.  Eyes:     General: No scleral icterus.    Conjunctiva/sclera: Conjunctivae normal.  Cardiovascular:     Rate and Rhythm: Normal rate and regular rhythm.     Heart sounds: No murmur heard.   Pulmonary:     Effort: Pulmonary effort is normal.     Breath sounds: No stridor. No wheezing or rales.  Abdominal:     General: Abdomen is flat.     Palpations: There is no mass.     Tenderness: There is no abdominal tenderness.  Musculoskeletal:     Right elbow: No swelling. Normal range of  motion. No tenderness.     Left elbow: Normal.     Cervical back: Neck supple.     Right knee: No swelling, deformity or bony tenderness. Normal range of motion. No tenderness.     Left knee: Normal.     Right lower leg: Deformity, laceration, tenderness and bony tenderness present. No swelling.     Left lower leg: Normal.       Legs:     Comments: There are healed scabbed abrasions over the posterior right elbow.  The elbow is not tender or swollen and has free range of motion.  See photo.  There is a healing abrasion over the right knee.  The area is not tender to swollen.  There is no erythema, exudate, or streaking.  See photo.  Over the right lower leg anteriorly there is a large skin tear with exposed bone.  There is no necrotic tissue.  There is no erythema, exudate, streaking, induration, or fluctuance.  See photo.  Lymphadenopathy:     Cervical: No cervical adenopathy.  Neurological:     Mental Status: She is alert.     Lab Results  Component Value Date   WBC 7.7 06/19/2020   HGB 13.4 06/19/2020   HCT 40.8 06/19/2020   PLT 257 06/19/2020   GLUCOSE 103 (H) 06/19/2020   CHOL 198 02/01/2020   TRIG 122 02/01/2020   HDL 51 02/01/2020   LDLDIRECT 128.9 04/08/2011   LDLCALC 123 (H) 02/01/2020   ALT 9 06/19/2020   AST 11 (L) 06/19/2020   NA 141 06/19/2020   K 4.5 06/19/2020   CL 106 06/19/2020   CREATININE 1.15 (H) 06/19/2020   BUN 16 06/19/2020   CO2 27 06/19/2020   TSH 1.850 06/19/2020   INR 1.0 01/31/2020   HGBA1C 4.7 (L) 02/01/2020    MR BRAIN WO CONTRAST  Result Date: 06/19/2020 CLINICAL DATA:  Dizziness, nonspecific.  Prominent dental implants. EXAM: MRI HEAD WITHOUT CONTRAST TECHNIQUE: Multiplanar, multiecho pulse sequences of the brain and surrounding structures were obtained without intravenous contrast. COMPARISON:  CTA head and neck 02/01/2020. MR head without and with contrast 01/30/2021 FINDINGS: Brain: Persistent diffusion signal abnormality is present  in the medial right occipital lobe. This may represent susceptibility artifact. Other areas of diffusion abnormality on the prior exam no longer present. Extensive confluent periventricular and subcortical white matter changes are present. Remote lacunar infarcts involving the left lentiform nucleus and internal capsule as well as the right thalamus are again noted. Remote lacunar infarct of the right cerebral peduncle is noted. Remote lacunar infarcts are present within the cerebellum bilaterally. No acute infarct is present. No acute or focal cortical abnormality is present. Posterior fossa somewhat obscured by dental heart artifact. The ventricles are proportionate  to the degree of atrophy. No significant extraaxial fluid collection is present. White matter changes extend into the brainstem Vascular: Flow is present in the major intracranial arteries. Skull and upper cervical spine: The craniocervical junction is normal. Upper cervical spine is within normal limits. Marrow signal is unremarkable. Sinuses/Orbits: Sinuses are obscured by artifact. Bilateral lens replacements are noted. Globes and orbits are otherwise unremarkable. IMPRESSION: 1. No acute intracranial abnormality or significant interval change. 2. Persistent diffusion signal abnormality in the medial right occipital lobe. This may represent susceptibility artifact. Evolution of the medial right occipital lobe infarct is otherwise as expected. 3. Remote lacunar infarcts of the left lentiform nucleus and internal capsule as well as the right thalamus are again noted. 4. Extensive confluent periventricular and subcortical white matter disease bilaterally is moderately advanced for age. This likely reflects the sequela of chronic microvascular ischemia. Electronically Signed   By: San Morelle M.D.   On: 06/19/2020 17:37   DG Elbow Complete Right  Result Date: 06/22/2020 CLINICAL DATA:  Trauma EXAM: RIGHT ELBOW - COMPLETE 3+ VIEW  COMPARISON:  None. FINDINGS: There is no evidence of fracture, dislocation, or joint effusion. There is no evidence of arthropathy or other focal bone abnormality. Soft tissues are unremarkable. IMPRESSION: Negative. Electronically Signed   By: Donavan Foil M.D.   On: 06/22/2020 16:07   DG Tibia/Fibula Right  Result Date: 06/22/2020 CLINICAL DATA:  Right lower extremity trauma laceration EXAM: RIGHT TIBIA AND FIBULA - 2 VIEW COMPARISON:  None. FINDINGS: There is no evidence of fracture or other focal bone lesions. Soft tissue defect anteriorly at the distal lower leg presumably corresponding to history of laceration. No radiopaque foreign body. IMPRESSION: No acute osseous abnormality. Electronically Signed   By: Donavan Foil M.D.   On: 06/22/2020 16:06    Assessment & Plan:   Zhania was seen today for wound check.  Diagnoses and all orders for this visit:  Elbow injury, right, sequela -     DG Elbow Complete Right; Future -     oxyCODONE (OXY IR/ROXICODONE) 5 MG immediate release tablet; Take 1 tablet (5 mg total) by mouth every 4 (four) hours as needed for up to 7 days for severe pain. -     promethazine (PHENERGAN) 12.5 MG tablet; Take 1 tablet (12.5 mg total) by mouth every 6 (six) hours as needed for nausea or vomiting.  Crushing injury of right lower leg, initial encounter- The injury is painful.  I recommended that she take oxycodone as needed.  She will use promethazine for any nausea or vomiting associated with the oxycodone.  She will elevate the right lower extremity as much as possible. -     DG Tibia/Fibula Right; Future -     oxyCODONE (OXY IR/ROXICODONE) 5 MG immediate release tablet; Take 1 tablet (5 mg total) by mouth every 4 (four) hours as needed for up to 7 days for severe pain. -     promethazine (PHENERGAN) 12.5 MG tablet; Take 1 tablet (12.5 mg total) by mouth every 6 (six) hours as needed for nausea or vomiting.  Skin tear of right lower leg without complication,  sequela- I am concerned she may need to have this surgically repaired or even grafted.  I recommended that she see plastic surgery as soon as possible. -     Ambulatory referral to Plastic Surgery -     oxyCODONE (OXY IR/ROXICODONE) 5 MG immediate release tablet; Take 1 tablet (5 mg total) by mouth every 4 (four)  hours as needed for up to 7 days for severe pain. -     promethazine (PHENERGAN) 12.5 MG tablet; Take 1 tablet (12.5 mg total) by mouth every 6 (six) hours as needed for nausea or vomiting.   I am having Jocelyn [name] start on oxyCODONE and promethazine. I am also having her maintain her solifenacin, DULoxetine, atorvastatin, clopidogrel, ASPIRIN 81 PO, memantine, and bacitracin. We will continue to administer cyanocobalamin.  Meds ordered this encounter  Medications  . oxyCODONE (OXY IR/ROXICODONE) 5 MG immediate release tablet    Sig: Take 1 tablet (5 mg total) by mouth every 4 (four) hours as needed for up to 7 days for severe pain.    Dispense:  35 tablet    Refill:  0  . promethazine (PHENERGAN) 12.5 MG tablet    Sig: Take 1 tablet (12.5 mg total) by mouth every 6 (six) hours as needed for nausea or vomiting.    Dispense:  30 tablet    Refill:  0     Follow-up: Return in about 3 weeks (around 07/13/2020).  Scarlette Calico, MD

## 2020-06-22 NOTE — Telephone Encounter (Signed)
Pt verified by name and DOB, results given per provider, voiced understanding all question answered. They will fu with PCP.

## 2020-06-22 NOTE — Patient Instructions (Signed)
Wound Care, Adult Taking care of your wound properly can help to prevent pain, infection, and scarring. It can also help your wound to heal more quickly. How to care for your wound Wound care      Follow instructions from your health care provider about how to take care of your wound. Make sure you: ? Wash your hands with soap and water before you change the bandage (dressing). If soap and water are not available, use hand sanitizer. ? Change your dressing as told by your health care provider. ? Leave stitches (sutures), skin glue, or adhesive strips in place. These skin closures may need to stay in place for 2 weeks or longer. If adhesive strip edges start to loosen and curl up, you may trim the loose edges. Do not remove adhesive strips completely unless your health care provider tells you to do that.  Check your wound area every day for signs of infection. Check for: ? Redness, swelling, or pain. ? Fluid or blood. ? Warmth. ? Pus or a bad smell.  Ask your health care provider if you should clean the wound with mild soap and water. Doing this may include: ? Using a clean towel to pat the wound dry after cleaning it. Do not rub or scrub the wound. ? Applying a cream or ointment. Do this only as told by your health care provider. ? Covering the incision with a clean dressing.  Ask your health care provider when you can leave the wound uncovered.  Keep the dressing dry until your health care provider says it can be removed. Do not take baths, swim, use a hot tub, or do anything that would put the wound underwater until your health care provider approves. Ask your health care provider if you can take showers. You may only be allowed to take sponge baths. Medicines   If you were prescribed an antibiotic medicine, cream, or ointment, take or use the antibiotic as told by your health care provider. Do not stop taking or using the antibiotic even if your condition improves.  Take  over-the-counter and prescription medicines only as told by your health care provider. If you were prescribed pain medicine, take it 30 or more minutes before you do any wound care or as told by your health care provider. General instructions  Return to your normal activities as told by your health care provider. Ask your health care provider what activities are safe.  Do not scratch or pick at the wound.  Do not use any products that contain nicotine or tobacco, such as cigarettes and e-cigarettes. These may delay wound healing. If you need help quitting, ask your health care provider.  Keep all follow-up visits as told by your health care provider. This is important.  Eat a diet that includes protein, vitamin A, vitamin C, and other nutrient-rich foods to help the wound heal. ? Foods rich in protein include meat, dairy, beans, nuts, and other sources. ? Foods rich in vitamin A include carrots and dark green, leafy vegetables. ? Foods rich in vitamin C include citrus, tomatoes, and other fruits and vegetables. ? Nutrient-rich foods have protein, carbohydrates, fat, vitamins, or minerals. Eat a variety of healthy foods including vegetables, fruits, and whole grains. Contact a health care provider if:  You received a tetanus shot and you have swelling, severe pain, redness, or bleeding at the injection site.  Your pain is not controlled with medicine.  You have redness, swelling, or pain around the wound.    You have fluid or blood coming from the wound.  Your wound feels warm to the touch.  You have pus or a bad smell coming from the wound.  You have a fever or chills.  You are nauseous or you vomit.  You are dizzy. Get help right away if:  You have a red streak going away from your wound.  The edges of the wound open up and separate.  Your wound is bleeding, and the bleeding does not stop with gentle pressure.  You have a rash.  You faint.  You have trouble  breathing. Summary  Always wash your hands with soap and water before changing your bandage (dressing).  To help with healing, eat foods that are rich in protein, vitamin A, vitamin C, and other nutrients.  Check your wound every day for signs of infection. Contact your health care provider if you suspect that your wound is infected. This information is not intended to replace advice given to you by your health care provider. Make sure you discuss any questions you have with your health care provider. Document Revised: 11/16/2018 Document Reviewed: 02/13/2016 Elsevier Patient Education  2020 [employer] Inc[employer]  

## 2020-06-22 NOTE — Telephone Encounter (Signed)
-----   Message from Frann Rider, NP sent at 06/21/2020  8:12 AM EST ----- Please advise patient that recent lab work all within normal limits but B12 remains on the lower side of normal despite receiving B12 injections.  Please advise her to follow-up with her PCP for further evaluation or treatment options

## 2020-06-23 ENCOUNTER — Ambulatory Visit
Admission: RE | Admit: 2020-06-23 | Discharge: 2020-06-23 | Disposition: A | Discharge status: Home / Self Care | Payer: Medicare Other | Source: Ambulatory Visit | Attending: Adult Health | Admitting: Adult Health

## 2020-06-23 DIAGNOSIS — R4189 Other symptoms and signs involving cognitive functions and awareness: Secondary | ICD-10-CM | POA: Diagnosis not present

## 2020-06-23 MED ORDER — GADOBENATE DIMEGLUMINE 529 MG/ML IV SOLN
14.0000 mL | Freq: Once | INTRAVENOUS | Status: AC | PRN
  Administered 2020-06-23: 14 mL via INTRAVENOUS

## 2020-06-25 ENCOUNTER — Encounter: Payer: Self-pay | Admitting: Internal Medicine

## 2020-06-26 ENCOUNTER — Ambulatory Visit (INDEPENDENT_AMBULATORY_CARE_PROVIDER_SITE_OTHER): Payer: Medicare Other | Admitting: Neurology

## 2020-06-26 ENCOUNTER — Encounter: Payer: Self-pay | Admitting: Internal Medicine

## 2020-06-26 ENCOUNTER — Other Ambulatory Visit: Payer: Self-pay

## 2020-06-26 ENCOUNTER — Telehealth: Payer: Self-pay | Admitting: Internal Medicine

## 2020-06-26 ENCOUNTER — Ambulatory Visit: Payer: Medicare Other

## 2020-06-26 ENCOUNTER — Ambulatory Visit (INDEPENDENT_AMBULATORY_CARE_PROVIDER_SITE_OTHER): Payer: Medicare Other | Admitting: Internal Medicine

## 2020-06-26 VITALS — BP 140/88 | HR 97 | Temp 98.7°F | Wt 147.8 lb

## 2020-06-26 DIAGNOSIS — R410 Disorientation, unspecified: Secondary | ICD-10-CM

## 2020-06-26 DIAGNOSIS — I639 Cerebral infarction, unspecified: Secondary | ICD-10-CM

## 2020-06-26 DIAGNOSIS — E538 Deficiency of other specified B group vitamins: Secondary | ICD-10-CM | POA: Diagnosis not present

## 2020-06-26 DIAGNOSIS — L089 Local infection of the skin and subcutaneous tissue, unspecified: Secondary | ICD-10-CM

## 2020-06-26 DIAGNOSIS — G63 Polyneuropathy in diseases classified elsewhere: Secondary | ICD-10-CM

## 2020-06-26 DIAGNOSIS — R4189 Other symptoms and signs involving cognitive functions and awareness: Secondary | ICD-10-CM

## 2020-06-26 DIAGNOSIS — I63531 Cerebral infarction due to unspecified occlusion or stenosis of right posterior cerebral artery: Secondary | ICD-10-CM

## 2020-06-26 DIAGNOSIS — T148XXA Other injury of unspecified body region, initial encounter: Secondary | ICD-10-CM | POA: Diagnosis not present

## 2020-06-26 MED ORDER — NUZYRA 150 MG PO TABS: 2.0000 | ORAL_TABLET | Freq: Every day | ORAL | 0 refills | Status: AC

## 2020-06-26 MED ORDER — NUZYRA 150 MG PO TABS: 3.0000 | ORAL_TABLET | Freq: Every day | ORAL | 0 refills | Status: AC

## 2020-06-26 NOTE — Patient Instructions (Signed)
Wound Infection °A wound infection happens when tiny organisms (microorganisms) start to grow in a wound. A wound infection is most often caused by bacteria. Infection can cause the wound to break open or worsen. Wound infection needs treatment. If a wound infection is left untreated, complications can occur. Untreated wound infections may lead to an infection in the bloodstream (septicemia) or a bone infection (osteomyelitis). °What are the causes? °This condition is most often caused by bacteria growing in a wound. Other microorganisms, like yeast and fungi, can also cause wound infections. °What increases the risk? °The following factors may make you more likely to develop this condition: °· Having a weak body defense system (immune system). °· Having diabetes. °· Taking steroid medicines for a long time (chronic use). °· Smoking. °· Being an older person. °· Being overweight. °· Taking chemotherapy medicines. °What are the signs or symptoms? °Symptoms of this condition include: °· Having more redness, swelling, or pain at the wound site. °· Having more blood or fluid at the wound site. °· A bad smell coming from a wound or bandage (dressing). °· Having a fever. °· Feeling tired or fatigued. °· Having warmth at or around the wound. °· Having pus at the wound site. °How is this diagnosed? °This condition is diagnosed with a medical history and physical exam. You may also have a wound culture or blood tests or both. °How is this treated? °This condition is usually treated with an antibiotic medicine. °· The infection should improve 24-48 hours after you start antibiotics. °· After 24-48 hours, redness around the wound should stop spreading, and the wound should be less painful. °Follow these instructions at home: °Medicines °· Take or apply over-the-counter and prescription medicines only as told by your health care provider. °· If you were prescribed an antibiotic medicine, take or apply it as told by your health  care provider. Do not stop using the antibiotic even if you start to feel better. °Wound care ° °· Clean the wound each day, or as told by your health care provider. °? Wash the wound with mild soap and water. °? Rinse the wound with water to remove all soap. °? Pat the wound dry with a clean towel. Do not rub it. °· Follow instructions from your health care provider about how to take care of your wound. Make sure you: °? Wash your hands with soap and water before and after you change your dressing. If soap and water are not available, use hand sanitizer. °? Change your dressing as told by your health care provider. °? Leave stitches (sutures), skin glue, or adhesive strips in place if your wound has been closed. These skin closures may need to stay in place for 2 weeks or longer. If adhesive strip edges start to loosen and curl up, you may trim the loose edges. Do not remove adhesive strips completely unless your health care provider tells you to do that. Some wounds are left open to heal on their own. °· Check your wound every day for signs of infection. Watch for: °? More redness, swelling, or pain. °? More fluid or blood. °? Warmth. °? Pus or a bad smell. °General instructions °· Keep the dressing dry until your health care provider says it can be removed. °· Do not take baths, swim, or use a hot tub until your health care provider approves. Ask your health care provider if you may take showers. You may only be allowed to take sponge baths. °· Raise (elevate)   the injured area above the level of your heart while you are sitting or lying down. °· Do not scratch or pick at the wound. °· Keep all follow-up visits as told by your health care provider. This is important. °Contact a health care provider if: °· Your pain is not controlled with medicine. °· You have more redness, swelling, or pain around your wound. °· You have more fluid or blood coming from your wound. °· Your wound feels warm to the touch. °· You have  pus coming from your wound. °· You continue to notice a bad smell coming from your wound or your dressing. °· Your wound that was closed breaks open. °Get help right away if: °· You have a red streak going away from your wound. °· You have a fever. °Summary °· A wound infection happens when tiny organisms (microorganisms) start to grow in a wound. °· This condition is usually treated with an antibiotic medicine. °· Follow instructions from your health care provider about how to take care of your wound. °· Contact a health care provider if your wound infection does not begin to improve in 24-48 hours, or your symptoms worsen. °· Keep all follow-up visits as told by your health care provider. This is important. °This information is not intended to replace advice given to you by your health care provider. Make sure you discuss any questions you have with your health care provider. °Document Revised: 03/10/2018 Document Reviewed: 03/10/2018 °Elsevier Patient Education © 2020 Elsevier Inc. ° °

## 2020-06-26 NOTE — Telephone Encounter (Signed)
Omadacycline Tosylate (NUZYRA) 150 MG TABS [facility] calling to clarification on the medication. [phone]

## 2020-06-26 NOTE — Telephone Encounter (Signed)
Ronalee Belts calling back to make [place] aware he faxed over a blank form to fill out for this medication.

## 2020-06-26 NOTE — [facility] Notes (Signed)
Subjective:  Patient ID: Jocelyn [name], female    DOB: [birth year]  Age: 86 y.o. MRN: [id number]  CC: Wound Check and Injections (b12)  This visit occurred during the SARS-CoV-2 public health emergency.  Safety protocols were in place, including screening questions prior to the visit, additional usage of staff PPE, and extensive cleaning of exam room while observing appropriate contact time as indicated for disinfecting solutions.    HPI Jocelyn [name] presents for f/up - She complains that the wound on her RLE has worsened over the last few days with redness, pain, swelling and drainage.  Outpatient Medications Prior to Visit  Medication Sig Dispense Refill   ASPIRIN 81 PO Take by mouth.     atorvastatin (LIPITOR) 40 MG tablet Take 1 tablet (40 mg total) by mouth daily. 90 tablet 0   bacitracin ointment Apply 1 application topically 2 (two) times daily. 14 g 0   clopidogrel (PLAVIX) 75 MG tablet Take 1 tablet (75 mg total) by mouth daily. 90 tablet 0   DULoxetine (CYMBALTA) 30 MG capsule TAKE ONE CAPSULE BY MOUTH ONE TIME DAILY 90 capsule 0   memantine (NAMENDA TITRATION PAK) tablet pack 5 mg/day for =1 week; 5 mg twice daily for =1 week; 15 mg/day given in 5 mg and 10 mg separated doses for =1 week; then 10 mg twice daily 49 tablet 0   oxyCODONE (OXY IR/ROXICODONE) 5 MG immediate release tablet Take 1 tablet (5 mg total) by mouth every 4 (four) hours as needed for up to 7 days for severe pain. 35 tablet 0   promethazine (PHENERGAN) 12.5 MG tablet Take 1 tablet (12.5 mg total) by mouth every 6 (six) hours as needed for nausea or vomiting. 30 tablet 0   solifenacin (VESICARE) 10 MG tablet Take 1 tablet (10 mg total) by mouth daily. 90 tablet 1   Facility-Administered Medications Prior to Visit  Medication Dose Route Frequency Provider Last Rate Last Admin   cyanocobalamin ((VITAMIN B-12)) injection 1,000 mcg  1,000 mcg Intramuscular Q30 days Janith Lima, MD   1,000 mcg at  06/26/20 1100    ROS Review of Systems  Constitutional: Negative for chills, fatigue and fever.  HENT: Negative.   Eyes: Negative for visual disturbance.  Respiratory: Negative for cough and shortness of breath.   Cardiovascular: Negative for chest pain, palpitations and leg swelling.  Gastrointestinal: Negative for abdominal pain, diarrhea, nausea and vomiting.  Endocrine: Negative.   Genitourinary: Negative.  Negative for difficulty urinating.  Musculoskeletal: Positive for arthralgias.  Skin: Positive for color change and wound. Negative for pallor and rash.  Neurological: Negative.  Negative for dizziness and weakness.  Hematological: Negative for adenopathy. Does not bruise/bleed easily.  Psychiatric/Behavioral: Negative.     Objective:  BP 140/88 (BP Location: Left Arm, Patient Position: Sitting, Cuff Size: Normal)    Pulse 97    Temp 98.7 F (37.1 C) (Oral)    Wt 147 lb 12.8 oz (67 kg)    SpO2 97%    BMI 24.60 kg/m   BP Readings from Last 3 Encounters:  06/26/20 140/88  06/22/20 116/70  06/19/20 (!) 174/90    Wt Readings from Last 3 Encounters:  06/26/20 147 lb 12.8 oz (67 kg)  06/22/20 149 lb (67.6 kg)  06/19/20 150 lb (68 kg)    Physical Exam Constitutional:      General: She is not in acute distress.    Appearance: She is not toxic-appearing or diaphoretic.  HENT:  Nose: Nose normal.     Mouth/Throat:     Mouth: Mucous membranes are moist.  Eyes:     General: No scleral icterus.    Conjunctiva/sclera: Conjunctivae normal.  Cardiovascular:     Rate and Rhythm: Normal rate and regular rhythm.     Heart sounds: No murmur heard.   Pulmonary:     Effort: Pulmonary effort is normal.     Breath sounds: No stridor. No wheezing, rhonchi or rales.  Abdominal:     General: Abdomen is flat. There is no distension.     Palpations: Abdomen is soft. There is no hepatomegaly, splenomegaly or mass.  Musculoskeletal:     Cervical back: Neck supple.        Legs:  Lymphadenopathy:     Cervical: No cervical adenopathy.  Neurological:     Mental Status: She is alert.     Lab Results  Component Value Date   WBC 7.7 06/19/2020   HGB 13.4 06/19/2020   HCT 40.8 06/19/2020   PLT 257 06/19/2020   GLUCOSE 103 (H) 06/19/2020   CHOL 198 02/01/2020   TRIG 122 02/01/2020   HDL 51 02/01/2020   LDLDIRECT 128.9 04/08/2011   LDLCALC 123 (H) 02/01/2020   ALT 9 06/19/2020   AST 11 (L) 06/19/2020   NA 141 06/19/2020   K 4.5 06/19/2020   CL 106 06/19/2020   CREATININE 1.15 (H) 06/19/2020   BUN 16 06/19/2020   CO2 27 06/19/2020   TSH 1.850 06/19/2020   INR 1.0 01/31/2020   HGBA1C 4.7 (L) 02/01/2020    MR BRAIN W WO CONTRAST  Result Date: 06/24/2020  [facility] [address], [place], [place] [address] [phone] NEUROIMAGING REPORT STUDY DATE: 06/23/2020 PATIENT NAME: Jocelyn [name] DOB: 12-09-1931 MRN: [id number] EXAM: MRI Brain with and without contrast ORDERING CLINICIAN: Frann Rider, NP CLINICAL HISTORY: 86 year old woman with cognitive decline COMPARISON FILMS: MRI of the head (without contrast) 06/19/2020 and MRI of the brain with and without contrast 01/31/2020 TECHNIQUE:MRI of the brain with and without contrast was obtained utilizing 5 mm axial slices with T1, T2, T2 flair, SWI and diffusion weighted views.  T1 sagittal, T2 coronal and postcontrast views in the axial and coronal plane were obtained. CONTRAST: 14 ml [facility] IMAGING SITE: [facility], [place]. FINDINGS: On sagittal images, the spinal cord is imaged caudally to C4 and is normal in caliber.   The contents of the posterior fossa are of normal size and position.   The pituitary gland and optic chiasm appear normal.    There is mild generalized cortical atrophy, typical for age.  There are no abnormal extra-axial collections of fluid.  There is encephalomalacia in the medial right occipital lobe corresponding to the acute stroke in June  2021.  Some linear enhancements are noted within the region of encephalomalacia.  As seen on the MRI from several days ago, there continues to be increased signal on diffusion-weighted images though not as intense as June.  The mass-effect noted on the 01/31/2020 MRI has resolved.  Chronic lacunar infarctions are noted in the left basal ganglia and the right thalamus.  Additional T2/flair hyperintense foci are noted throughout the hemispheres with confluencies in the deep white matter..  Susceptibility weighted images are normal.   She has had bilateral cataract replacements.  Otherwise, the orbits appear normal.   The VIIth/VIIIth nerve complex appears normal.  The mastoid air cells appear normal.  There are mucous retention cysts in  the maxillary sinus.  The other paranasal sinuses appear normal.  Flow voids are identified within the major intracerebral arteries.    This MRI of the brain with and without contrast shows the following: 1.  Evolving stroke involving the medial right occipital lobe.  Although enhancement usually resolves within 2 months of a stroke, it may persist longer.  Mass-effect that was noted in June 2021 has resolved. 2.  Stable appearance of lacunar infarctions in the left basal ganglia and right thalamus and advanced chronic microvascular ischemic changes. INTERPRETING PHYSICIAN: Richard A. Felecia Shelling, MD, PhD, FAAN Certified in  Neuroimaging by [employer]    Assessment & Plan:   Cynthia was seen today for wound check and injections.  Diagnoses and all orders for this visit:  Acute post-traumatic wound infection, initial encounter- Culture of the exudate is pending.  Will treat the infection with a broad-spectrum antibiotic that covers gram-positive's and gram-negative's, especially strep and staph. -     Omadacycline Tosylate (NUZYRA) 150 MG TABS; Take 3 tablets by mouth daily for 1 day. -     WOUND CULTURE; Future -     Omadacycline Tosylate (NUZYRA) 150 MG TABS;  Take 2 tablets by mouth daily for 10 days. -     WOUND CULTURE   I am having Jocelyn [name] start on [place]. I am also having her maintain her solifenacin, DULoxetine, atorvastatin, clopidogrel, ASPIRIN 81 PO, memantine, bacitracin, oxyCODONE, and promethazine. We administered cyanocobalamin. We will continue to administer cyanocobalamin.  Meds ordered this encounter  Medications   Omadacycline Tosylate (NUZYRA) 150 MG TABS    Sig: Take 3 tablets by mouth daily for 1 day.    Dispense:  6 tablet    Refill:  0   Omadacycline Tosylate (NUZYRA) 150 MG TABS    Sig: Take 2 tablets by mouth daily for 10 days.    Dispense:  20 tablet    Refill:  0     Follow-up: Return in about 3 days (around 06/29/2020).  Scarlette Calico, MD

## 2020-06-26 NOTE — Telephone Encounter (Signed)
Omadacycline Tosylate (NUZYRA) 150 MG TABS  Patient has no prescription benefits (med cost over $5000  There is a Patient assistance program through [employer] or [employer]

## 2020-06-27 ENCOUNTER — Telehealth: Payer: Self-pay

## 2020-06-27 NOTE — Telephone Encounter (Signed)
-----   Message from Frann Rider, NP sent at 06/26/2020  9:38 AM EST ----- Please advise patient/granddaughter that her recent MRI did not show any new areas of stroke

## 2020-06-27 NOTE — Telephone Encounter (Signed)
Pt verified by name and DOB, given per provider, pt voiced understanding all question answered.

## 2020-06-27 NOTE — Telephone Encounter (Signed)
   Ronalee Belts from [place] states if there is a delay getting assistance from [employer], [facility] bridge program can be used to get patient a 2 day supply while waiting.

## 2020-06-28 ENCOUNTER — Telehealth: Payer: Self-pay | Admitting: Internal Medicine

## 2020-06-28 NOTE — Telephone Encounter (Signed)
Joycelyn Schmid called from [place]   While waiting on the patient assistance approval, if the patient needs a couple of days  pharmacy could do a 2 day bridge therapy with a script from Dr. Ronnald Ramp   [phone]  Joycelyn Schmid also stated could make copies of the patient assistance  form could be used for other patients also

## 2020-06-28 NOTE — Telephone Encounter (Signed)
Patients granddaughter calling again stating that she spoke with someone about putting the patient in the hospital to get antibiotics and she was wondering if that was something that could happen.  [phone]

## 2020-06-28 NOTE — Telephone Encounter (Signed)
Omadacycline Tosylate (NUZYRA) 150 MG TABS Patients granddaughter calling to ask a question about this medication [phone]

## 2020-06-28 NOTE — Telephone Encounter (Signed)
The patient assistance form was not received yesterday. I have reached out to the pharmacy and have provided the "[employer]" fax number to have it come directly to me.   Dr. Ronnald Ramp please advise on granddaughters hospital question. Thanks!

## 2020-06-28 NOTE — Telephone Encounter (Signed)
Granddaughter notified that if she wants IV abx for pt, she will have to go to ER for hospital admission.  Contact verb understanding & states that she will plan to do that today vs using the bridge program for oral abx.

## 2020-06-29 ENCOUNTER — Ambulatory Visit: Payer: Medicare Other | Admitting: Internal Medicine

## 2020-06-29 ENCOUNTER — Inpatient Hospital Stay (HOSPITAL_COMMUNITY)
Admission: EM | Admit: 2020-06-29 | Discharge: 2020-07-01 | DRG: 603 | Disposition: A | Discharge status: Home Health | Payer: Medicare Other | Attending: Internal Medicine | Admitting: Internal Medicine

## 2020-06-29 ENCOUNTER — Encounter (HOSPITAL_COMMUNITY): Payer: Self-pay

## 2020-06-29 ENCOUNTER — Other Ambulatory Visit: Payer: Self-pay

## 2020-06-29 ENCOUNTER — Emergency Department (HOSPITAL_COMMUNITY): Payer: Medicare Other

## 2020-06-29 ENCOUNTER — Encounter: Payer: Self-pay | Admitting: Internal Medicine

## 2020-06-29 DIAGNOSIS — L03818 Cellulitis of other sites: Secondary | ICD-10-CM

## 2020-06-29 DIAGNOSIS — T148XXA Other injury of unspecified body region, initial encounter: Secondary | ICD-10-CM

## 2020-06-29 DIAGNOSIS — Z823 Family history of stroke: Secondary | ICD-10-CM

## 2020-06-29 DIAGNOSIS — Z90710 Acquired absence of both cervix and uterus: Secondary | ICD-10-CM

## 2020-06-29 DIAGNOSIS — L0390 Cellulitis, unspecified: Secondary | ICD-10-CM | POA: Diagnosis present

## 2020-06-29 DIAGNOSIS — Z91120 Patient's intentional underdosing of medication regimen due to financial hardship: Secondary | ICD-10-CM

## 2020-06-29 DIAGNOSIS — Z8673 Personal history of transient ischemic attack (TIA), and cerebral infarction without residual deficits: Secondary | ICD-10-CM

## 2020-06-29 DIAGNOSIS — Y929 Unspecified place or not applicable: Secondary | ICD-10-CM

## 2020-06-29 DIAGNOSIS — R296 Repeated falls: Secondary | ICD-10-CM | POA: Diagnosis present

## 2020-06-29 DIAGNOSIS — Z91048 Other nonmedicinal substance allergy status: Secondary | ICD-10-CM

## 2020-06-29 DIAGNOSIS — Z7982 Long term (current) use of aspirin: Secondary | ICD-10-CM

## 2020-06-29 DIAGNOSIS — Z66 Do not resuscitate: Secondary | ICD-10-CM | POA: Diagnosis present

## 2020-06-29 DIAGNOSIS — S81801A Unspecified open wound, right lower leg, initial encounter: Secondary | ICD-10-CM | POA: Diagnosis not present

## 2020-06-29 DIAGNOSIS — E785 Hyperlipidemia, unspecified: Secondary | ICD-10-CM | POA: Diagnosis not present

## 2020-06-29 DIAGNOSIS — F0390 Unspecified dementia without behavioral disturbance: Secondary | ICD-10-CM | POA: Diagnosis not present

## 2020-06-29 DIAGNOSIS — L089 Local infection of the skin and subcutaneous tissue, unspecified: Secondary | ICD-10-CM | POA: Diagnosis not present

## 2020-06-29 DIAGNOSIS — I129 Hypertensive chronic kidney disease with stage 1 through stage 4 chronic kidney disease, or unspecified chronic kidney disease: Secondary | ICD-10-CM | POA: Diagnosis present

## 2020-06-29 DIAGNOSIS — E538 Deficiency of other specified B group vitamins: Secondary | ICD-10-CM | POA: Diagnosis present

## 2020-06-29 DIAGNOSIS — L03115 Cellulitis of right lower limb: Secondary | ICD-10-CM | POA: Diagnosis not present

## 2020-06-29 DIAGNOSIS — F32A Depression, unspecified: Secondary | ICD-10-CM | POA: Diagnosis present

## 2020-06-29 DIAGNOSIS — Z20822 Contact with and (suspected) exposure to covid-19: Secondary | ICD-10-CM | POA: Diagnosis not present

## 2020-06-29 DIAGNOSIS — Z79899 Other long term (current) drug therapy: Secondary | ICD-10-CM

## 2020-06-29 DIAGNOSIS — Z833 Family history of diabetes mellitus: Secondary | ICD-10-CM

## 2020-06-29 DIAGNOSIS — N1832 Chronic kidney disease, stage 3b: Secondary | ICD-10-CM | POA: Diagnosis present

## 2020-06-29 DIAGNOSIS — Z7902 Long term (current) use of antithrombotics/antiplatelets: Secondary | ICD-10-CM

## 2020-06-29 DIAGNOSIS — T368X6A Underdosing of other systemic antibiotics, initial encounter: Secondary | ICD-10-CM | POA: Diagnosis present

## 2020-06-29 DIAGNOSIS — Z9181 History of falling: Secondary | ICD-10-CM

## 2020-06-29 DIAGNOSIS — G63 Polyneuropathy in diseases classified elsewhere: Secondary | ICD-10-CM | POA: Diagnosis present

## 2020-06-29 DIAGNOSIS — Z87891 Personal history of nicotine dependence: Secondary | ICD-10-CM

## 2020-06-29 DIAGNOSIS — Z885 Allergy status to narcotic agent status: Secondary | ICD-10-CM

## 2020-06-29 DIAGNOSIS — N3281 Overactive bladder: Secondary | ICD-10-CM | POA: Diagnosis present

## 2020-06-29 LAB — CBC WITH DIFFERENTIAL/PLATELET
Abs Immature Granulocytes: 0.03 10*3/uL (ref 0.00–0.07)
Basophils Absolute: 0.1 10*3/uL (ref 0.0–0.1)
Basophils Relative: 1 %
Eosinophils Absolute: 0.5 10*3/uL (ref 0.0–0.5)
Eosinophils Relative: 6 %
HCT: 41.4 % (ref 36.0–46.0)
Hemoglobin: 13.8 g/dL (ref 12.0–15.0)
Immature Granulocytes: 0 %
Lymphocytes Relative: 18 %
Lymphs Abs: 1.4 10*3/uL (ref 0.7–4.0)
MCH: 30.1 pg (ref 26.0–34.0)
MCHC: 33.3 g/dL (ref 30.0–36.0)
MCV: 90.2 fL (ref 80.0–100.0)
Monocytes Absolute: 0.8 10*3/uL (ref 0.1–1.0)
Monocytes Relative: 9 %
Neutro Abs: 5.3 10*3/uL (ref 1.7–7.7)
Neutrophils Relative %: 66 %
Platelets: 344 10*3/uL (ref 150–400)
RBC: 4.59 MIL/uL (ref 3.87–5.11)
RDW: 12.4 % (ref 11.5–15.5)
WBC: 8.1 10*3/uL (ref 4.0–10.5)
nRBC: 0 % (ref 0.0–0.2)

## 2020-06-29 LAB — BASIC METABOLIC PANEL
Anion gap: 10 (ref 5–15)
BUN: 15 mg/dL (ref 8–23)
CO2: 23 mmol/L (ref 22–32)
Calcium: 9.9 mg/dL (ref 8.9–10.3)
Chloride: 105 mmol/L (ref 98–111)
Creatinine, Ser: 1.03 mg/dL — ABNORMAL HIGH (ref 0.44–1.00)
GFR, Estimated: 53 mL/min — ABNORMAL LOW (ref >60)
Glucose, Bld: 113 mg/dL — ABNORMAL HIGH (ref 70–99)
Potassium: 4.7 mmol/L (ref 3.5–5.1)
Sodium: 138 mmol/L (ref 135–145)

## 2020-06-29 LAB — SEDIMENTATION RATE: Sed Rate: 3 mm/hr (ref 0–22)

## 2020-06-29 LAB — WOUND CULTURE: RESULT:: NO GROWTH

## 2020-06-29 LAB — RESP PANEL BY RT-PCR (FLU A&B, COVID) ARPGX2
Influenza A by PCR: NEGATIVE
Influenza B by PCR: NEGATIVE
SARS Coronavirus 2 by RT PCR: NEGATIVE

## 2020-06-29 LAB — C-REACTIVE PROTEIN: CRP: <0.5 mg/dL (ref <1.0)

## 2020-06-29 MED ORDER — DIPHENHYDRAMINE HCL 25 MG PO CAPS
25.0000 mg | ORAL_CAPSULE | Freq: Once | ORAL | Status: DC
  Filled 2020-06-29: qty 1

## 2020-06-29 MED ORDER — SODIUM CHLORIDE 0.9 % IV SOLN
1.0000 g | INTRAVENOUS | Status: DC
  Filled 2020-06-29: qty 10

## 2020-06-29 MED ORDER — VANCOMYCIN HCL 1250 MG/250ML IV SOLN
1250.0000 mg | Freq: Once | INTRAVENOUS | Status: AC
  Administered 2020-06-29: 1250 mg via INTRAVENOUS
  Filled 2020-06-29: qty 250

## 2020-06-29 MED ORDER — DULOXETINE HCL 30 MG PO CPEP
30.0000 mg | ORAL_CAPSULE | Freq: Every day | ORAL | Status: DC
  Administered 2020-06-30 – 2020-07-01 (×2): 30 mg via ORAL
  Filled 2020-06-29 (×2): qty 1

## 2020-06-29 MED ORDER — ATORVASTATIN CALCIUM 40 MG PO TABS
40.0000 mg | ORAL_TABLET | Freq: Every day | ORAL | Status: DC
  Administered 2020-06-30 – 2020-07-01 (×2): 40 mg via ORAL
  Filled 2020-06-29 (×2): qty 1

## 2020-06-29 MED ORDER — CLOPIDOGREL BISULFATE 75 MG PO TABS
75.0000 mg | ORAL_TABLET | Freq: Every day | ORAL | Status: DC
  Administered 2020-06-30 – 2020-07-01 (×2): 75 mg via ORAL
  Filled 2020-06-29 (×2): qty 1

## 2020-06-29 MED ORDER — ENOXAPARIN SODIUM 40 MG/0.4ML ~~LOC~~ SOLN
40.0000 mg | SUBCUTANEOUS | Status: DC
  Administered 2020-06-29 – 2020-06-30 (×2): 40 mg via SUBCUTANEOUS
  Filled 2020-06-29 (×2): qty 0.4

## 2020-06-29 MED ORDER — SODIUM CHLORIDE 0.9 % IV SOLN
1.0000 g | Freq: Once | INTRAVENOUS | Status: AC
  Administered 2020-06-29: 1 g via INTRAVENOUS
  Filled 2020-06-29: qty 10

## 2020-06-29 MED ORDER — DARIFENACIN HYDROBROMIDE ER 15 MG PO TB24
15.0000 mg | ORAL_TABLET | Freq: Every day | ORAL | Status: DC
  Administered 2020-06-30 – 2020-07-01 (×2): 15 mg via ORAL
  Filled 2020-06-29 (×2): qty 1

## 2020-06-29 MED ORDER — MEMANTINE HCL 10 MG PO TABS
5.0000 mg | ORAL_TABLET | Freq: Two times a day (BID) | ORAL | Status: DC
  Administered 2020-06-29 – 2020-07-01 (×4): 5 mg via ORAL
  Filled 2020-06-29 (×4): qty 1

## 2020-06-29 NOTE — ED Triage Notes (Signed)
Pt arrived via walk in for wounde check on right leg. States she fell getting into truck x3 weeks ago and injury wont heal all the way.

## 2020-06-29 NOTE — [facility] Notes (Signed)
Report attempted. Nurse will call back.

## 2020-06-29 NOTE — H&P (Signed)
History and Physical    Kassidi Elza KGU:[id number] DOB: 11-17-1931 DOA: 06/29/2020  PCP: Janith Lima, MD  Patient coming from: Home  Chief Complaint: leg wound  HPI: Jocelyn [name] is a 86 y.o. female with medical history significant of dementia, HLD, CVA.  Presenting with leg wound. A few weeks ago, she fell and hurt her leg. She didn't tell anyone about it for 3 days. When her granddaughter to her to her neurology appt, it was noted that she had an open leg wound. She came to the ED after and she was evaluated. It was felt that he leg was not infected, so she was sent home. After a few days when the wound did not look like it was healing, she was taken to her PCP. She was prescribed omadacycline. She was not able to afford the prescription. She followed up again with her PCP and it was recommended that she come to the ED for IV antibiotics.   ED Course: She was started on vanc, rocephin. TRH was called for admission.   Review of Systems:  Denies CP, N, V, ab pain, palpitations, dyspnea, fevers. Review of systems is otherwise negative for all not mentioned in HPI.   PMHx Past Medical History:  Diagnosis Date  . HTN (hypertension)   . Hyperlipidemia   . Urinary incontinence     PSHx Past Surgical History:  Procedure Laterality Date  . ABDOMINAL HYSTERECTOMY    . TONSILLECTOMY      SocHx  reports that she has quit smoking. She quit smokeless tobacco use about 3 years ago. She reports current alcohol use of about 14.0 standard drinks of alcohol per week. She reports that she does not use drugs.  Allergies  Allergen Reactions  . Codeine Itching and Nausea And Vomiting  . Tape Rash    Tears the skin    FamHx Family History  Problem Relation Age of Onset  . Diabetes Other        1st degree relative  . Hyperlipidemia Other   . Stroke Other        1st degree Female relative <50  . Cancer Neg Hx   . Early death Neg Hx   . Heart disease Neg Hx   . Hypertension Neg Hx   .  Kidney disease Neg Hx     Prior to Admission medications   Medication Sig Start Date End Date Taking? Authorizing Provider  atorvastatin (LIPITOR) 40 MG tablet Take 1 tablet (40 mg total) by mouth daily. 04/28/20 07/27/20 Yes Janith Lima, MD  clopidogrel (PLAVIX) 75 MG tablet Take 1 tablet (75 mg total) by mouth daily. 04/28/20 07/27/20 Yes Janith Lima, MD  Cyanocobalamin (B-12 COMPLIANCE INJECTION IJ) Inject 1 Dose as directed every 30 (thirty) days.   Yes [provider]  DULoxetine (CYMBALTA) 30 MG capsule TAKE ONE CAPSULE BY MOUTH ONE TIME DAILY Patient taking differently: Take 30 mg by mouth daily.  04/20/20  Yes Janith Lima, MD  memantine [facility] TITRATION PAK) tablet pack 5 mg/day for =1 week; 5 mg twice daily for =1 week; 15 mg/day given in 5 mg and 10 mg separated doses for =1 week; then 10 mg twice daily Patient taking differently: Take 5-20 mg by mouth See admin instructions. 5 mg/day for =1 week; 5 mg twice daily for =1 week; 15 mg/day given in 5 mg and 10 mg separated doses for =1 week; then 10 mg twice daily 06/19/20  Yes Frann Rider, NP  oxyCODONE (  OXY IR/ROXICODONE) 5 MG immediate release tablet Take 1 tablet (5 mg total) by mouth every 4 (four) hours as needed for up to 7 days for severe pain. 06/22/20 06/29/20 Yes Janith Lima, MD  promethazine (PHENERGAN) 12.5 MG tablet Take 1 tablet (12.5 mg total) by mouth every 6 (six) hours as needed for nausea or vomiting. 06/22/20  Yes Janith Lima, MD  solifenacin (VESICARE) 10 MG tablet Take 1 tablet (10 mg total) by mouth daily. 02/23/20  Yes Janith Lima, MD  bacitracin ointment Apply 1 application topically 2 (two) times daily. Patient not taking: Reported on 06/29/2020 06/19/20   Varney Biles, MD  Omadacycline Tosylate (NUZYRA) 150 MG TABS Take 2 tablets by mouth daily for 10 days. 06/28/20 07/08/20  Janith Lima, MD    Physical Exam: Vitals:   06/29/20 1145 06/29/20 1200 06/29/20 1215 06/29/20  1230  BP: 134/84 129/79 135/77 136/84  Pulse: 62 63 68 63  Resp: 17 15 19 13   Temp:      TempSrc:      SpO2: 99% 99% 98% 99%    General: 86 y.o. female resting in bed in NAD Eyes: PERRL, normal sclera ENMT: Nares patent w/o discharge, orophaynx clear, dentition normal, ears w/o discharge/lesions/ulcers Neck: Supple, trachea midline Cardiovascular: RRR, +S1, S2, no m/g/r, equal pulses throughout Respiratory: CTABL, no w/r/r, normal WOB GI: BS+, NDNT, no masses noted, no organomegaly noted MSK: No e/c/c Skin: RLE open wound that is non-draining Neuro: A&O x 3, no focal deficits Psyc: Appropriate interaction and affect, calm/cooperative  Labs on Admission: I have personally reviewed following labs and imaging studies  CBC: Recent Labs  Lab 06/29/20 0957  WBC 8.1  NEUTROABS 5.3  HGB 13.8  HCT 41.4  MCV 90.2  PLT 151   Basic Metabolic Panel: Recent Labs  Lab 06/29/20 0957  NA 138  K 4.7  CL 105  CO2 23  GLUCOSE 113*  BUN 15  CREATININE 1.03*  CALCIUM 9.9   GFR: Estimated Creatinine Clearance: 35.3 mL/min (A) (by C-G formula based on SCr of 1.03 mg/dL (H)). Liver Function Tests: No results for input(s): AST, ALT, ALKPHOS, BILITOT, PROT, ALBUMIN in the last 168 hours. No results for input(s): LIPASE, AMYLASE in the last 168 hours. No results for input(s): AMMONIA in the last 168 hours. Coagulation Profile: No results for input(s): INR, PROTIME in the last 168 hours. Cardiac Enzymes: No results for input(s): CKTOTAL, CKMB, CKMBINDEX, TROPONINI in the last 168 hours. BNP (last 3 results) No results for input(s): PROBNP in the last 8760 hours. HbA1C: No results for input(s): HGBA1C in the last 72 hours. CBG: No results for input(s): GLUCAP in the last 168 hours. Lipid Profile: No results for input(s): CHOL, HDL, LDLCALC, TRIG, CHOLHDL, LDLDIRECT in the last 72 hours. Thyroid Function Tests: No results for input(s): TSH, T4TOTAL, FREET4, T3FREE, THYROIDAB in the  last 72 hours. Anemia Panel: No results for input(s): VITAMINB12, FOLATE, FERRITIN, TIBC, IRON, RETICCTPCT in the last 72 hours. Urine analysis:    Component Value Date/Time   COLORURINE AMBER (A) 01/31/2020 2208   APPEARANCEUR CLEAR 01/31/2020 2208   LABSPEC 1.028 01/31/2020 2208   PHURINE 5.0 01/31/2020 2208   GLUCOSEU NEGATIVE 01/31/2020 2208   GLUCOSEU NEGATIVE 04/08/2011 1614   HGBUR NEGATIVE 01/31/2020 2208   BILIRUBINUR Positive 02/23/2020 [place] 01/31/2020 2208   PROTEINUR Positive (A) 02/23/2020 1357   PROTEINUR NEGATIVE 01/31/2020 2208   UROBILINOGEN 0.2 02/23/2020 1357   UROBILINOGEN  0.2 04/08/2011 1614   NITRITE Negative 02/23/2020 1357   NITRITE POSITIVE (A) 01/31/2020 2208   LEUKOCYTESUR Negative 02/23/2020 1357   LEUKOCYTESUR NEGATIVE 01/31/2020 2208    Radiological Exams on Admission: DG Tibia/Fibula Right  Result Date: 06/29/2020 CLINICAL DATA:  Wound infection. EXAM: RIGHT TIBIA AND FIBULA - 2 VIEW COMPARISON:  06/22/2020 FINDINGS: No fracture or bone lesion. No bone resorption to suggest osteomyelitis. Knee and ankle joints are normally aligned. Soft tissue wound noted anterior to the lower tibial shaft. No soft tissue air. IMPRESSION: 1. No fracture, bone lesion or evidence of osteomyelitis. Electronically Signed   By: Lajean Manes M.D.   On: 06/29/2020 10:59   Assessment/Plan RLE wound     - admit to observation, med-surg     - WOCN has seen, appreciate assistance     - started on vanc, rocephin; has had bright red hives noted after administration of vanc; hold further vanc for now; continue rocephin     - No white count or fevers and her wound actually looks pretty good. IV abx for now. Can likely transition to PO abx in next day or two. Will need outpt wound care follow up  HLD     - continue lipitor  Hx of CVA     - continue plavix, lipitor  Overactive bladder     - continue vesicare  Dementia Depression     - continue  namenda, cymbalta   Increasing fall frequency     - granddtr reports that she is having increased falls since her stroke     - PT/OT eval  DVT prophylaxis: lovenox  Code Status: DNR, confirmed at beside with patient and family  Family Communication: with grandtr at bedside  Consults called: None  Status is: Observation  The patient remains OBS appropriate and will d/c before 2 midnights.  Dispo: The patient is from: Home              Anticipated d/c is to: Home              Anticipated d/c date is: 1 day              Patient currently is not medically stable to d/c.  Jonnie Finner DO [facility]  If 7PM-7AM, please contact night-coverage [email or web address]  06/29/2020, 1:08 PM

## 2020-06-29 NOTE — ED Notes (Signed)
Fall bundle: Fall wrist bracelet/ yellow socks/ fall risk sign on door/ bed alarm on when patients granddaughter not with patient

## 2020-06-29 NOTE — ED Notes (Signed)
Wound cleaned with soap and water and dabbed dry. Xeroform dressing placed directly on wound. Dry gauze placed on top of Xeroform, and abd pad placed on top of dry gauze. Dressings wrapped with curlex and ace wrap.
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
## 2020-06-29 NOTE — ED Provider Notes (Signed)
[place] DEPT Provider Note   CSN: [id number] Arrival date & time: 06/29/20  3016     History Chief Complaint  Patient presents with  . Wound Check    Jocelyn [name] is a 86 y.o. female.  She had a recent stroke and had a fall 3 weeks ago.  She had some abrasions and skin tears.  There is been 1 nonhealing lesion on her right lower leg.  She saw her doctor for this and he sent her here for evaluation.  She denies any systemic illness symptoms including fever nausea vomiting.  She says the wound is been painful though.  The history is provided by the patient.  Wound Check This is a new problem. The current episode started more than 1 week ago. The problem occurs constantly. The problem has been gradually worsening. Pertinent negatives include no chest pain, no abdominal pain, no headaches and no shortness of breath. The symptoms are aggravated by walking. Nothing relieves the symptoms. She has tried rest for the symptoms. The treatment provided no relief.       Past Medical History:  Diagnosis Date  . HTN (hypertension)   . Hyperlipidemia   . Urinary incontinence     Patient Active Problem List   Diagnosis Date Noted  . Acute post-traumatic wound infection 06/26/2020  . Crushing injury of right lower leg 06/22/2020  . Elbow injury, right, sequela 06/22/2020  . Skin tear of right lower leg without complication 08/20/3233  . Dysuria 02/23/2020  . OAB (overactive bladder) 02/23/2020  . Cerebrovascular accident (CVA) due to stenosis of right posterior cerebral artery ([place]) 02/08/2020  . Stage 3b chronic kidney disease ([place]) 01/26/2020  . Current severe episode of major depressive disorder without psychotic features without prior episode ([place]) 01/25/2020  . Vitamin B12 deficiency neuropathy ([place]) 01/25/2020  . Primary osteoarthritis involving multiple joints 12/08/2017  . Thoracic aorta atherosclerosis ([place]) 12/12/2014  . Depression with anxiety  06/09/2013  . Other abnormal glucose 06/09/2013  . Routine general medical examination at a health care facility 06/09/2013  . Dyslipidemia, goal LDL below 130 09/20/2010  . Essential hypertension 12/22/2008    Past Surgical History:  Procedure Laterality Date  . ABDOMINAL HYSTERECTOMY    . TONSILLECTOMY       OB History   No obstetric history on file.     Family History  Problem Relation Age of Onset  . Diabetes Other        1st degree relative  . Hyperlipidemia Other   . Stroke Other        1st degree Female relative <50  . Cancer Neg Hx   . Early death Neg Hx   . Heart disease Neg Hx   . Hypertension Neg Hx   . Kidney disease Neg Hx     Social History   Tobacco Use  . Smoking status: Former Research scientist (life sciences)  . Smokeless tobacco: Former Systems developer    Quit date: 03/17/2017  . Tobacco comment: STATES SHE QUIT 25 YEARS AGO  Vaping Use  . Vaping Use: Never used  Substance Use Topics  . Alcohol use: Yes    Alcohol/week: 14.0 standard drinks    Types: 7 Glasses of wine, 7 Cans of beer per week  . Drug use: No    Home Medications Prior to Admission medications   Medication Sig Start Date End Date Taking? Authorizing Provider  ASPIRIN 81 PO Take by mouth.    [provider]  atorvastatin (LIPITOR) 40 MG  tablet Take 1 tablet (40 mg total) by mouth daily. 04/28/20 07/27/20  Janith Lima, MD  bacitracin ointment Apply 1 application topically 2 (two) times daily. 06/19/20   Varney Biles, MD  clopidogrel (PLAVIX) 75 MG tablet Take 1 tablet (75 mg total) by mouth daily. 04/28/20 07/27/20  Janith Lima, MD  DULoxetine (CYMBALTA) 30 MG capsule TAKE ONE CAPSULE BY MOUTH ONE TIME DAILY 04/20/20   Janith Lima, MD  memantine [facility] TITRATION PAK) tablet pack 5 mg/day for =1 week; 5 mg twice daily for =1 week; 15 mg/day given in 5 mg and 10 mg separated doses for =1 week; then 10 mg twice daily 06/19/20   Frann Rider, NP  Omadacycline Tosylate (NUZYRA) 150 MG TABS Take 2  tablets by mouth daily for 10 days. 06/28/20 07/08/20  Janith Lima, MD  oxyCODONE (OXY IR/ROXICODONE) 5 MG immediate release tablet Take 1 tablet (5 mg total) by mouth every 4 (four) hours as needed for up to 7 days for severe pain. 06/22/20 06/29/20  Janith Lima, MD  promethazine (PHENERGAN) 12.5 MG tablet Take 1 tablet (12.5 mg total) by mouth every 6 (six) hours as needed for nausea or vomiting. 06/22/20   Janith Lima, MD  solifenacin (VESICARE) 10 MG tablet Take 1 tablet (10 mg total) by mouth daily. 02/23/20   Janith Lima, MD    Allergies    Codeine and Tape  Review of Systems   Review of Systems  Constitutional: Negative for fever.  HENT: Negative for sore throat.   Eyes: Negative for visual disturbance.  Respiratory: Negative for shortness of breath.   Cardiovascular: Negative for chest pain.  Gastrointestinal: Negative for abdominal pain.  Genitourinary: Negative for dysuria.  Musculoskeletal: Negative for neck pain.  Skin: Negative for rash.  Neurological: Negative for headaches.    Physical Exam Updated Vital Signs BP (!) 136/94 (BP Location: Right Arm)   Pulse 97   Temp 97.6 F (36.4 C) (Oral)   Resp (!) 24   SpO2 99%   Physical Exam Vitals and nursing note reviewed.  Constitutional:      General: She is not in acute distress.    Appearance: She is well-developed.  HENT:     Head: Normocephalic and atraumatic.  Eyes:     Conjunctiva/sclera: Conjunctivae normal.  Cardiovascular:     Rate and Rhythm: Normal rate and regular rhythm.     Heart sounds: No murmur heard.   Pulmonary:     Effort: Pulmonary effort is normal. No respiratory distress.     Breath sounds: Normal breath sounds.  Abdominal:     Palpations: Abdomen is soft.     Tenderness: There is no abdominal tenderness.  Musculoskeletal:        General: Swelling, tenderness and signs of injury present. Normal range of motion.     Cervical back: Neck supple.     Comments: She has a  large gash to her right lower leg with some granulation tissue at the base.  There are some surrounding erythema and slight warmth.  There is no crepitus.  Skin:    General: Skin is warm and dry.     Capillary Refill: Capillary refill takes less than 2 seconds.  Neurological:     General: No focal deficit present.     Mental Status: She is alert.       ED Results / Procedures / Treatments   Labs (all labs ordered are listed, but only abnormal results  are displayed) Labs Reviewed  BASIC METABOLIC PANEL - Abnormal; Notable for the following components:      Result Value   Glucose, Bld 113 (*)    Creatinine, Ser 1.03 (*)    GFR, Estimated 53 (*)    All other components within normal limits  COMPREHENSIVE METABOLIC PANEL - Abnormal; Notable for the following components:   Creatinine, Ser 1.01 (*)    Total Protein 5.6 (*)    Albumin 3.4 (*)    AST 12 (*)    GFR, Estimated 54 (*)    All other components within normal limits  CULTURE, BLOOD (ROUTINE X 2)  CULTURE, BLOOD (ROUTINE X 2)  AEROBIC CULTURE (SUPERFICIAL SPECIMEN)  RESP PANEL BY RT-PCR (FLU A&B, COVID) ARPGX2  CBC WITH DIFFERENTIAL/PLATELET  SEDIMENTATION RATE  C-REACTIVE PROTEIN  CBC    EKG None  Radiology DG Tibia/Fibula Right  Result Date: 06/29/2020 CLINICAL DATA:  Wound infection. EXAM: RIGHT TIBIA AND FIBULA - 2 VIEW COMPARISON:  06/22/2020 FINDINGS: No fracture or bone lesion. No bone resorption to suggest osteomyelitis. Knee and ankle joints are normally aligned. Soft tissue wound noted anterior to the lower tibial shaft. No soft tissue air. IMPRESSION: 1. No fracture, bone lesion or evidence of osteomyelitis. Electronically Signed   By: Lajean Manes M.D.   On: 06/29/2020 10:59    Procedures Procedures (including critical care time)  Medications Ordered in ED Medications  diphenhydrAMINE (BENADRYL) capsule 25 mg (25 mg Oral Not Given 06/29/20 1450)  atorvastatin (LIPITOR) tablet 40 mg (has no  administration in time range)  DULoxetine (CYMBALTA) DR capsule 30 mg (has no administration in time range)  darifenacin (ENABLEX) 24 hr tablet 15 mg (has no administration in time range)  clopidogrel (PLAVIX) tablet 75 mg (has no administration in time range)  memantine (NAMENDA) tablet 5 mg (5 mg Oral Given 06/29/20 2046)  enoxaparin (LOVENOX) injection 40 mg (40 mg Subcutaneous Given 06/29/20 2047)  ceFAZolin (ANCEF) IVPB 1 g/50 mL premix (has no administration in time range)  cefTRIAXone (ROCEPHIN) 1 g in sodium chloride 0.9 % 100 mL IVPB (0 g Intravenous Stopped 06/29/20 1224)  vancomycin (VANCOREADY) IVPB 1250 mg/250 mL (0 mg Intravenous Stopped 06/29/20 1357)    ED Course  I have reviewed the triage vital signs and the nursing notes.  Pertinent labs & imaging results that were available during my care of the patient were reviewed by me and considered in my medical decision making (see chart for details).  Clinical Course as of Jun 30 1102  Thu Jun 29, 2020  1011 Reviewed notes from PCP Dr. Ronnald Ramp.  He saw her on the 15th and has put her on: Omadacycline Tosylate (NUZYRA) 150 MG TABS    Sig: Take 3 tablets by mouth daily for 1 day.   Dispense:  6 tablet   Refill:  0  Omadacycline Tosylate (NUZYRA) 150 MG TABS   Sig: Take 2 tablets by mouth daily for 10 days.   Dispense:  20 tablet   Refill:  0     [MB]  1021 It sounds like she took 2 days of the antibiotic Monday and Tuesday but due to cost has not been able to get any for the last 2 days.   [MB]  8182 X-ray of right tib-fib shows no signs of osteomyelitis in my interpretation.   [MB]    Clinical Course User Index [MB] Hayden Rasmussen, MD   MDM Rules/Calculators/A&P  This patient complains of nonhealing wound to right lower leg; this involves an extensive number of treatment Options and is a complaint that carries with it a high risk of complications and Morbidity. The differential  includes posttraumatic infection, cellulitis, osteomyelitis  I ordered, reviewed and interpreted labs, which included CBC with normal white count normal hemoglobin, chemistries normal, CRP and sed rate normal, Covid testing negative.  Wound cultures and blood cultures sent I ordered medication IV antibiotics I ordered imaging studies which included x-ray right lower leg and I independently    visualized and interpreted imaging which showed no acute signs of osteo- Additional history obtained from patient's granddaughter Previous records obtained and reviewed in epic including recent PCP visits I consulted Dr. Marylyn Ishihara [facility] and discussed lab and imaging findings  Critical Interventions: None  After the interventions stated above, I reevaluated the patient and found patient to be fairly asymptomatic other than pain.  Have placed a wound care consult also and they have left a wound care management plan.  Patient will be admitted for some IV antibiotics and hopefully set up with an outpatient regimen that would help her heal.  She is agreeable to plan   Final Clinical Impression(s) / ED Diagnoses Final diagnoses:  Wound infection    Rx / DC Orders ED Discharge Orders    None       Hayden Rasmussen, MD 06/30/20 [phone]

## 2020-06-29 NOTE — ED Notes (Signed)
Patient is able to ambulate with assistance with front wheel walker

## 2020-06-29 NOTE — ED Notes (Signed)
ED TO INPATIENT HANDOFF REPORT  Name/Age/Gender Jocelyn [name] 86 y.o. female  Code Status Code Status History    Date Active Date Inactive Code Status Order ID Comments User Context   01/31/2020 2247 02/01/2020 2143 Full Code [id number]  Athena Masse, MD ED   Advance Care Planning Activity    Questions for Most Recent Historical Code Status (Order [id number])       Home/SNF/Other Home  Chief Complaint Cellulitis [L03.90]  Level of Care/Admitting Diagnosis ED Disposition    ED Disposition Condition Comment   [facility] Area: [facility] [[id number]]  Level of Care: Med-Surg [16]  Covid Evaluation: Asymptomatic Screening Protocol (No Symptoms)  Diagnosis: Cellulitis [009233]  Admitting Physician: Jonnie Finner [[id number]]  Attending Physician: Jonnie Finner [[id number]]       Medical History Past Medical History:  Diagnosis Date  . HTN (hypertension)   . Hyperlipidemia   . Urinary incontinence     Allergies Allergies  Allergen Reactions  . Codeine Itching and Nausea And Vomiting  . Tape Rash    Tears the skin    IV Location/Drains/Wounds Patient Lines/Drains/Airways Status    Active Line/Drains/Airways    Name Placement date Placement time Site Days   Peripheral IV 06/29/20 Right Forearm 06/29/20  1043  Forearm  less than 1   Wound / Incision (Open or Dehisced) 06/29/20 Laceration;Other (Comment) Pretibial Right;Mid;Anterior 7.5x2.5x3 cm triangular shaped wound to right mid anterior shin; 2.5x2x2.5 wound below larger wound 06/29/20  1358  Pretibial  less than 1          Labs/Imaging Results for orders placed or performed during the hospital encounter of 06/29/20 (from the past 48 hour(s))  Basic metabolic panel     Status: Abnormal   Collection Time: 06/29/20  9:57 AM  Result Value Ref Range   Sodium 138 135 - 145 mmol/L   Potassium 4.7 3.5 - 5.1 mmol/L   Chloride 105 98 - 111 mmol/L   CO2 23 22 - 32 mmol/L   Glucose, Bld 113 (H) 70  - 99 mg/dL    Comment: Glucose reference range applies only to samples taken after fasting for at least 8 hours.   BUN 15 8 - 23 mg/dL   Creatinine, Ser 1.03 (H) 0.44 - 1.00 mg/dL   Calcium 9.9 8.9 - 10.3 mg/dL   GFR, Estimated 53 (L) >60 mL/min    Comment: (NOTE) Calculated using the CKD-EPI Creatinine Equation (2021)    Anion gap 10 5 - 15    Comment: Performed at [facility], [place] [address], [place], [place] [address]  CBC with Differential     Status: None   Collection Time: 06/29/20  9:57 AM  Result Value Ref Range   WBC 8.1 4.0 - 10.5 K/uL   RBC 4.59 3.87 - 5.11 MIL/uL   Hemoglobin 13.8 12.0 - 15.0 g/dL   HCT 41.4 36 - 46 %   MCV 90.2 80.0 - 100.0 fL   MCH 30.1 26.0 - 34.0 pg   MCHC 33.3 30.0 - 36.0 g/dL   RDW 12.4 11.5 - 15.5 %   Platelets 344 150 - 400 K/uL   nRBC 0.0 0.0 - 0.2 %   Neutrophils Relative % 66 %   Neutro Abs 5.3 1.7 - 7.7 K/uL   Lymphocytes Relative 18 %   Lymphs Abs 1.4 0.7 - 4.0 K/uL   Monocytes Relative 9 %   Monocytes Absolute 0.8 0.1 - 1.0 K/uL  Eosinophils Relative 6 %   Eosinophils Absolute 0.5 0.0 - 0.5 K/uL   Basophils Relative 1 %   Basophils Absolute 0.1 0.0 - 0.1 K/uL   Immature Granulocytes 0 %   Abs Immature Granulocytes 0.03 0.00 - 0.07 K/uL    Comment: Performed at [facility], [place] [address], [place], [place] [address]  Sedimentation rate     Status: None   Collection Time: 06/29/20  9:57 AM  Result Value Ref Range   Sed Rate 3 0 - 22 mm/hr    Comment: Performed at [facility], [place] [address], [place], [place] [address]  C-reactive protein     Status: None   Collection Time: 06/29/20  9:58 AM  Result Value Ref Range   CRP <0.5 <1.0 mg/dL    Comment: Performed at [facility], [place] [address], [place], [place] [address]  Wound or Superficial Culture     Status: None (Preliminary result)   Collection Time: 06/29/20  9:58 AM   Specimen: Wound  Result  Value Ref Range   Specimen Description      WOUND RT LEG Performed at [place] [address], [place], [place] [address]    Special Requests      NONE Performed at [facility], [place] [address], [place], [place] [address]    Gram Stain      NO WBC SEEN NO ORGANISMS SEEN Performed at [facility], [place] [address] [place],  [address]    Culture PENDING    Report Status PENDING   Resp Panel by RT-PCR (Flu A&B, Covid) Nasopharyngeal Swab     Status: None   Collection Time: 06/29/20 10:11 AM   Specimen: Nasopharyngeal Swab; Nasopharyngeal(NP) swabs in vial transport medium  Result Value Ref Range   SARS Coronavirus 2 by RT PCR NEGATIVE NEGATIVE    Comment: (NOTE) SARS-CoV-2 target nucleic acids are NOT DETECTED.  The SARS-CoV-2 RNA is generally detectable in upper respiratory specimens during the acute phase of infection. The lowest concentration of SARS-CoV-2 viral copies this assay can detect is 138 copies/mL. A negative result does not preclude SARS-Cov-2 infection and should not be used as the sole basis for treatment or other patient management decisions. A negative result may occur with  improper specimen collection/handling, submission of specimen other than nasopharyngeal swab, presence of viral mutation(s) within the areas targeted by this assay, and inadequate number of viral copies(<138 copies/mL). A negative result must be combined with clinical observations, patient history, and epidemiological information. The expected result is Negative.  Fact Sheet for Patients:  EntrepreneurPulse.com.au  Fact Sheet for Healthcare Providers:  IncredibleEmployment.be  This test is no t yet approved or cleared by the [place] FDA and  has been authorized for detection and/or diagnosis of SARS-CoV-2 by FDA under an Emergency Use Authorization (EUA). This EUA will remain  in effect  (meaning this test can be used) for the duration of the COVID-19 declaration under Section 564(b)(1) of the Act, 21 U.S.C.section 360bbb-3(b)(1), unless the authorization is terminated  or revoked sooner.       Influenza A by PCR NEGATIVE NEGATIVE   Influenza B by PCR NEGATIVE NEGATIVE    Comment: (NOTE) The Xpert Xpress SARS-CoV-2/FLU/RSV plus assay is intended as an aid in the diagnosis of influenza from Nasopharyngeal swab specimens and should not be used as a sole basis for treatment. Nasal washings and aspirates are unacceptable for Xpert Xpress SARS-CoV-2/FLU/RSV testing.  Fact Sheet for Patients: EntrepreneurPulse.com.au  Fact Sheet for Healthcare Providers: IncredibleEmployment.be  This test is not yet approved or cleared by the [place] FDA and has been authorized for detection and/or diagnosis of SARS-CoV-2 by FDA under an Emergency Use Authorization (EUA). This EUA will remain in effect (meaning this test can be used) for the duration of the COVID-19 declaration under Section 564(b)(1) of the Act, 21 U.S.C. section 360bbb-3(b)(1), unless the authorization is terminated or revoked.  Performed at [facility], [place] [address], [place], [place] [address]   Culture, blood (routine x 2)     Status: None (Preliminary result)   Collection Time: 06/29/20 10:20 AM   Specimen: BLOOD  Result Value Ref Range   Specimen Description      BLOOD LEFT ANTECUBITAL Performed at [facility], [place] [address], [place], [place] [address]    Special Requests      BOTTLES DRAWN AEROBIC AND ANAEROBIC Blood Culture results may not be optimal due to an inadequate volume of blood received in culture bottles Performed at [facility], [place] [address], [place], [place] [address]    Culture      NO GROWTH <12 HOURS Performed at [place] [address] [place], [place] [address]     Report Status PENDING   Culture, blood (routine x 2)     Status: None (Preliminary result)   Collection Time: 06/29/20 10:30 AM   Specimen: BLOOD  Result Value Ref Range   Specimen Description      BLOOD RIGHT WRIST Performed at [place] [address], [place], [place] [address]    Special Requests      BOTTLES DRAWN AEROBIC AND ANAEROBIC Blood Culture results may not be optimal due to an inadequate volume of blood received in culture bottles Performed at [facility], [place] [address] [place],  [address]    Culture      NO GROWTH <12 HOURS Performed at [place] [address] [place],  [address]    Report Status PENDING    DG Tibia/Fibula Right  Result Date: 06/29/2020 CLINICAL DATA:  Wound infection. EXAM: RIGHT TIBIA AND FIBULA - 2 VIEW COMPARISON:  06/22/2020 FINDINGS: No fracture or bone lesion. No bone resorption to suggest osteomyelitis. Knee and ankle joints are normally aligned. Soft tissue wound noted anterior to the lower tibial shaft. No soft tissue air. IMPRESSION: 1. No fracture, bone lesion or evidence of osteomyelitis. Electronically Signed   By: Lajean Manes M.D.   On: 06/29/2020 10:59    Pending Labs [employer] (From admission, onward)          Start     Ordered   Signed and Held  CBC  (enoxaparin (LOVENOX)    CrCl >/= 30 ml/min)  Once,   R       Comments: Baseline for enoxaparin therapy IF NOT ALREADY DRAWN.  Notify MD if PLT < 100 K.    Signed and Held   Signed and Held  Creatinine, serum  (enoxaparin (LOVENOX)    CrCl >/= 30 ml/min)  Once,   R       Comments: Baseline for enoxaparin therapy IF NOT ALREADY DRAWN.    Signed and Held   Signed and Held  Creatinine, serum  (enoxaparin (LOVENOX)    CrCl >/= 30 ml/min)  Weekly,   R     Comments: while on enoxaparin therapy  Signed and Held   Signed and Held  Comprehensive metabolic panel  Tomorrow morning,   R        Signed and Held    Signed and Held  CBC  Tomorrow morning,   R        Signed and Held          Vitals/Pain Today's Vitals   06/29/20 1515 06/29/20 1530 06/29/20 1545 06/29/20 1600  BP: (!) 144/81 (!) 144/84 (!) 142/75 136/89  Pulse: 85 88 91 89  Resp: 16 15 18 15   Temp:      TempSrc:      SpO2: 98% 98% 97% 100%  PainSc:        Isolation Precautions No active isolations  Medications Medications  diphenhydrAMINE (BENADRYL) capsule 25 mg (25 mg Oral Not Given 06/29/20 1450)  cefTRIAXone (ROCEPHIN) 1 g in sodium chloride 0.9 % 100 mL IVPB (0 g Intravenous Stopped 06/29/20 1224)  vancomycin (VANCOREADY) IVPB 1250 mg/250 mL (0 mg Intravenous Stopped 06/29/20 1357)    Mobility walks

## 2020-06-29 NOTE — ED Notes (Signed)
Pts family member came out of the room and told staff that pts forehead has turned bright red. Pt denies itching or discomfort, but has splotchy redness to her forehead. MD made aware. MD gave verbal order for PO Benadryl 25mg .

## 2020-06-29 NOTE — Progress Notes (Signed)
A consult was received from an ED physician for vancomycin per pharmacy dosing.  The patient's profile has been reviewed for ht/wt/allergies/indication/available labs.    A one time order has been placed for vancomycin 1250 mg IV once.    Further antibiotics/pharmacy consults should be ordered by admitting physician if indicated.                       Thank you, Lenis Noon, PharmD 06/29/2020  11:15 AM

## 2020-06-29 NOTE — Progress Notes (Signed)
TOC CM/CSW received a call from [facility].  Tommi Rumps stated that pt is currently receiving services , and they will accept her back when she's dc'd.  CSW will continue to follow for dc needs.  Katrece Roediger Tarpley-Carter, MSW, LCSW-A Pronouns:  She, Her, Hers                  [facility] Transitions of CareClinical Social Worker [email or web address] [phone]

## 2020-06-29 NOTE — ED Notes (Signed)
Granddaughter with patient

## 2020-06-30 DIAGNOSIS — R296 Repeated falls: Secondary | ICD-10-CM | POA: Diagnosis present

## 2020-06-30 DIAGNOSIS — Z79899 Other long term (current) drug therapy: Secondary | ICD-10-CM | POA: Diagnosis not present

## 2020-06-30 DIAGNOSIS — Z91048 Other nonmedicinal substance allergy status: Secondary | ICD-10-CM | POA: Diagnosis not present

## 2020-06-30 DIAGNOSIS — Z8673 Personal history of transient ischemic attack (TIA), and cerebral infarction without residual deficits: Secondary | ICD-10-CM | POA: Diagnosis not present

## 2020-06-30 DIAGNOSIS — S81801A Unspecified open wound, right lower leg, initial encounter: Secondary | ICD-10-CM | POA: Diagnosis not present

## 2020-06-30 DIAGNOSIS — F0390 Unspecified dementia without behavioral disturbance: Secondary | ICD-10-CM | POA: Diagnosis present

## 2020-06-30 DIAGNOSIS — L03115 Cellulitis of right lower limb: Secondary | ICD-10-CM | POA: Diagnosis present

## 2020-06-30 DIAGNOSIS — Z7982 Long term (current) use of aspirin: Secondary | ICD-10-CM | POA: Diagnosis not present

## 2020-06-30 DIAGNOSIS — I129 Hypertensive chronic kidney disease with stage 1 through stage 4 chronic kidney disease, or unspecified chronic kidney disease: Secondary | ICD-10-CM | POA: Diagnosis present

## 2020-06-30 DIAGNOSIS — Z833 Family history of diabetes mellitus: Secondary | ICD-10-CM | POA: Diagnosis not present

## 2020-06-30 DIAGNOSIS — Z66 Do not resuscitate: Secondary | ICD-10-CM | POA: Diagnosis present

## 2020-06-30 DIAGNOSIS — Z87891 Personal history of nicotine dependence: Secondary | ICD-10-CM | POA: Diagnosis not present

## 2020-06-30 DIAGNOSIS — Z9181 History of falling: Secondary | ICD-10-CM | POA: Diagnosis not present

## 2020-06-30 DIAGNOSIS — T368X6A Underdosing of other systemic antibiotics, initial encounter: Secondary | ICD-10-CM | POA: Diagnosis present

## 2020-06-30 DIAGNOSIS — Z90710 Acquired absence of both cervix and uterus: Secondary | ICD-10-CM | POA: Diagnosis not present

## 2020-06-30 DIAGNOSIS — E538 Deficiency of other specified B group vitamins: Secondary | ICD-10-CM | POA: Diagnosis present

## 2020-06-30 DIAGNOSIS — F32A Depression, unspecified: Secondary | ICD-10-CM | POA: Diagnosis present

## 2020-06-30 DIAGNOSIS — Z885 Allergy status to narcotic agent status: Secondary | ICD-10-CM | POA: Diagnosis not present

## 2020-06-30 DIAGNOSIS — N3281 Overactive bladder: Secondary | ICD-10-CM | POA: Diagnosis present

## 2020-06-30 DIAGNOSIS — Z7902 Long term (current) use of antithrombotics/antiplatelets: Secondary | ICD-10-CM | POA: Diagnosis not present

## 2020-06-30 DIAGNOSIS — Y929 Unspecified place or not applicable: Secondary | ICD-10-CM | POA: Diagnosis not present

## 2020-06-30 DIAGNOSIS — E785 Hyperlipidemia, unspecified: Secondary | ICD-10-CM | POA: Diagnosis present

## 2020-06-30 DIAGNOSIS — Z823 Family history of stroke: Secondary | ICD-10-CM | POA: Diagnosis not present

## 2020-06-30 DIAGNOSIS — Z20822 Contact with and (suspected) exposure to covid-19: Secondary | ICD-10-CM | POA: Diagnosis present

## 2020-06-30 DIAGNOSIS — N1832 Chronic kidney disease, stage 3b: Secondary | ICD-10-CM | POA: Diagnosis present

## 2020-06-30 DIAGNOSIS — L03818 Cellulitis of other sites: Secondary | ICD-10-CM | POA: Diagnosis not present

## 2020-06-30 DIAGNOSIS — G63 Polyneuropathy in diseases classified elsewhere: Secondary | ICD-10-CM | POA: Diagnosis present

## 2020-06-30 LAB — COMPREHENSIVE METABOLIC PANEL
ALT: 8 U/L (ref 0–44)
AST: 12 U/L — ABNORMAL LOW (ref 15–41)
Albumin: 3.4 g/dL — ABNORMAL LOW (ref 3.5–5.0)
Alkaline Phosphatase: 73 U/L (ref 38–126)
Anion gap: 7 (ref 5–15)
BUN: 12 mg/dL (ref 8–23)
CO2: 24 mmol/L (ref 22–32)
Calcium: 9.5 mg/dL (ref 8.9–10.3)
Chloride: 105 mmol/L (ref 98–111)
Creatinine, Ser: 1.01 mg/dL — ABNORMAL HIGH (ref 0.44–1.00)
GFR, Estimated: 54 mL/min — ABNORMAL LOW (ref >60)
Glucose, Bld: 92 mg/dL (ref 70–99)
Potassium: 4.1 mmol/L (ref 3.5–5.1)
Sodium: 136 mmol/L (ref 135–145)
Total Bilirubin: 0.6 mg/dL (ref 0.3–1.2)
Total Protein: 5.6 g/dL — ABNORMAL LOW (ref 6.5–8.1)

## 2020-06-30 LAB — CBC
HCT: 39 % (ref 36.0–46.0)
Hemoglobin: 12.7 g/dL (ref 12.0–15.0)
MCH: 30 pg (ref 26.0–34.0)
MCHC: 32.6 g/dL (ref 30.0–36.0)
MCV: 92 fL (ref 80.0–100.0)
Platelets: 299 10*3/uL (ref 150–400)
RBC: 4.24 MIL/uL (ref 3.87–5.11)
RDW: 12.5 % (ref 11.5–15.5)
WBC: 6.6 10*3/uL (ref 4.0–10.5)
nRBC: 0 % (ref 0.0–0.2)

## 2020-06-30 MED ORDER — CEFAZOLIN SODIUM-DEXTROSE 1-4 GM/50ML-% IV SOLN
1.0000 g | Freq: Three times a day (TID) | INTRAVENOUS | Status: DC
  Administered 2020-06-30 – 2020-07-01 (×3): 1 g via INTRAVENOUS
  Filled 2020-06-30 (×4): qty 50

## 2020-06-30 NOTE — Evaluation (Signed)
Physical Therapy Evaluation Only Patient Details Name: Jocelyn [name] MRN: [id number] DOB: 1932/05/12 Today's Date: 06/30/2020   History of Present Illness  86 y.o. female with PMH of dementia, HLD, CVA presents to ED with leg wound from a fall a few weeks ago.  Clinical Impression  Pt is modified independent/supervision with all mobility, only requiring initial cues for hand placement with transfer. Pt denies pain with ambulation and reports feeling like she is at baseline. No acute PT needs notified at this time.     Follow Up Recommendations No PT follow up    Equipment Recommendations  None recommended by PT    Recommendations for Other Services       Precautions / Restrictions Precautions Precautions: Fall Restrictions Weight Bearing Restrictions: No      Mobility  Bed Mobility Overal bed mobility: Modified Independent  General bed mobility comments: comes to sitting EOB with light use of bedrail    Transfers Overall transfer level: Needs assistance Equipment used: Rolling walker (2 wheeled) Transfers: Sit to/from Stand Sit to Stand: Supervision    General transfer comment: cues for hand placement to rise without pulling on RW  Ambulation/Gait Ambulation/Gait assistance: Supervision Gait Distance (Feet): 200 Feet Assistive device: Rolling walker (2 wheeled) Gait Pattern/deviations: WFL(Within Functional Limits);Step-through pattern;Decreased stride length Gait velocity: slightly decreased   General Gait Details: pt with decreased heel-toe pattern demonstrating flat foot posture throughout gait cycle without unsteadiness or near falls  Stairs            Wheelchair Mobility    Modified Rankin (Stroke Patients Only)       Balance Overall balance assessment: Needs assistance;History of Falls Sitting-balance support: Feet supported;No upper extremity supported Sitting balance-Leahy Scale: Good Sitting balance - Comments: seated EOB   Standing  balance support: During functional activity;Bilateral upper extremity supported Standing balance-Leahy Scale: Poor Standing balance comment: static fair without UE support, dynamic poor reliant on UE support            Pertinent Vitals/Pain Pain Assessment: No/denies pain    Home Living Family/patient expects to be discharged to:: Private residence Living Arrangements: Alone Available Help at Discharge: Family;Available PRN/intermittently Type of Home: House Home Access: Stairs to enter Entrance Stairs-Rails: Left Entrance Stairs-Number of Steps: 3 Home Layout: One level Home Equipment: Walker - 2 wheels;Walker - 4 wheels;Shower seat;Cane - single point;Grab bars - toilet      Prior Function Level of Independence: Independent with assistive device(s)         Comments: Pt reports using rollator or RW for ambulation inside the house and in community, or holds onto family members in community. Pt reports independent with ADLs, family checks on her often. Pt reports family provides transportation as she no longer drives.     Hand Dominance        Extremity/Trunk Assessment   Upper Extremity Assessment Upper Extremity Assessment: Defer to OT evaluation    Lower Extremity Assessment Lower Extremity Assessment: Overall WFL for tasks assessed (AROM WNL and strength 4+/5 throughout, symmetrical, denies numbness/tingling)    Cervical / Trunk Assessment Cervical / Trunk Assessment: Normal  Communication   Communication: No difficulties  Cognition Arousal/Alertness: Awake/alert Behavior During Therapy: WFL for tasks assessed/performed Overall Cognitive Status: Within Functional Limits for tasks assessed  General Comments: pt A&O x4 during evaluation      General Comments      Exercises     Assessment/Plan    PT Assessment Patent does not need any further PT services  PT Problem List         PT Treatment Interventions Patient/family education    PT Goals  (Current goals can be found in the Care Plan section)  Acute Rehab PT Goals Patient Stated Goal: return home PT Goal Formulation: With patient Time For Goal Achievement: 07/14/20 Potential to Achieve Goals: Good    Frequency     Barriers to discharge        Co-evaluation               AM-PAC PT "6 Clicks" Mobility  Outcome Measure Help needed turning from your back to your side while in a flat bed without using bedrails?: None Help needed moving from lying on your back to sitting on the side of a flat bed without using bedrails?: None Help needed moving to and from a bed to a chair (including a wheelchair)?: None Help needed standing up from a chair using your arms (e.g., wheelchair or bedside chair)?: None Help needed to walk in hospital room?: None Help needed climbing 3-5 steps with a railing? : A Little 6 Click Score: 23    End of Session   Activity Tolerance: Patient tolerated treatment well Patient left: in bed;with call bell/phone within reach;Other (comment) (OT in room for eval) Nurse Communication: Mobility status PT Visit Diagnosis: Other abnormalities of gait and mobility (R26.89)    Time: 7185-5015 PT Time Calculation (min) (ACUTE ONLY): 19 min   Charges:   PT Evaluation $PT Eval Low Complexity: 1 Low           Tori Lucelia Lacey PT, DPT 06/30/20, 11:12 AM

## 2020-06-30 NOTE — Procedures (Signed)
   HISTORY: 86 year old female, with history of stroke, presented with fall, cognitive impairment,  TECHNIQUE:  This is a routine 16 channel EEG recording with one channel devoted to a limited EKG recording.  It was performed during wakefulness, drowsiness and asleep.  Hyperventilation and photic stimulation were performed as activating procedures.  There are frequent bilateral frontal leads artifact  Upon maximum arousal, posterior dominant waking rhythm consistent of rhythmic alpha range activity, with frequency of 8 hz. Activities are symmetric over the bilateral posterior derivations and attenuated with eye opening.  Hyperventilation produced mild/moderate buildup with higher amplitude and the slower activities noted.  Photic stimulation did not alter the tracing.  During EEG recording, patient developed drowsiness and no deeper stage of sleep was achieved  During EEG recording, there was no epileptiform discharge noted.  EKG demonstrate sinus rhythm  CONCLUSION: This is a  normal awake EEG.  There is no electrodiagnostic evidence of epileptiform discharge.  Marcial Pacas, M.D. Ph.D.  [facility] [place], [place] [address] Phone: [phone] Fax:      [phone]

## 2020-06-30 NOTE — TOC Initial Note (Signed)
Transition of Care [facility]) - Initial/Assessment Note    Patient Details  Name: Jocelyn [name] MRN: [id number] Date of Birth: 1931-12-02  Transition of Care [facility]) CM/SW Contact:    Lynnell Catalan, RN Phone Number: 06/30/2020, 3:12 PM  Clinical Narrative:                  Spoke with pt at bedside about dc plan. Pt is living with her granddaughter and plans to go back there at dc. Pt declines home health services at this time.     Activities of Daily Living Home Assistive Devices/Equipment: Eyeglasses, Hand-held shower hose, Shower chair with back, Walker (specify type), Dentures (specify type) (front wheeled walker, upper denture) ADL Screening (condition at time of admission) Patient's cognitive ability adequate to safely complete daily activities?: Yes Is the patient deaf or have difficulty hearing?: No Does the patient have difficulty seeing, even when wearing glasses/contacts?: No Does the patient have difficulty concentrating, remembering, or making decisions?: No Patient able to express need for assistance with ADLs?: Yes Does the patient have difficulty dressing or bathing?: No Independently performs ADLs?: No Communication: Independent Dressing (OT): Independent Grooming: Independent Feeding: Independent Bathing: Needs assistance Is this a change from baseline?: Pre-admission baseline Toileting: Independent with device (comment) In/Out Bed: Independent Walks in Home: Independent with device (comment) Does the patient have difficulty walking or climbing stairs?: Yes (secondary to weakness) Weakness of Legs: Both Weakness of Arms/Hands: None  Admission diagnosis:  Cellulitis [L03.90] Patient Active Problem List   Diagnosis Date Noted  . Cellulitis 06/29/2020  . Acute post-traumatic wound infection 06/26/2020  . Crushing injury of right lower leg 06/22/2020  . Elbow injury, right, sequela 06/22/2020  . Skin tear of right lower leg without complication 86/76/1950  .  Dysuria 02/23/2020  . OAB (overactive bladder) 02/23/2020  . Cerebrovascular accident (CVA) due to stenosis of right posterior cerebral artery ([place]) 02/08/2020  . Stage 3b chronic kidney disease ([employer]) 01/26/2020  . Current severe episode of major depressive disorder without psychotic features without prior episode ([place]) 01/25/2020  . Vitamin B12 deficiency neuropathy ([place]) 01/25/2020  . Primary osteoarthritis involving multiple joints 12/08/2017  . Thoracic aorta atherosclerosis ([place]) 12/12/2014  . Depression with anxiety 06/09/2013  . Other abnormal glucose 06/09/2013  . Routine general medical examination at a health care facility 06/09/2013  . Dyslipidemia, goal LDL below 130 09/20/2010  . Essential hypertension 12/22/2008   PCP:  Janith Lima, MD Pharmacy:   [facility] [address], [place] [address] [place] [address] Phone: [phone] Fax: [place], [place] [address] [place] [address] Phone: [phone] Fax: [phone]     Social Determinants of Health (SDOH) Interventions    Readmission Risk Interventions No flowsheet data found.

## 2020-06-30 NOTE — Progress Notes (Signed)
PROGRESS NOTE    Jocelyn [name]  AJO:[id number] DOB: 06-29-1932 DOA: 06/29/2020 PCP: Janith Lima, MD   Chief Complain: Leg wound  Brief Narrative: Patient is 86 year old female with history of dementia, hyperlipidemia ,nonhemorrhagic CVA who presented for the evaluation of right lower extremity wound.  As per the report, she fell a few weeks ago and hurt her right leg.  She is currently living with her daughter.  Wound care consulted.  Started on IV antibiotics.   Assessment & Plan:   Active Problems:   Cellulitis  Right lower extremity wound: Does not look grossly infected.  Wound care consulted the patient and put  the recommendation.  We will continue cefazolin for today.  We will follow-up cultures.  Gram stain of the wound did not show any organism.  Currently she is afebrile and she does not have leukocytosis.  We will change antibiotics to oral tomorrow.  Hyperlipidemia: Continue Lipitor  History of nonhemorrhagic CVA: Ambulates with help of walker.  Does not have any residual weakness.  Continue Plavix and Lipitor  Dementia/depression: Currently alert and oriented.  Continue Namenda, Cymbalta  Frequent falls: Seen by PT and OT and not recommended for follow-up          DVT prophylaxis:Lovenox Code Status: DNR Family Communication:  Status is: Observation  The patient remains OBS appropriate and will d/c before 2 midnights.  Dispo: The patient is from: Home              Anticipated d/c is to: Home              Anticipated d/c date is: 1 day              Patient currently is not medically stable to d/c.    Consultants: None  Procedures:None  Antimicrobials:  Anti-infectives (From admission, onward)   Start     Dose/Rate Route Frequency Ordered Stop   06/30/20 1400  ceFAZolin (ANCEF) IVPB 1 g/50 mL premix        1 g 100 mL/hr over 30 Minutes Intravenous Every 8 hours 06/30/20 1038     06/30/20 1000  cefTRIAXone (ROCEPHIN) 1 g in sodium chloride 0.9 %  100 mL IVPB  Status:  Discontinued        1 g 200 mL/hr over 30 Minutes Intravenous Every 24 hours 06/29/20 1722 06/30/20 1035   06/29/20 1145  vancomycin (VANCOREADY) IVPB 1250 mg/250 mL        1,250 mg 166.7 mL/hr over 90 Minutes Intravenous  Once 06/29/20 1114 06/29/20 1357   06/29/20 1045  cefTRIAXone (ROCEPHIN) 1 g in sodium chloride 0.9 % 100 mL IVPB        1 g 200 mL/hr over 30 Minutes Intravenous  Once 06/29/20 1039 06/29/20 1224      Subjective:  Patient seen and examined at the bedside this morning.  Comfortable.  Hemodynamically stable.  Complains of some pain on the right lower extremity .  Was waiting for PT/OT evaluation   Objective: Vitals:   06/29/20 1716 06/29/20 2142 06/30/20 0124 06/30/20 0536  BP: (!) 158/87 140/78 (!) 139/95 (!) 145/82  Pulse: 90 72 68 71  Resp: 17 15 16 16   Temp: 98.7 F (37.1 C) 98.4 F (36.9 C) 98.2 F (36.8 C) 98 F (36.7 C)  TempSrc: Oral Oral Oral Oral  SpO2: 98% 94% 96% 97%  Weight:        Intake/Output Summary (Last 24 hours) at 06/30/2020 1248 Last data filed at  06/30/2020 0505 Gross per 24 hour  Intake 250 ml  Output 500 ml  Net -250 ml   Filed Weights   06/29/20 1630  Weight: 70.2 kg    Examination:  General exam: Pleasant elderly female Respiratory system: Bilateral equal air entry, normal vesicular breath sounds, no wheezes or crackles  Cardiovascular system: S1 & S2 heard, RRR. No JVD, murmurs, rubs, gallops or clicks. No pedal edema. Gastrointestinal system: Abdomen is nondistended, soft and nontender. No organomegaly or masses felt. Normal bowel sounds heard. Central nervous system: Alert and oriented. No focal neurological deficits. Extremities: No edema, no clubbing ,no cyanosis, wound  on the dorsum of the right leg wrapped with dressing skin: no icterus ,no pallor   Data Reviewed: I have personally reviewed following labs and imaging studies  CBC: Recent Labs  Lab 06/29/20 0957 06/30/20 0705  WBC  8.1 6.6  NEUTROABS 5.3  --   HGB 13.8 12.7  HCT 41.4 39.0  MCV 90.2 92.0  PLT 344 295   Basic Metabolic Panel: Recent Labs  Lab 06/29/20 0957 06/30/20 0705  NA 138 136  K 4.7 4.1  CL 105 105  CO2 23 24  GLUCOSE 113* 92  BUN 15 12  CREATININE 1.03* 1.01*  CALCIUM 9.9 9.5   GFR: Estimated Creatinine Clearance: 39.3 mL/min (A) (by C-G formula based on SCr of 1.01 mg/dL (H)). Liver Function Tests: Recent Labs  Lab 06/30/20 0705  AST 12*  ALT 8  ALKPHOS 73  BILITOT 0.6  PROT 5.6*  ALBUMIN 3.4*   No results for input(s): LIPASE, AMYLASE in the last 168 hours. No results for input(s): AMMONIA in the last 168 hours. Coagulation Profile: No results for input(s): INR, PROTIME in the last 168 hours. Cardiac Enzymes: No results for input(s): CKTOTAL, CKMB, CKMBINDEX, TROPONINI in the last 168 hours. BNP (last 3 results) No results for input(s): PROBNP in the last 8760 hours. HbA1C: No results for input(s): HGBA1C in the last 72 hours. CBG: No results for input(s): GLUCAP in the last 168 hours. Lipid Profile: No results for input(s): CHOL, HDL, LDLCALC, TRIG, CHOLHDL, LDLDIRECT in the last 72 hours. Thyroid Function Tests: No results for input(s): TSH, T4TOTAL, FREET4, T3FREE, THYROIDAB in the last 72 hours. Anemia Panel: No results for input(s): VITAMINB12, FOLATE, FERRITIN, TIBC, IRON, RETICCTPCT in the last 72 hours. Sepsis Labs: No results for input(s): PROCALCITON, LATICACIDVEN in the last 168 hours.  Recent Results (from the past 240 hour(s))  WOUND CULTURE     Status: None   Collection Time: 06/26/20 12:31 PM   Specimen: Wound  Result Value Ref Range Status   Source: RLE WOUND INFECTION  Final   Status: FINAL  Final   Gram Stain:   Final    No organisms or white blood cells seen No epithelial cells seen   RESULT: No Growth  Final  Wound or Superficial Culture     Status: None (Preliminary result)   Collection Time: 06/29/20  9:58 AM   Specimen: Wound   Result Value Ref Range Status   Specimen Description   Final    WOUND RT LEG Performed at [facility], [place] [address], [place], [place] [address]    Special Requests   Final    NONE Performed at [facility], [place] [address], [place], [place] [address]    Gram Stain NO WBC SEEN NO ORGANISMS SEEN   Final   Culture   Final    NO GROWTH <  24 HOURS Performed at [facility], [place] [address], [place], [place] [address]    Report Status PENDING  Incomplete  Resp Panel by RT-PCR (Flu A&B, Covid) Nasopharyngeal Swab     Status: None   Collection Time: 06/29/20 10:11 AM   Specimen: Nasopharyngeal Swab; Nasopharyngeal(NP) swabs in vial transport medium  Result Value Ref Range Status   SARS Coronavirus 2 by RT PCR NEGATIVE NEGATIVE Final    Comment: (NOTE) SARS-CoV-2 target nucleic acids are NOT DETECTED.  The SARS-CoV-2 RNA is generally detectable in upper respiratory specimens during the acute phase of infection. The lowest concentration of SARS-CoV-2 viral copies this assay can detect is 138 copies/mL. A negative result does not preclude SARS-Cov-2 infection and should not be used as the sole basis for treatment or other patient management decisions. A negative result may occur with  improper specimen collection/handling, submission of specimen other than nasopharyngeal swab, presence of viral mutation(s) within the areas targeted by this assay, and inadequate number of viral copies(<138 copies/mL). A negative result must be combined with clinical observations, patient history, and epidemiological information. The expected result is Negative.  Fact Sheet for Patients:  EntrepreneurPulse.com.au  Fact Sheet for Healthcare Providers:  IncredibleEmployment.be  This test is no t yet approved or cleared by the [place] FDA and  has been authorized for detection and/or diagnosis of SARS-CoV-2 by FDA  under an Emergency Use Authorization (EUA). This EUA will remain  in effect (meaning this test can be used) for the duration of the COVID-19 declaration under Section 564(b)(1) of the Act, 21 U.S.C.section 360bbb-3(b)(1), unless the authorization is terminated  or revoked sooner.       Influenza A by PCR NEGATIVE NEGATIVE Final   Influenza B by PCR NEGATIVE NEGATIVE Final    Comment: (NOTE) The Xpert Xpress SARS-CoV-2/FLU/RSV plus assay is intended as an aid in the diagnosis of influenza from Nasopharyngeal swab specimens and should not be used as a sole basis for treatment. Nasal washings and aspirates are unacceptable for Xpert Xpress SARS-CoV-2/FLU/RSV testing.  Fact Sheet for Patients: EntrepreneurPulse.com.au  Fact Sheet for Healthcare Providers: IncredibleEmployment.be  This test is not yet approved or cleared by the [place] FDA and has been authorized for detection and/or diagnosis of SARS-CoV-2 by FDA under an Emergency Use Authorization (EUA). This EUA will remain in effect (meaning this test can be used) for the duration of the COVID-19 declaration under Section 564(b)(1) of the Act, 21 U.S.C. section 360bbb-3(b)(1), unless the authorization is terminated or revoked.  Performed at [facility], [place] [address] [place], [place] [address]   Culture, blood (routine x 2)     Status: None (Preliminary result)   Collection Time: 06/29/20 10:20 AM   Specimen: BLOOD  Result Value Ref Range Status   Specimen Description   Final    BLOOD LEFT ANTECUBITAL Performed at [facility] [address] [place], [place] [address]    Special Requests   Final    BOTTLES DRAWN AEROBIC AND ANAEROBIC Blood Culture results may not be optimal due to an inadequate volume of blood received in culture bottles Performed at [place] [address], [place], [place] [address]    Culture    Final    NO GROWTH <12 HOURS Performed at [place] [address], [place], [place] [address]    Report Status PENDING  Incomplete  Culture, blood (routine x 2)     Status: None (  Preliminary result)   Collection Time: 06/29/20 10:30 AM   Specimen: BLOOD  Result Value Ref Range Status   Specimen Description   Final    BLOOD RIGHT WRIST Performed at [facility] [address], [place], [place] [address]    Special Requests   Final    BOTTLES DRAWN AEROBIC AND ANAEROBIC Blood Culture results may not be optimal due to an inadequate volume of blood received in culture bottles Performed at [facility] [address] [place], [place] [address]    Culture   Final    NO GROWTH <12 HOURS Performed at [facility] [address] [place], [place] [address]    Report Status PENDING  Incomplete         Radiology Studies: DG Tibia/Fibula Right  Result Date: 06/29/2020 CLINICAL DATA:  Wound infection. EXAM: RIGHT TIBIA AND FIBULA - 2 VIEW COMPARISON:  06/22/2020 FINDINGS: No fracture or bone lesion. No bone resorption to suggest osteomyelitis. Knee and ankle joints are normally aligned. Soft tissue wound noted anterior to the lower tibial shaft. No soft tissue air. IMPRESSION: 1. No fracture, bone lesion or evidence of osteomyelitis. Electronically Signed   By: Lajean Manes M.D.   On: 06/29/2020 10:59        Scheduled Meds: . atorvastatin  40 mg Oral Daily  . clopidogrel  75 mg Oral Daily  . darifenacin  15 mg Oral Daily  . diphenhydrAMINE  25 mg Oral Once  . DULoxetine  30 mg Oral Daily  . enoxaparin (LOVENOX) injection  40 mg Subcutaneous Q24H  . memantine  5 mg Oral BID   Continuous Infusions: .  ceFAZolin (ANCEF) IV       LOS: 0 days    Time spent: 35 mins More than 50% of that time was spent in counseling and/or coordination of care.      Shelly Coss, MD [facility] P11/19/2021, 12:48 PM

## 2020-06-30 NOTE — Progress Notes (Signed)
Occupational Therapy Evaluation  Patient at baseline with ADLs/functional transfers. Patient reports 3 falls in past 6 months, patient reports having life alert and [place] OT in past perform home safety evaluation. OT provide patient education re: safer foot wear vs loose slippers around the house. No further acute OT needs at this time. Please re-order if new needs arise.    06/30/20 1200  OT Visit Information  Last OT Received On 06/30/20  Assistance Needed +1  History of Present Illness 86 y.o. female with PMH of dementia, HLD, CVA presents to ED with leg wound from a fall a few weeks ago.  Precautions  Precautions Fall  Precaution Comments reports 3 falls in past 6 months  Restrictions  Weight Bearing Restrictions No  Home Living  Family/patient expects to be discharged to: Private residence  Living Arrangements Alone  Available Help at Discharge Family;Available PRN/intermittently  Type of Home House  Home Access Stairs to enter  Entrance Stairs-Number of Steps 3  Entrance Stairs-Rails Left  Home Layout One level  Bathroom Shower/Tub Walk-in shower;Tub/shower unit  Environmental manager - 2 wheels;Walker - 4 wheels;Shower seat;Cane - single point;Grab bars - toilet  Prior Function  Level of Independence Independent with assistive device(s)  Comments Pt reports using rollator or RW for ambulation inside the house and in community, or holds onto family members in community. Pt reports independent with ADLs, family checks on her often. Pt reports family provides transportation as she no longer drives.  Communication  Communication No difficulties  Pain Assessment  Pain Assessment No/denies pain  Cognition  Arousal/Alertness Awake/alert  Behavior During Therapy WFL for tasks assessed/performed  Overall Cognitive Status Within Functional Limits for tasks assessed  Upper Extremity Assessment  Upper Extremity Assessment Overall WFL for tasks  assessed  Lower Extremity Assessment  Lower Extremity Assessment Defer to PT evaluation  Cervical / Trunk Assessment  Cervical / Trunk Assessment Normal  ADL  Overall ADL's  Modified independent;At baseline  General ADL Comments Discussed with patient falls at home, reports she has a life alert but was told calling 911 will get emergency services to your house faster. patient also reports HH OT has been to her house and performed safety evaluation and provided all home modifcations already as OT discussed removing throw rugs/clutter from floors.   Bed Mobility  General bed mobility comments EOB upon arrival  Transfers  Overall transfer level Modified independent  Equipment used Rolling walker (2 wheeled)  Balance  Overall balance assessment Needs assistance;History of Falls  Sitting-balance support Feet supported;No upper extremity supported  Sitting balance-Leahy Scale Good  Standing balance support During functional activity;No upper extremity supported;Bilateral upper extremity supported  Standing balance-Leahy Scale Fair  Standing balance comment static fair without UE support, dynamic poor reliant on UE support  OT - End of Session  Equipment Utilized During Treatment Rolling walker  Activity Tolerance Patient tolerated treatment well  Patient left in chair;with call bell/phone within reach;with chair alarm set  Nurse Communication Mobility status  OT Assessment  OT Recommendation/Assessment Patient does not need any further OT services  OT Visit Diagnosis History of falling (Z91.81)  OT Problem List Decreased activity tolerance  AM-PAC OT "6 Clicks" Daily Activity Outcome Measure (Version 2)  Help from another person eating meals? 4  Help from another person taking care of personal grooming? 4  Help from another person toileting, which includes using toliet, bedpan, or urinal? 4  Help from another person bathing (including washing,  rinsing, drying)? 4  Help from another person  to put on and taking off regular upper body clothing? 4  Help from another person to put on and taking off regular lower body clothing? 4  6 Click Score 24  OT Recommendation  Follow Up Recommendations No OT follow up  OT Equipment None recommended by OT  Acute Rehab OT Goals  Patient Stated Goal return home  OT Goal Formulation All assessment and education complete, DC therapy  OT Time Calculation  OT Start Time (ACUTE ONLY) 0949  OT Stop Time (ACUTE ONLY) 1006  OT Time Calculation (min) 17 min  OT General Charges  $OT Visit 1 Visit  OT Evaluation  $OT Eval Low Complexity 1 Low  Written Expression  Dominant Hand  (did not specify)   Delbert Phenix OT OT pager: [phone]

## 2020-07-01 LAB — AEROBIC CULTURE W GRAM STAIN (SUPERFICIAL SPECIMEN)
Culture: NO GROWTH
Gram Stain: NONE SEEN

## 2020-07-01 MED ORDER — SULFAMETHOXAZOLE-TRIMETHOPRIM 800-160 MG PO TABS: 1.0000 | ORAL_TABLET | Freq: Two times a day (BID) | ORAL | 0 refills | Status: AC

## 2020-07-01 MED ORDER — SULFAMETHOXAZOLE-TRIMETHOPRIM 800-160 MG PO TABS: 1.0000 | ORAL_TABLET | Freq: Two times a day (BID) | ORAL | 0 refills | Status: DC

## 2020-07-01 NOTE — Discharge Instructions (Signed)
Wound care instructions: Xeroform gauze to wound, top with dry gauze and ABD pad. Wrap with gauze from knee to toe and top with ACE bandage for mild compression.

## 2020-07-01 NOTE — Progress Notes (Signed)
Patient discharged to home with family, discharge instructions reviewed with patient and daughter Lynelle Smoke ( via phone), both verbalized understanding.

## 2020-07-01 NOTE — Discharge Summary (Signed)
Physician Discharge Summary  Jocelyn [name] UDJ:[id number] DOB: [birth year] DOA: 06/29/2020  PCP: Janith Lima, MD  Admit date: 06/29/2020 Discharge date: 07/01/2020  Admitted From: Home Disposition:  Home  Discharge Condition:Stable CODE STATUS:DNR Diet recommendation: Heart Healthy  Brief/Interim Summary: Patient is 86 year old female with history of dementia, hyperlipidemia ,nonhemorrhagic CVA who presented for the evaluation of right lower extremity wound.  As per the report, she fell a few weeks ago and hurt her right leg.  She is currently living with her daughter.  Wound care consulted.  Started on IV antibiotics.  Wound does not look infected.  She was seen by PT/OT with no follow-up recommendation.  She is stable for discharge home today with oral antibiotics.  Following problems were addressed during hospitalization:  Right lower extremity wound: Does not look grossly infected.  Wound care consulted the patient and put  the recommendation.    Antibiotics changed to oral.  Blood cultures have been negative..  Gram stain of the wound did not show any organism.  Currently she is afebrile and she does not have leukocytosis.  Hyperlipidemia: Continue Lipitor  History of nonhemorrhagic CVA: Ambulates with help of walker.  Does not have any residual weakness.  Continue Plavix and Lipitor  Dementia/depression: Currently alert and oriented.  Continue Namenda, Cymbalta  Frequent falls: Seen by PT and OT and not recommended for follow-up.  Home health RN arranged.    Discharge Diagnoses:  Active Problems:   Cellulitis    Discharge Instructions  Discharge Instructions    Diet - low sodium heart healthy   Complete by: As directed    Discharge wound care:   Complete by: As directed    As per wound nurse   Increase activity slowly   Complete by: As directed      Allergies as of 07/01/2020      Reactions   Codeine Itching, Nausea And Vomiting   Tape Rash   Tears the  skin      Medication List    STOP taking these medications   oxyCODONE 5 MG immediate release tablet Commonly known as: Oxy IR/ROXICODONE     TAKE these medications   atorvastatin 40 MG tablet Commonly known as: LIPITOR Take 1 tablet (40 mg total) by mouth daily.   B-12 COMPLIANCE INJECTION IJ Inject 1 Dose as directed every 30 (thirty) days.   bacitracin ointment Apply 1 application topically 2 (two) times daily.   clopidogrel 75 MG tablet Commonly known as: PLAVIX Take 1 tablet (75 mg total) by mouth daily.   DULoxetine 30 MG capsule Commonly known as: CYMBALTA TAKE ONE CAPSULE BY MOUTH ONE TIME DAILY   memantine tablet pack Commonly known as: Namenda Titration Pak 5 mg/day for =1 week; 5 mg twice daily for =1 week; 15 mg/day given in 5 mg and 10 mg separated doses for =1 week; then 10 mg twice daily What changed:   how much to take  how to take this  when to take this   Nuzyra 150 MG Tabs Generic drug: Omadacycline Tosylate Take 2 tablets by mouth daily for 10 days.   promethazine 12.5 MG tablet Commonly known as: PHENERGAN Take 1 tablet (12.5 mg total) by mouth every 6 (six) hours as needed for nausea or vomiting.   solifenacin 10 MG tablet Commonly known as: VESICARE Take 1 tablet (10 mg total) by mouth daily.   sulfamethoxazole-trimethoprim 800-160 MG tablet Commonly known as: BACTRIM DS Take 1 tablet by mouth 2 (two) times daily  for 5 days.            Discharge Care Instructions  (From admission, onward)         Start     Ordered   07/01/20 0000  Discharge wound care:       Comments: As per wound nurse   07/01/20 1115          Follow-up Information    Janith Lima, MD. Schedule an appointment as soon as possible for a visit in 1 week(s).   Specialty: Internal Medicine Contact information: [place] [address] [phone]              Allergies  Allergen Reactions  . Codeine Itching and Nausea And  Vomiting  . Tape Rash    Tears the skin    Consultations:  None   Procedures/Studies: EEG  Result Date: 06/26/2020 Marcial Pacas, MD     06/30/2020  1:48 PM HISTORY: 86 year old female, with history of stroke, presented with fall, cognitive impairment, TECHNIQUE: This is a routine 16 channel EEG recording with one channel devoted to a limited EKG recording.  It was performed during wakefulness, drowsiness and asleep.  Hyperventilation and photic stimulation were performed as activating procedures.  There are frequent bilateral frontal leads artifact Upon maximum arousal, posterior dominant waking rhythm consistent of rhythmic alpha range activity, with frequency of 8 hz. Activities are symmetric over the bilateral posterior derivations and attenuated with eye opening. Hyperventilation produced mild/moderate buildup with higher amplitude and the slower activities noted. Photic stimulation did not alter the tracing. During EEG recording, patient developed drowsiness and no deeper stage of sleep was achieved During EEG recording, there was no epileptiform discharge noted. EKG demonstrate sinus rhythm CONCLUSION: This is a  normal awake EEG.  There is no electrodiagnostic evidence of epileptiform discharge. Marcial Pacas, M.D. Ph.D. [facility] [address], [place] [address] Phone: [phone] Fax:      [phone]  DG Elbow Complete Right  Result Date: 06/22/2020 CLINICAL DATA:  Trauma EXAM: RIGHT ELBOW - COMPLETE 3+ VIEW COMPARISON:  None. FINDINGS: There is no evidence of fracture, dislocation, or joint effusion. There is no evidence of arthropathy or other focal bone abnormality. Soft tissues are unremarkable. IMPRESSION: Negative. Electronically Signed   By: Donavan Foil M.D.   On: 06/22/2020 16:07   DG Tibia/Fibula Right  Result Date: 06/29/2020 CLINICAL DATA:  Wound infection. EXAM: RIGHT TIBIA AND FIBULA - 2 VIEW COMPARISON:  06/22/2020 FINDINGS: No fracture or bone  lesion. No bone resorption to suggest osteomyelitis. Knee and ankle joints are normally aligned. Soft tissue wound noted anterior to the lower tibial shaft. No soft tissue air. IMPRESSION: 1. No fracture, bone lesion or evidence of osteomyelitis. Electronically Signed   By: Lajean Manes M.D.   On: 06/29/2020 10:59   DG Tibia/Fibula Right  Result Date: 06/22/2020 CLINICAL DATA:  Right lower extremity trauma laceration EXAM: RIGHT TIBIA AND FIBULA - 2 VIEW COMPARISON:  None. FINDINGS: There is no evidence of fracture or other focal bone lesions. Soft tissue defect anteriorly at the distal lower leg presumably corresponding to history of laceration. No radiopaque foreign body. IMPRESSION: No acute osseous abnormality. Electronically Signed   By: Donavan Foil M.D.   On: 06/22/2020 16:06   MR BRAIN WO CONTRAST  Result Date: 06/19/2020 CLINICAL DATA:  Dizziness, nonspecific.  Prominent dental implants. EXAM: MRI HEAD WITHOUT CONTRAST TECHNIQUE: Multiplanar, multiecho pulse sequences of the brain and surrounding structures were  obtained without intravenous contrast. COMPARISON:  CTA head and neck 02/01/2020. MR head without and with contrast 01/30/2021 FINDINGS: Brain: Persistent diffusion signal abnormality is present in the medial right occipital lobe. This may represent susceptibility artifact. Other areas of diffusion abnormality on the prior exam no longer present. Extensive confluent periventricular and subcortical white matter changes are present. Remote lacunar infarcts involving the left lentiform nucleus and internal capsule as well as the right thalamus are again noted. Remote lacunar infarct of the right cerebral peduncle is noted. Remote lacunar infarcts are present within the cerebellum bilaterally. No acute infarct is present. No acute or focal cortical abnormality is present. Posterior fossa somewhat obscured by dental heart artifact. The ventricles are proportionate to the degree of atrophy. No  significant extraaxial fluid collection is present. White matter changes extend into the brainstem Vascular: Flow is present in the major intracranial arteries. Skull and upper cervical spine: The craniocervical junction is normal. Upper cervical spine is within normal limits. Marrow signal is unremarkable. Sinuses/Orbits: Sinuses are obscured by artifact. Bilateral lens replacements are noted. Globes and orbits are otherwise unremarkable. IMPRESSION: 1. No acute intracranial abnormality or significant interval change. 2. Persistent diffusion signal abnormality in the medial right occipital lobe. This may represent susceptibility artifact. Evolution of the medial right occipital lobe infarct is otherwise as expected. 3. Remote lacunar infarcts of the left lentiform nucleus and internal capsule as well as the right thalamus are again noted. 4. Extensive confluent periventricular and subcortical white matter disease bilaterally is moderately advanced for age. This likely reflects the sequela of chronic microvascular ischemia. Electronically Signed   By: San Morelle M.D.   On: 06/19/2020 17:37   MR BRAIN W WO CONTRAST  Result Date: 06/24/2020  [facility] [address], [place], [place] [address] [phone] NEUROIMAGING REPORT STUDY DATE: 06/23/2020 PATIENT NAME: Marchell Froman DOB: 1931-11-15 MRN: [id number] EXAM: MRI Brain with and without contrast ORDERING CLINICIAN: Frann Rider, NP CLINICAL HISTORY: 86 year old woman with cognitive decline COMPARISON FILMS: MRI of the head (without contrast) 06/19/2020 and MRI of the brain with and without contrast 01/31/2020 TECHNIQUE:MRI of the brain with and without contrast was obtained utilizing 5 mm axial slices with T1, T2, T2 flair, SWI and diffusion weighted views.  T1 sagittal, T2 coronal and postcontrast views in the axial and coronal plane were obtained. CONTRAST: 14 ml [employer] IMAGING SITE: [facility], [place]. FINDINGS: On sagittal images, the spinal cord is imaged caudally to C4 and is normal in caliber.   The contents of the posterior fossa are of normal size and position.   The pituitary gland and optic chiasm appear normal.    There is mild generalized cortical atrophy, typical for age.  There are no abnormal extra-axial collections of fluid.  There is encephalomalacia in the medial right occipital lobe corresponding to the acute stroke in June 2021.  Some linear enhancements are noted within the region of encephalomalacia.  As seen on the MRI from several days ago, there continues to be increased signal on diffusion-weighted images though not as intense as June.  The mass-effect noted on the 01/31/2020 MRI has resolved.  Chronic lacunar infarctions are noted in the left basal ganglia and the right thalamus.  Additional T2/flair hyperintense foci are noted throughout the hemispheres with confluencies in the deep white matter..  Susceptibility weighted images are normal.   She has had bilateral cataract replacements.  Otherwise, the orbits appear normal.   The VIIth/VIIIth nerve  complex appears normal.  The mastoid air cells appear normal.  There are mucous retention cysts in the maxillary sinus.  The other paranasal sinuses appear normal.  Flow voids are identified within the major intracerebral arteries.    This MRI of the brain with and without contrast shows the following: 1.  Evolving stroke involving the medial right occipital lobe.  Although enhancement usually resolves within 2 months of a stroke, it may persist longer.  Mass-effect that was noted in June 2021 has resolved. 2.  Stable appearance of lacunar infarctions in the left basal ganglia and right thalamus and advanced chronic microvascular ischemic changes. INTERPRETING PHYSICIAN: Richard A. Felecia Shelling, MD, PhD, FAAN Certified in  Neuroimaging by [employer]       Subjective: Patient seen and examined at the bedside this  morning.  Hemodynamically stable for discharge today  Discharge Exam: Vitals:   06/30/20 2022 07/01/20 0606  BP: (!) 163/85 (!) 159/80  Pulse: 88 68  Resp: 14 14  Temp: 98.4 F (36.9 C) (!) 97.5 F (36.4 C)  SpO2: 98% 100%   Vitals:   06/30/20 1436 06/30/20 1727 06/30/20 2022 07/01/20 0606  BP: (!) 152/94 (!) 152/94 (!) 163/85 (!) 159/80  Pulse: 86 86 88 68  Resp: 17 17 14 14   Temp: 98 F (36.7 C) 98 F (36.7 C) 98.4 F (36.9 C) (!) 97.5 F (36.4 C)  TempSrc: Oral Oral Oral Oral  SpO2: 95%  98% 100%  Weight:  70.2 kg    Height:  5\' 5"  (1.651 m)      General: Pt is alert, awake, not in acute distress Cardiovascular: RRR, S1/S2 +, no rubs, no gallops Respiratory: CTA bilaterally, no wheezing, no rhonchi Abdominal: Soft, NT, ND, bowel sounds + Extremities: no edema, no cyanosis, wound on the dorsum of the right leg    The results of significant diagnostics from this hospitalization (including imaging, microbiology, ancillary and laboratory) are listed below for reference.     Microbiology: Recent Results (from the past 240 hour(s))  WOUND CULTURE     Status: None   Collection Time: 06/26/20 12:31 PM   Specimen: Wound  Result Value Ref Range Status   Source: RLE WOUND INFECTION  Final   Status: FINAL  Final   Gram Stain:   Final    No organisms or white blood cells seen No epithelial cells seen   RESULT: No Growth  Final  Wound or Superficial Culture     Status: None (Preliminary result)   Collection Time: 06/29/20  9:58 AM   Specimen: Wound  Result Value Ref Range Status   Specimen Description   Final    WOUND RT LEG Performed at [facility], [place] [address] [place], [place] [address]    Special Requests   Final    NONE Performed at [facility], [place] [address] [place], [place] [address]    Gram Stain NO WBC SEEN NO ORGANISMS SEEN   Final   Culture   Final    NO GROWTH < 24 HOURS Performed at [place], [place] [address], [place], [place] [address]    Report Status PENDING  Incomplete  Resp Panel by RT-PCR (Flu A&B, Covid) Nasopharyngeal Swab     Status: None   Collection Time: 06/29/20 10:11 AM   Specimen: Nasopharyngeal Swab; Nasopharyngeal(NP) swabs in vial transport medium  Result Value Ref Range Status   SARS Coronavirus 2 by RT PCR NEGATIVE NEGATIVE  Final    Comment: (NOTE) SARS-CoV-2 target nucleic acids are NOT DETECTED.  The SARS-CoV-2 RNA is generally detectable in upper respiratory specimens during the acute phase of infection. The lowest concentration of SARS-CoV-2 viral copies this assay can detect is 138 copies/mL. A negative result does not preclude SARS-Cov-2 infection and should not be used as the sole basis for treatment or other patient management decisions. A negative result may occur with  improper specimen collection/handling, submission of specimen other than nasopharyngeal swab, presence of viral mutation(s) within the areas targeted by this assay, and inadequate number of viral copies(<138 copies/mL). A negative result must be combined with clinical observations, patient history, and epidemiological information. The expected result is Negative.  Fact Sheet for Patients:  EntrepreneurPulse.com.au  Fact Sheet for Healthcare Providers:  IncredibleEmployment.be  This test is no t yet approved or cleared by the [place] FDA and  has been authorized for detection and/or diagnosis of SARS-CoV-2 by FDA under an Emergency Use Authorization (EUA). This EUA will remain  in effect (meaning this test can be used) for the duration of the COVID-19 declaration under Section 564(b)(1) of the Act, 21 U.S.C.section 360bbb-3(b)(1), unless the authorization is terminated  or revoked sooner.       Influenza A by PCR NEGATIVE NEGATIVE Final   Influenza B by PCR NEGATIVE NEGATIVE Final    Comment: (NOTE) The Xpert Xpress  SARS-CoV-2/FLU/RSV plus assay is intended as an aid in the diagnosis of influenza from Nasopharyngeal swab specimens and should not be used as a sole basis for treatment. Nasal washings and aspirates are unacceptable for Xpert Xpress SARS-CoV-2/FLU/RSV testing.  Fact Sheet for Patients: EntrepreneurPulse.com.au  Fact Sheet for Healthcare Providers: IncredibleEmployment.be  This test is not yet approved or cleared by the [place] FDA and has been authorized for detection and/or diagnosis of SARS-CoV-2 by FDA under an Emergency Use Authorization (EUA). This EUA will remain in effect (meaning this test can be used) for the duration of the COVID-19 declaration under Section 564(b)(1) of the Act, 21 U.S.C. section 360bbb-3(b)(1), unless the authorization is terminated or revoked.  Performed at [facility], [place] [address] [place], [place] [address]   Culture, blood (routine x 2)     Status: None (Preliminary result)   Collection Time: 06/29/20 10:20 AM   Specimen: BLOOD  Result Value Ref Range Status   Specimen Description   Final    BLOOD LEFT ANTECUBITAL Performed at [place] [address] [place], [place] [address]    Special Requests   Final    BOTTLES DRAWN AEROBIC AND ANAEROBIC Blood Culture results may not be optimal due to an inadequate volume of blood received in culture bottles Performed at [place] [address], [place], [place] [address]    Culture   Final    NO GROWTH 2 DAYS Performed at [place] [address], [place], [place] [address]    Report Status PENDING  Incomplete  Culture, blood (routine x 2)     Status: None (Preliminary result)   Collection Time: 06/29/20 10:30 AM   Specimen: BLOOD  Result Value Ref Range Status   Specimen Description   Final    BLOOD RIGHT WRIST Performed at [place] [address] [place], [place] [address]    Special Requests   Final    BOTTLES DRAWN AEROBIC AND ANAEROBIC Blood Culture results may not be optimal due to an inadequate volume  of blood received in culture bottles Performed at [facility], [place] [address], [place], [place] [address]    Culture   Final    NO GROWTH 2 DAYS Performed at [place] [address] [place], [place] [address]    Report Status PENDING  Incomplete     Labs: BNP (last 3 results) No results for input(s): BNP in the last 8760 hours. Basic Metabolic Panel: Recent Labs  Lab 06/29/20 0957 06/30/20 0705  NA 138 136  K 4.7 4.1  CL 105 105  CO2 23 24  GLUCOSE 113* 92  BUN 15 12  CREATININE 1.03* 1.01*  CALCIUM 9.9 9.5   Liver Function Tests: Recent Labs  Lab 06/30/20 0705  AST 12*  ALT 8  ALKPHOS 73  BILITOT 0.6  PROT 5.6*  ALBUMIN 3.4*   No results for input(s): LIPASE, AMYLASE in the last 168 hours. No results for input(s): AMMONIA in the last 168 hours. CBC: Recent Labs  Lab 06/29/20 0957 06/30/20 0705  WBC 8.1 6.6  NEUTROABS 5.3  --   HGB 13.8 12.7  HCT 41.4 39.0  MCV 90.2 92.0  PLT 344 299   Cardiac Enzymes: No results for input(s): CKTOTAL, CKMB, CKMBINDEX, TROPONINI in the last 168 hours. BNP: Invalid input(s): POCBNP CBG: No results for input(s): GLUCAP in the last 168 hours. D-Dimer No results for input(s): DDIMER in the last 72 hours. Hgb A1c No results for input(s): HGBA1C in the last 72 hours. Lipid Profile No results for input(s): CHOL, HDL, LDLCALC, TRIG, CHOLHDL, LDLDIRECT in the last 72 hours. Thyroid function studies No results for input(s): TSH, T4TOTAL, T3FREE, THYROIDAB in the last 72 hours.  Invalid input(s): FREET3 Anemia work up No results for input(s): VITAMINB12, FOLATE, FERRITIN, TIBC, IRON, RETICCTPCT in the last 72 hours. Urinalysis    Component Value Date/Time   COLORURINE AMBER (A) 01/31/2020 2208   APPEARANCEUR CLEAR 01/31/2020  2208   LABSPEC 1.028 01/31/2020 2208   PHURINE 5.0 01/31/2020 2208   GLUCOSEU NEGATIVE 01/31/2020 2208   GLUCOSEU NEGATIVE 04/08/2011 1614   HGBUR NEGATIVE 01/31/2020 2208   BILIRUBINUR Positive 02/23/2020 1357   KETONESUR NEGATIVE 01/31/2020 2208   PROTEINUR Positive (A) 02/23/2020 1357   PROTEINUR NEGATIVE 01/31/2020 2208   UROBILINOGEN 0.2 02/23/2020 1357   UROBILINOGEN 0.2 04/08/2011 1614   NITRITE Negative 02/23/2020 1357   NITRITE POSITIVE (A) 01/31/2020 2208   LEUKOCYTESUR Negative 02/23/2020 1357   LEUKOCYTESUR NEGATIVE 01/31/2020 2208   Sepsis Labs Invalid input(s): PROCALCITONIN,  WBC,  LACTICIDVEN Microbiology Recent Results (from the past 240 hour(s))  WOUND CULTURE     Status: None   Collection Time: 06/26/20 12:31 PM   Specimen: Wound  Result Value Ref Range Status   Source: RLE WOUND INFECTION  Final   Status: FINAL  Final   Gram Stain:   Final    No organisms or white blood cells seen No epithelial cells seen   RESULT: No Growth  Final  Wound or Superficial Culture     Status: None (Preliminary result)   Collection Time: 06/29/20  9:58 AM   Specimen: Wound  Result Value Ref Range Status   Specimen Description   Final    WOUND RT LEG Performed at [facility], [place] [address] [place], [place] [address]    Special Requests   Final    NONE Performed at [facility], [place] [address] [place], [place] [address]    Gram Stain  NO WBC SEEN NO ORGANISMS SEEN   Final   Culture   Final    NO GROWTH < 24 HOURS Performed at [facility], [place] [address], [place], [place] [address]    Report Status PENDING  Incomplete  Resp Panel by RT-PCR (Flu A&B, Covid) Nasopharyngeal Swab     Status: None   Collection Time: 06/29/20 10:11 AM   Specimen: Nasopharyngeal Swab; Nasopharyngeal(NP) swabs in vial transport medium  Result Value Ref Range Status   SARS Coronavirus 2 by RT PCR NEGATIVE NEGATIVE Final    Comment:  (NOTE) SARS-CoV-2 target nucleic acids are NOT DETECTED.  The SARS-CoV-2 RNA is generally detectable in upper respiratory specimens during the acute phase of infection. The lowest concentration of SARS-CoV-2 viral copies this assay can detect is 138 copies/mL. A negative result does not preclude SARS-Cov-2 infection and should not be used as the sole basis for treatment or other patient management decisions. A negative result may occur with  improper specimen collection/handling, submission of specimen other than nasopharyngeal swab, presence of viral mutation(s) within the areas targeted by this assay, and inadequate number of viral copies(<138 copies/mL). A negative result must be combined with clinical observations, patient history, and epidemiological information. The expected result is Negative.  Fact Sheet for Patients:  EntrepreneurPulse.com.au  Fact Sheet for Healthcare Providers:  IncredibleEmployment.be  This test is no t yet approved or cleared by the [place] FDA and  has been authorized for detection and/or diagnosis of SARS-CoV-2 by FDA under an Emergency Use Authorization (EUA). This EUA will remain  in effect (meaning this test can be used) for the duration of the COVID-19 declaration under Section 564(b)(1) of the Act, 21 U.S.C.section 360bbb-3(b)(1), unless the authorization is terminated  or revoked sooner.       Influenza A by PCR NEGATIVE NEGATIVE Final   Influenza B by PCR NEGATIVE NEGATIVE Final    Comment: (NOTE) The Xpert Xpress SARS-CoV-2/FLU/RSV plus assay is intended as an aid in the diagnosis of influenza from Nasopharyngeal swab specimens and should not be used as a sole basis for treatment. Nasal washings and aspirates are unacceptable for Xpert Xpress SARS-CoV-2/FLU/RSV testing.  Fact Sheet for Patients: EntrepreneurPulse.com.au  Fact Sheet for Healthcare  Providers: IncredibleEmployment.be  This test is not yet approved or cleared by the [place] FDA and has been authorized for detection and/or diagnosis of SARS-CoV-2 by FDA under an Emergency Use Authorization (EUA). This EUA will remain in effect (meaning this test can be used) for the duration of the COVID-19 declaration under Section 564(b)(1) of the Act, 21 U.S.C. section 360bbb-3(b)(1), unless the authorization is terminated or revoked.  Performed at [facility], [place] [address], [place], [place] [address]   Culture, blood (routine x 2)     Status: None (Preliminary result)   Collection Time: 06/29/20 10:20 AM   Specimen: BLOOD  Result Value Ref Range Status   Specimen Description   Final    BLOOD LEFT ANTECUBITAL Performed at [place] [address] [place], [place] [address]    Special Requests   Final    BOTTLES DRAWN AEROBIC AND ANAEROBIC Blood Culture results may not be optimal due to an inadequate volume of blood received in culture bottles Performed at [place] [address], [place], [place] [address]    Culture   Final    NO GROWTH 2 DAYS Performed at [place] [address] [place], [place]  [address]    Report Status PENDING  Incomplete  Culture, blood (routine x 2)     Status: None (Preliminary result)   Collection Time: 06/29/20 10:30 AM   Specimen: BLOOD  Result Value Ref Range Status   Specimen Description   Final    BLOOD RIGHT WRIST Performed at [facility] [address], [place], [place] [address]    Special Requests   Final    BOTTLES DRAWN AEROBIC AND ANAEROBIC Blood Culture results may not be optimal due to an inadequate volume of blood received in culture bottles Performed at [facility] [address], [place], [place] [address]    Culture   Final    NO GROWTH 2 DAYS Performed at [facility] [address], [place],  [address]    Report Status PENDING  Incomplete    Please note: You were cared for by a hospitalist during your hospital stay. Once you are discharged, your primary care physician will handle any further medical issues. Please note that NO REFILLS for any discharge medications will be authorized once you are discharged, as it is imperative that you return to your primary care physician (or establish a relationship with a primary care physician if you do not have one) for your post hospital discharge needs so that they can reassess your need for medications and monitor your lab values.    Time coordinating discharge: 40 minutes  SIGNED:   Shelly Coss, MD  [facility] 07/01/2020, 11:15 AM Pager [phone]  If 7PM-7AM, please contact night-coverage [email or web address] Password [email or web address]

## 2020-07-01 NOTE — TOC Progression Note (Signed)
Transition of Care [facility]) - Progression Note    Patient Details  Name: Jocelyn [name] MRN: [id number] Date of Birth: 1931-08-24  Transition of Care [facility]) CM/SW Contact  Joaquin Courts, RN Phone Number: 07/01/2020, 2:59 PM  Clinical Narrative:    CM spoke with patient. [facility] to provide [facility] services.  Patient reports she will be staying with her daughter, address confirmed: [address] [place], [place] [address]    Expected Discharge Plan: [place] Barriers to Discharge: No Barriers Identified  Expected Discharge Plan and Services Expected Discharge Plan: [place]   Discharge Planning Services: CM Consult Post Acute Care Choice: [place] arrangements for the past 2 months: Single Family Home Expected Discharge Date: 07/01/20               DME Arranged: N/A DME Agency: NA       HH Arranged: RN [place] Agency: [place] ([place]) Date [place]: 07/01/20 Time [place]: [place] Representative spoke with at [place]: [place] ([place]) Interventions    Readmission Risk Interventions No flowsheet data found.

## 2020-07-02 DIAGNOSIS — S81801D Unspecified open wound, right lower leg, subsequent encounter: Secondary | ICD-10-CM | POA: Diagnosis not present

## 2020-07-02 DIAGNOSIS — L03115 Cellulitis of right lower limb: Secondary | ICD-10-CM | POA: Diagnosis not present

## 2020-07-02 DIAGNOSIS — Z7902 Long term (current) use of antithrombotics/antiplatelets: Secondary | ICD-10-CM | POA: Diagnosis not present

## 2020-07-02 DIAGNOSIS — S8781XA Crushing injury of right lower leg, initial encounter: Secondary | ICD-10-CM | POA: Diagnosis not present

## 2020-07-02 DIAGNOSIS — I10 Essential (primary) hypertension: Secondary | ICD-10-CM | POA: Diagnosis not present

## 2020-07-02 DIAGNOSIS — Z9181 History of falling: Secondary | ICD-10-CM | POA: Diagnosis not present

## 2020-07-02 DIAGNOSIS — F0390 Unspecified dementia without behavioral disturbance: Secondary | ICD-10-CM | POA: Diagnosis not present

## 2020-07-02 DIAGNOSIS — E785 Hyperlipidemia, unspecified: Secondary | ICD-10-CM | POA: Diagnosis not present

## 2020-07-02 DIAGNOSIS — R32 Unspecified urinary incontinence: Secondary | ICD-10-CM | POA: Diagnosis not present

## 2020-07-02 DIAGNOSIS — Z8673 Personal history of transient ischemic attack (TIA), and cerebral infarction without residual deficits: Secondary | ICD-10-CM | POA: Diagnosis not present

## 2020-07-02 DIAGNOSIS — F32A Depression, unspecified: Secondary | ICD-10-CM | POA: Diagnosis not present

## 2020-07-02 DIAGNOSIS — W19XXXD Unspecified fall, subsequent encounter: Secondary | ICD-10-CM | POA: Diagnosis not present

## 2020-07-03 ENCOUNTER — Telehealth: Payer: Self-pay

## 2020-07-03 NOTE — Telephone Encounter (Signed)
Transition Care Management Unsuccessful Follow-up Telephone Call  Date of discharge and from where:  07/01/2020 from [facility]   Attempts:  1st Attempt  Reason for unsuccessful TCM follow-up call:  Left voice message

## 2020-07-04 ENCOUNTER — Telehealth: Payer: Self-pay

## 2020-07-04 ENCOUNTER — Telehealth: Payer: Self-pay | Admitting: Internal Medicine

## 2020-07-04 LAB — CULTURE, BLOOD (ROUTINE X 2)
Culture: NO GROWTH
Culture: NO GROWTH

## 2020-07-04 NOTE — Telephone Encounter (Signed)
Marcie Bal from [facility] called  She has been visiting the patient after being discharged from the hospital for her wound. The wound is currently looking very dry. Would it be okay to do the hydrogel daily? The nurses would come 2x daily and 3x a week to help dress the wound.   Please give her a call back to advise at [phone]

## 2020-07-04 NOTE — Telephone Encounter (Signed)
-----   Message from Frann Rider, NP sent at 07/03/2020 12:25 PM EST ----- Please advise patient that recent EEG negative for underlying abnormality or seizure activity

## 2020-07-04 NOTE — Telephone Encounter (Cosign Needed)
Transition Care Management Unsuccessful Follow-up Telephone Call   Date of discharge and from where:  07/01/2020 from [facility]  Attempts:  2nd Attempt  Reason for unsuccessful TCM follow-up call:  Unable to leave message; did leave detailed message for emergency contact per DPR.

## 2020-07-04 NOTE — Telephone Encounter (Cosign Needed)
Transition Care Management Follow-up Telephone Call  Date of discharge and from where: 07/01/2020 from [facility]  How have you been since you were released from the hospital? Feeling pretty good; still dealing with the wound.  Any questions or concerns? No  Items Reviewed:  Did the pt receive and understand the discharge instructions provided? Yes   Medications obtained and verified? Yes   Other? No   Any new allergies since your discharge? No   Dietary orders reviewed? Yes (low sodium heart healthy diet)  Do you have support at home? Yes  (daughter)  [place] and Equipment/Supplies: Were home health services ordered? yes If so, what is the name of the agency? [facility]  Has the agency set up a time to come to the patient's home? yes Were any new equipment or medical supplies ordered?  Yes: it was ordered What is the name of the medical supply agency? [facility] Were you able to get the supplies/equipment? No (daughter has been changing wounds twice a day) Do you have any questions related to the use of the equipment or supplies? No  Functional Questionnaire: (I = Independent and D = Dependent) ADLs: yes  Bathing/Dressing- d; using shower chair  Meal Prep- d  Eating- i  Maintaining continence- d; wears a depends for protection  Transferring/Ambulation- d; walker  Managing Meds- d; daughter is handling it  Follow up appointments reviewed:   PCP Hospital f/u appt confirmed? Yes  Scheduled to see Scarlette Calico, MD on 07/10/2020 @ 1:40 pm.  [facility] f/u appt confirmed? Yes  Scheduled to see Mingo Amber, MD on 07/13/2020 @ 11:45 am.  Are transportation arrangements needed? No   If their condition worsens, is the pt aware to call PCP or go to the Emergency Dept.? Yes  Was the patient provided with contact information for the PCP's office or ED? Yes  Was to pt encouraged to call back with questions or concerns? Yes

## 2020-07-04 NOTE — Telephone Encounter (Signed)
Pt verified by name and DOB,  normal results given per provider, pt voiced understanding all question answered. °

## 2020-07-04 NOTE — Telephone Encounter (Signed)
Attempted to call pt, LVM for Call back. 

## 2020-07-05 DIAGNOSIS — I10 Essential (primary) hypertension: Secondary | ICD-10-CM | POA: Diagnosis not present

## 2020-07-05 DIAGNOSIS — E785 Hyperlipidemia, unspecified: Secondary | ICD-10-CM | POA: Diagnosis not present

## 2020-07-05 DIAGNOSIS — F0390 Unspecified dementia without behavioral disturbance: Secondary | ICD-10-CM | POA: Diagnosis not present

## 2020-07-05 DIAGNOSIS — S81801D Unspecified open wound, right lower leg, subsequent encounter: Secondary | ICD-10-CM | POA: Diagnosis not present

## 2020-07-05 DIAGNOSIS — L03115 Cellulitis of right lower limb: Secondary | ICD-10-CM | POA: Diagnosis not present

## 2020-07-05 DIAGNOSIS — F32A Depression, unspecified: Secondary | ICD-10-CM | POA: Diagnosis not present

## 2020-07-05 NOTE — Telephone Encounter (Signed)
Verbal order given  

## 2020-07-10 ENCOUNTER — Ambulatory Visit (INDEPENDENT_AMBULATORY_CARE_PROVIDER_SITE_OTHER): Payer: Medicare Other | Admitting: Internal Medicine

## 2020-07-10 ENCOUNTER — Other Ambulatory Visit: Payer: Self-pay

## 2020-07-10 ENCOUNTER — Telehealth: Payer: Self-pay

## 2020-07-10 ENCOUNTER — Encounter: Payer: Self-pay | Admitting: Internal Medicine

## 2020-07-10 VITALS — BP 136/84 | HR 81 | Temp 98.6°F | Ht 65.0 in | Wt 148.0 lb

## 2020-07-10 DIAGNOSIS — G63 Polyneuropathy in diseases classified elsewhere: Secondary | ICD-10-CM

## 2020-07-10 DIAGNOSIS — L03818 Cellulitis of other sites: Secondary | ICD-10-CM

## 2020-07-10 DIAGNOSIS — E538 Deficiency of other specified B group vitamins: Secondary | ICD-10-CM

## 2020-07-10 DIAGNOSIS — T148XXA Other injury of unspecified body region, initial encounter: Secondary | ICD-10-CM

## 2020-07-10 DIAGNOSIS — I10 Essential (primary) hypertension: Secondary | ICD-10-CM

## 2020-07-10 DIAGNOSIS — L089 Local infection of the skin and subcutaneous tissue, unspecified: Secondary | ICD-10-CM

## 2020-07-10 MED ORDER — CYANOCOBALAMIN 1000 MCG/ML IJ SOLN
1000.0000 ug | Freq: Once | INTRAMUSCULAR | Status: AC
  Administered 2020-07-10: 1000 ug via INTRAMUSCULAR

## 2020-07-10 NOTE — Progress Notes (Signed)
Subjective:  Patient ID: Jocelyn [name], female    DOB: [birth year]  Age: 86 y.o. MRN: [id number]  CC: Follow-up  This visit occurred during the SARS-CoV-2 public health emergency.  Safety protocols were in place, including screening questions prior to the visit, additional usage of staff PPE, and extensive cleaning of exam room while observing appropriate contact time as indicated for disinfecting solutions.    HPI Jocelyn [name] presents for hosp admission f/up - She was recently admitted for wound infection and cellulitis over the anterior right lower extremity.  She was treated briefly with intravenous antibiotics and has completed an outpatient course of antibiotics.  The antibiotics did not cause any side effects.  She tells me the area is healing nicely.  There is no pain or drainage associated with the wound.  She denies rash, nausea, vomiting, fever, chills, or diarrhea.  Admit date: 06/29/2020 Discharge date: 07/01/2020  Admitted From: Home Disposition:  Home  Discharge Condition:Stable CODE STATUS:DNR Diet recommendation: Heart Healthy  Brief/Interim Summary: Patient is 86 year old female with history of dementia, hyperlipidemia ,nonhemorrhagic CVA who presented for the evaluation of right lower extremity wound. As per the report, she fell a few weeks ago and hurt her right leg. Sheis currently living with her daughter. Wound care consulted. Started on IV antibiotics.  Wound does not look infected.  She was seen by PT/OT with no follow-up recommendation.  She is stable for discharge home today with oral antibiotics.  Following problems were addressed during hospitalization:  Right lower extremity wound:Does not look grossly infected. Wound care consulted the patient and putthe recommendation.   Antibiotics changed to oral. Blood cultures have been negative.. Gram stain of the wound did not show any organism. Currently she is afebrile and she does not have  leukocytosis.  Hyperlipidemia:Continue Lipitor  History of nonhemorrhagic WHQ:PRFFMBWGY with help of walker. Does not have any residual weakness. Continue Plavix and Lipitor  Dementia/depression: Currently alert and oriented. Continue Namenda, Cymbalta  Frequent falls: Seen by PT and OT and notrecommended for follow-up.  Home health RN arranged.    Discharge Diagnoses:  Active Problems:   Cellulitis  Outpatient Medications Prior to Visit  Medication Sig Dispense Refill  . atorvastatin (LIPITOR) 40 MG tablet Take 1 tablet (40 mg total) by mouth daily. 90 tablet 0  . bacitracin ointment Apply 1 application topically 2 (two) times daily. 14 g 0  . clopidogrel (PLAVIX) 75 MG tablet Take 1 tablet (75 mg total) by mouth daily. 90 tablet 0  . Cyanocobalamin (B-12 COMPLIANCE INJECTION IJ) Inject 1 Dose as directed every 30 (thirty) days.    . DULoxetine (CYMBALTA) 30 MG capsule TAKE ONE CAPSULE BY MOUTH ONE TIME DAILY (Patient taking differently: Take 30 mg by mouth daily. ) 90 capsule 0  . memantine (NAMENDA TITRATION PAK) tablet pack 5 mg/day for =1 week; 5 mg twice daily for =1 week; 15 mg/day given in 5 mg and 10 mg separated doses for =1 week; then 10 mg twice daily (Patient taking differently: Take 5-20 mg by mouth See admin instructions. 5 mg/day for =1 week; 5 mg twice daily for =1 week; 15 mg/day given in 5 mg and 10 mg separated doses for =1 week; then 10 mg twice daily) 49 tablet 0  . promethazine (PHENERGAN) 12.5 MG tablet Take 1 tablet (12.5 mg total) by mouth every 6 (six) hours as needed for nausea or vomiting. 30 tablet 0  . solifenacin (VESICARE) 10 MG tablet Take 1 tablet (10  mg total) by mouth daily. 90 tablet 1   Facility-Administered Medications Prior to Visit  Medication Dose Route Frequency Provider Last Rate Last Admin  . cyanocobalamin ((VITAMIN B-12)) injection 1,000 mcg  1,000 mcg Intramuscular Q30 days Janith Lima, MD   1,000 mcg at 06/26/20 1100     ROS Review of Systems  Constitutional: Negative for chills, diaphoresis, fatigue and fever.  HENT: Negative.   Eyes: Negative.   Respiratory: Negative for cough, chest tightness, shortness of breath and wheezing.   Cardiovascular: Negative for chest pain.  Gastrointestinal: Negative for abdominal pain.  Endocrine: Negative.   Genitourinary: Negative.   Musculoskeletal: Negative.   Skin: Positive for wound. Negative for color change.  Neurological: Negative.  Negative for dizziness, weakness and light-headedness.  Hematological: Negative for adenopathy. Does not bruise/bleed easily.  Psychiatric/Behavioral: Negative.     Objective:  BP 136/84   Pulse 81   Temp 98.6 F (37 C) (Oral)   Ht 5\' 5"  (1.651 m)   Wt 148 lb (67.1 kg)   SpO2 98%   BMI 24.63 kg/m   BP Readings from Last 3 Encounters:  07/10/20 136/84  07/01/20 (!) 152/72  06/26/20 140/88    Wt Readings from Last 3 Encounters:  07/10/20 148 lb (67.1 kg)  06/30/20 154 lb 12.2 oz (70.2 kg)  06/26/20 147 lb 12.8 oz (67 kg)    Physical Exam Vitals reviewed.  Constitutional:      Appearance: Normal appearance.  HENT:     Nose: Nose normal.     Mouth/Throat:     Mouth: Mucous membranes are moist.  Eyes:     General: No scleral icterus.    Conjunctiva/sclera: Conjunctivae normal.  Cardiovascular:     Rate and Rhythm: Normal rate and regular rhythm.     Heart sounds: No murmur heard.   Pulmonary:     Effort: Pulmonary effort is normal.     Breath sounds: No stridor. No wheezing, rhonchi or rales.  Abdominal:     General: Abdomen is flat.     Palpations: There is no mass.     Tenderness: There is no abdominal tenderness. There is no guarding.  Musculoskeletal:        General: Normal range of motion.     Cervical back: Neck supple.     Right lower leg: Deformity and laceration present. No tenderness or bony tenderness. No edema.     Left lower leg: No edema.       Legs:  Lymphadenopathy:      Cervical: No cervical adenopathy.  Skin:    General: Skin is warm and dry.  Neurological:     General: No focal deficit present.     Mental Status: She is alert.  Psychiatric:        Mood and Affect: Mood normal.        Behavior: Behavior normal.     Lab Results  Component Value Date   WBC 6.6 06/30/2020   HGB 12.7 06/30/2020   HCT 39.0 06/30/2020   PLT 299 06/30/2020   GLUCOSE 92 06/30/2020   CHOL 198 02/01/2020   TRIG 122 02/01/2020   HDL 51 02/01/2020   LDLDIRECT 128.9 04/08/2011   LDLCALC 123 (H) 02/01/2020   ALT 8 06/30/2020   AST 12 (L) 06/30/2020   NA 136 06/30/2020   K 4.1 06/30/2020   CL 105 06/30/2020   CREATININE 1.01 (H) 06/30/2020   BUN 12 06/30/2020   CO2 24 06/30/2020   TSH  1.850 06/19/2020   INR 1.0 01/31/2020   HGBA1C 4.7 (L) 02/01/2020    DG Tibia/Fibula Right  Result Date: 06/29/2020 CLINICAL DATA:  Wound infection. EXAM: RIGHT TIBIA AND FIBULA - 2 VIEW COMPARISON:  06/22/2020 FINDINGS: No fracture or bone lesion. No bone resorption to suggest osteomyelitis. Knee and ankle joints are normally aligned. Soft tissue wound noted anterior to the lower tibial shaft. No soft tissue air. IMPRESSION: 1. No fracture, bone lesion or evidence of osteomyelitis. Electronically Signed   By: Lajean Manes M.D.   On: 06/29/2020 10:59    Assessment & Plan:   Eriel was seen today for follow-up.  Diagnoses and all orders for this visit:  Vitamin B12 deficiency neuropathy (HCC) -     cyanocobalamin ((VITAMIN B-12)) injection 1,000 mcg  Essential hypertension- Her BP is well controlled.  Acute post-traumatic wound infection, initial encounter- The infection has resolved.  She will see plastic surgery later this week.  Cellulitis of other specified site- There is no evidence of infection at this time.  She has completed the antibiotics.   I am having Jocelyn [name] maintain her solifenacin, DULoxetine, atorvastatin, clopidogrel, memantine, bacitracin, promethazine,  and Cyanocobalamin (B-12 COMPLIANCE INJECTION IJ). We administered cyanocobalamin. We will continue to administer cyanocobalamin.  Meds ordered this encounter  Medications  . cyanocobalamin ((VITAMIN B-12)) injection 1,000 mcg     Follow-up: No follow-ups on file.  Scarlette Calico, MD

## 2020-07-10 NOTE — Telephone Encounter (Signed)
Pt verified by name and DOB,  normal results given per provider, pt voiced understanding all question answered. °

## 2020-07-10 NOTE — Telephone Encounter (Signed)
-----   Message from Frann Rider, NP sent at 07/03/2020 12:25 PM EST ----- Please advise patient that recent EEG negative for underlying abnormality or seizure activity

## 2020-07-11 ENCOUNTER — Encounter: Payer: Self-pay | Admitting: Internal Medicine

## 2020-07-11 NOTE — Patient Instructions (Signed)
Wound Care, Adult Taking care of your wound properly can help to prevent pain, infection, and scarring. It can also help your wound to heal more quickly. How to care for your wound Wound care      Follow instructions from your health care provider about how to take care of your wound. Make sure you: ? Wash your hands with soap and water before you change the bandage (dressing). If soap and water are not available, use hand sanitizer. ? Change your dressing as told by your health care provider. ? Leave stitches (sutures), skin glue, or adhesive strips in place. These skin closures may need to stay in place for 2 weeks or longer. If adhesive strip edges start to loosen and curl up, you may trim the loose edges. Do not remove adhesive strips completely unless your health care provider tells you to do that.  Check your wound area every day for signs of infection. Check for: ? Redness, swelling, or pain. ? Fluid or blood. ? Warmth. ? Pus or a bad smell.  Ask your health care provider if you should clean the wound with mild soap and water. Doing this may include: ? Using a clean towel to pat the wound dry after cleaning it. Do not rub or scrub the wound. ? Applying a cream or ointment. Do this only as told by your health care provider. ? Covering the incision with a clean dressing.  Ask your health care provider when you can leave the wound uncovered.  Keep the dressing dry until your health care provider says it can be removed. Do not take baths, swim, use a hot tub, or do anything that would put the wound underwater until your health care provider approves. Ask your health care provider if you can take showers. You may only be allowed to take sponge baths. Medicines   If you were prescribed an antibiotic medicine, cream, or ointment, take or use the antibiotic as told by your health care provider. Do not stop taking or using the antibiotic even if your condition improves.  Take  over-the-counter and prescription medicines only as told by your health care provider. If you were prescribed pain medicine, take it 30 or more minutes before you do any wound care or as told by your health care provider. General instructions  Return to your normal activities as told by your health care provider. Ask your health care provider what activities are safe.  Do not scratch or pick at the wound.  Do not use any products that contain nicotine or tobacco, such as cigarettes and e-cigarettes. These may delay wound healing. If you need help quitting, ask your health care provider.  Keep all follow-up visits as told by your health care provider. This is important.  Eat a diet that includes protein, vitamin A, vitamin C, and other nutrient-rich foods to help the wound heal. ? Foods rich in protein include meat, dairy, beans, nuts, and other sources. ? Foods rich in vitamin A include carrots and dark green, leafy vegetables. ? Foods rich in vitamin C include citrus, tomatoes, and other fruits and vegetables. ? Nutrient-rich foods have protein, carbohydrates, fat, vitamins, or minerals. Eat a variety of healthy foods including vegetables, fruits, and whole grains. Contact a health care provider if:  You received a tetanus shot and you have swelling, severe pain, redness, or bleeding at the injection site.  Your pain is not controlled with medicine.  You have redness, swelling, or pain around the wound.    You have fluid or blood coming from the wound.  Your wound feels warm to the touch.  You have pus or a bad smell coming from the wound.  You have a fever or chills.  You are nauseous or you vomit.  You are dizzy. Get help right away if:  You have a red streak going away from your wound.  The edges of the wound open up and separate.  Your wound is bleeding, and the bleeding does not stop with gentle pressure.  You have a rash.  You faint.  You have trouble  breathing. Summary  Always wash your hands with soap and water before changing your bandage (dressing).  To help with healing, eat foods that are rich in protein, vitamin A, vitamin C, and other nutrients.  Check your wound every day for signs of infection. Contact your health care provider if you suspect that your wound is infected. This information is not intended to replace advice given to you by your health care provider. Make sure you discuss any questions you have with your health care provider. Document Revised: 11/16/2018 Document Reviewed: 02/13/2016 Elsevier Patient Education  2020 [employer] Inc[employer]  

## 2020-07-13 ENCOUNTER — Encounter: Payer: Self-pay | Admitting: Plastic Surgery

## 2020-07-13 ENCOUNTER — Other Ambulatory Visit: Payer: Self-pay

## 2020-07-13 ENCOUNTER — Ambulatory Visit (INDEPENDENT_AMBULATORY_CARE_PROVIDER_SITE_OTHER): Payer: Medicare Other | Admitting: Plastic Surgery

## 2020-07-13 ENCOUNTER — Telehealth: Payer: Self-pay

## 2020-07-13 VITALS — BP 134/72 | HR 88 | Temp 96.8°F | Ht 66.0 in | Wt 149.0 lb

## 2020-07-13 DIAGNOSIS — S81801A Unspecified open wound, right lower leg, initial encounter: Secondary | ICD-10-CM

## 2020-07-13 NOTE — Progress Notes (Signed)
Referring Provider Janith Lima, MD [address] [place],  [place] [address]   CC:  Chief Complaint  Patient presents with  . Consult      Jocelyn [name] is an 86 y.o. female.  HPI: Patient presents for consultation for right leg wound.  She had a fall and scraped the anterior aspect of her shin almost 1 month ago.  She was seen in the hospital and has been evaluated for stroke and seizure activity in the interim.  She was put on antibiotics for a time by the internal medicine physicians.  She has seen her primary care physician a couple times in follow-up for this wound and was referred here to have me evaluated.  She is tender in the area and has been doing K-Y jelly and 4 x 4 for dressing up until this point.  She is hoping to avoid surgery if possible  Allergies  Allergen Reactions  . Codeine Itching and Nausea And Vomiting  . Tape Rash    Tears the skin    Outpatient Encounter Medications as of 07/13/2020  Medication Sig Note  . atorvastatin (LIPITOR) 40 MG tablet Take 1 tablet (40 mg total) by mouth daily.   . bacitracin ointment Apply 1 application topically 2 (two) times daily.   . clopidogrel (PLAVIX) 75 MG tablet Take 1 tablet (75 mg total) by mouth daily.   . Cyanocobalamin (B-12 COMPLIANCE INJECTION IJ) Inject 1 Dose as directed every 30 (thirty) days.   . DULoxetine (CYMBALTA) 30 MG capsule TAKE ONE CAPSULE BY MOUTH ONE TIME DAILY (Patient taking differently: Take 30 mg by mouth daily. )   . memantine (NAMENDA TITRATION PAK) tablet pack 5 mg/day for =1 week; 5 mg twice daily for =1 week; 15 mg/day given in 5 mg and 10 mg separated doses for =1 week; then 10 mg twice daily (Patient taking differently: Take 5-20 mg by mouth See admin instructions. 5 mg/day for =1 week; 5 mg twice daily for =1 week; 15 mg/day given in 5 mg and 10 mg separated doses for =1 week; then 10 mg twice daily) 06/29/2020: Starts first day of 5mg  twice daily on 06/29/2020  . promethazine (PHENERGAN)  12.5 MG tablet Take 1 tablet (12.5 mg total) by mouth every 6 (six) hours as needed for nausea or vomiting. (Patient not taking: Reported on 07/13/2020)   . solifenacin (VESICARE) 10 MG tablet Take 1 tablet (10 mg total) by mouth daily.    Facility-Administered Encounter Medications as of 07/13/2020  Medication  . cyanocobalamin ((VITAMIN B-12)) injection 1,000 mcg     Past Medical History:  Diagnosis Date  . HTN (hypertension)   . Hyperlipidemia   . Urinary incontinence     Past Surgical History:  Procedure Laterality Date  . ABDOMINAL HYSTERECTOMY    . TONSILLECTOMY      Family History  Problem Relation Age of Onset  . Diabetes Other        1st degree relative  . Hyperlipidemia Other   . Stroke Other        1st degree Female relative <50  . Cancer Neg Hx   . Early death Neg Hx   . Heart disease Neg Hx   . Hypertension Neg Hx   . Kidney disease Neg Hx     Social History   Social History Narrative   Regular Exercise-no           Review of Systems General: Denies fevers, chills, weight loss CV: Denies  chest pain, shortness of breath, palpitations  Physical Exam Vitals with BMI 07/13/2020 07/10/2020 07/01/2020  Height 5\' 6"  5\' 5"  -  Weight 149 lbs 148 lbs -  BMI 61.22 44.97 -  Systolic 530 051 102  Diastolic 72 84 72  Pulse 88 81 75    General:  No acute distress,  Alert and oriented, Non-Toxic, Normal speech and affect Right leg: Toes appear well perfused with sensation in the foot.  She has a 4 x 2 cm wound that is oblique in the middle third of the leg.  There does not appear to be any exposed bone or tendon but it looks like it is down to the fascia level.  There is some surrounding granulation tissue in the wound base is mostly healthy healthy.  I do not see any purulence.  There is some surrounding reactive erythema.  Assessment/Plan Patient presents with a right lower extremity wound after trauma.  We discussed surgical and nonsurgical options but  ultimately she wants to avoid surgery if possible and I think that is a good idea for her given her medical comorbidities.  I recommended collagenase, Telfa, 4 x 4's and a leg wrap.  We will plan to change this daily and follow-up with her again in 3 weeks.  She knows to call with any concerns in the interim.  All of her questions were answered.  Cindra Presume 07/13/2020, 12:49 PM

## 2020-07-13 NOTE — Telephone Encounter (Signed)
Orders for wound/dressing care faxed to [facility]  Ph# [phone] / fax # [phone]  per Dr. Claudia Desanctis as follows: Right lower/leg wound Home Health 2x/week Change dressing daily  1) clean area with saline 2) apply Collagenase/Santyl ** use hydrogel until Collagenase is available** 3) telfa/non stick pad 4) 4x4 gauze dressing 5) ABD pad 6) kerlix wrap 7) ace wrap F/U in 3 weeks These orders signed/ faxed to Home health Copy to pt & copy scanned into chart

## 2020-07-18 ENCOUNTER — Other Ambulatory Visit: Payer: Self-pay | Admitting: Adult Health

## 2020-07-18 ENCOUNTER — Other Ambulatory Visit: Payer: Self-pay | Admitting: Internal Medicine

## 2020-07-18 ENCOUNTER — Encounter: Payer: Self-pay | Admitting: Adult Health

## 2020-07-18 DIAGNOSIS — F322 Major depressive disorder, single episode, severe without psychotic features: Secondary | ICD-10-CM

## 2020-07-18 DIAGNOSIS — N3281 Overactive bladder: Secondary | ICD-10-CM

## 2020-07-18 DIAGNOSIS — I63531 Cerebral infarction due to unspecified occlusion or stenosis of right posterior cerebral artery: Secondary | ICD-10-CM

## 2020-07-18 DIAGNOSIS — R4189 Other symptoms and signs involving cognitive functions and awareness: Secondary | ICD-10-CM

## 2020-07-18 DIAGNOSIS — M8949 Other hypertrophic osteoarthropathy, multiple sites: Secondary | ICD-10-CM

## 2020-07-18 DIAGNOSIS — M159 Polyosteoarthritis, unspecified: Secondary | ICD-10-CM

## 2020-07-18 MED ORDER — DULOXETINE HCL 60 MG PO CPEP: 60.0000 mg | ORAL_CAPSULE | Freq: Every day | ORAL | 1 refills | Status: DC

## 2020-07-18 MED ORDER — CLOPIDOGREL BISULFATE 75 MG PO TABS: 75.0000 mg | ORAL_TABLET | Freq: Every day | ORAL | 0 refills | Status: DC

## 2020-07-18 MED ORDER — MEMANTINE HCL 10 MG PO TABS: 10.0000 mg | ORAL_TABLET | Freq: Two times a day (BID) | ORAL | 3 refills | Status: DC

## 2020-07-18 MED ORDER — SOLIFENACIN SUCCINATE 10 MG PO TABS: 10.0000 mg | ORAL_TABLET | Freq: Every day | ORAL | 1 refills | Status: DC

## 2020-07-18 NOTE — Telephone Encounter (Signed)
Daughter, Lynelle Smoke of patient is calling about needing refill for medication memantine (NAMENDA TITRATION PAK)   Please call daughter at [phone], if any issues.  She is requesting if this can get handled before Saturday(when Rx runs out)

## 2020-07-20 ENCOUNTER — Telehealth: Payer: Self-pay

## 2020-07-20 ENCOUNTER — Encounter: Payer: Self-pay | Admitting: Internal Medicine

## 2020-07-20 ENCOUNTER — Other Ambulatory Visit: Payer: Self-pay | Admitting: Internal Medicine

## 2020-07-20 DIAGNOSIS — F0280 Dementia in other diseases classified elsewhere without behavioral disturbance: Secondary | ICD-10-CM | POA: Diagnosis not present

## 2020-07-20 DIAGNOSIS — W19XXXD Unspecified fall, subsequent encounter: Secondary | ICD-10-CM | POA: Diagnosis not present

## 2020-07-20 DIAGNOSIS — Z90710 Acquired absence of both cervix and uterus: Secondary | ICD-10-CM | POA: Diagnosis not present

## 2020-07-20 DIAGNOSIS — R296 Repeated falls: Secondary | ICD-10-CM | POA: Diagnosis not present

## 2020-07-20 DIAGNOSIS — S81801D Unspecified open wound, right lower leg, subsequent encounter: Secondary | ICD-10-CM | POA: Diagnosis not present

## 2020-07-20 DIAGNOSIS — I10 Essential (primary) hypertension: Secondary | ICD-10-CM | POA: Diagnosis not present

## 2020-07-20 DIAGNOSIS — S8781XA Crushing injury of right lower leg, initial encounter: Secondary | ICD-10-CM | POA: Diagnosis not present

## 2020-07-20 DIAGNOSIS — Z8673 Personal history of transient ischemic attack (TIA), and cerebral infarction without residual deficits: Secondary | ICD-10-CM | POA: Diagnosis not present

## 2020-07-20 DIAGNOSIS — Z87891 Personal history of nicotine dependence: Secondary | ICD-10-CM | POA: Diagnosis not present

## 2020-07-20 DIAGNOSIS — E538 Deficiency of other specified B group vitamins: Secondary | ICD-10-CM | POA: Diagnosis not present

## 2020-07-20 DIAGNOSIS — F32A Depression, unspecified: Secondary | ICD-10-CM | POA: Diagnosis not present

## 2020-07-20 DIAGNOSIS — G629 Polyneuropathy, unspecified: Secondary | ICD-10-CM | POA: Diagnosis not present

## 2020-07-20 DIAGNOSIS — E785 Hyperlipidemia, unspecified: Secondary | ICD-10-CM

## 2020-07-20 DIAGNOSIS — Z7902 Long term (current) use of antithrombotics/antiplatelets: Secondary | ICD-10-CM | POA: Diagnosis not present

## 2020-07-20 DIAGNOSIS — Z4800 Encounter for change or removal of nonsurgical wound dressing: Secondary | ICD-10-CM | POA: Diagnosis not present

## 2020-07-20 DIAGNOSIS — N3281 Overactive bladder: Secondary | ICD-10-CM | POA: Diagnosis not present

## 2020-07-20 DIAGNOSIS — Z9181 History of falling: Secondary | ICD-10-CM | POA: Diagnosis not present

## 2020-07-20 DIAGNOSIS — Z9089 Acquired absence of other organs: Secondary | ICD-10-CM | POA: Diagnosis not present

## 2020-07-20 DIAGNOSIS — I63531 Cerebral infarction due to unspecified occlusion or stenosis of right posterior cerebral artery: Secondary | ICD-10-CM

## 2020-07-20 DIAGNOSIS — R32 Unspecified urinary incontinence: Secondary | ICD-10-CM | POA: Diagnosis not present

## 2020-07-20 MED ORDER — ATORVASTATIN CALCIUM 40 MG PO TABS: 40.0000 mg | ORAL_TABLET | Freq: Every day | ORAL | 1 refills | Status: DC

## 2020-07-20 NOTE — Telephone Encounter (Signed)
Sharyn Lull called from [facility] to get verbal orders for the patient.  She would like to have orders for wound care two times per week for nine weeks.  Regarding santyl, Sharyn Lull said that Dr. Claudia Desanctis will need to call the prescription in to the patient's pharmacy for them to pick up.  Sharyn Lull said that she does have secure voicemail, so we may leave a message when we call her back at [phone].

## 2020-07-24 ENCOUNTER — Other Ambulatory Visit: Payer: Self-pay | Admitting: Internal Medicine

## 2020-07-24 DIAGNOSIS — E785 Hyperlipidemia, unspecified: Secondary | ICD-10-CM | POA: Diagnosis not present

## 2020-07-24 DIAGNOSIS — S81801D Unspecified open wound, right lower leg, subsequent encounter: Secondary | ICD-10-CM | POA: Diagnosis not present

## 2020-07-24 DIAGNOSIS — E538 Deficiency of other specified B group vitamins: Secondary | ICD-10-CM | POA: Diagnosis not present

## 2020-07-24 DIAGNOSIS — G629 Polyneuropathy, unspecified: Secondary | ICD-10-CM | POA: Diagnosis not present

## 2020-07-24 DIAGNOSIS — I10 Essential (primary) hypertension: Secondary | ICD-10-CM | POA: Diagnosis not present

## 2020-07-24 DIAGNOSIS — F0280 Dementia in other diseases classified elsewhere without behavioral disturbance: Secondary | ICD-10-CM | POA: Diagnosis not present

## 2020-07-25 ENCOUNTER — Telehealth: Payer: Self-pay | Admitting: Plastic Surgery

## 2020-07-25 DIAGNOSIS — F32A Depression, unspecified: Secondary | ICD-10-CM

## 2020-07-25 DIAGNOSIS — I10 Essential (primary) hypertension: Secondary | ICD-10-CM

## 2020-07-25 DIAGNOSIS — E785 Hyperlipidemia, unspecified: Secondary | ICD-10-CM

## 2020-07-25 DIAGNOSIS — S81801D Unspecified open wound, right lower leg, subsequent encounter: Secondary | ICD-10-CM

## 2020-07-25 DIAGNOSIS — Z9181 History of falling: Secondary | ICD-10-CM

## 2020-07-25 DIAGNOSIS — F0390 Unspecified dementia without behavioral disturbance: Secondary | ICD-10-CM

## 2020-07-25 DIAGNOSIS — W19XXXD Unspecified fall, subsequent encounter: Secondary | ICD-10-CM

## 2020-07-25 DIAGNOSIS — R32 Unspecified urinary incontinence: Secondary | ICD-10-CM

## 2020-07-25 DIAGNOSIS — Z7902 Long term (current) use of antithrombotics/antiplatelets: Secondary | ICD-10-CM

## 2020-07-25 DIAGNOSIS — Z8673 Personal history of transient ischemic attack (TIA), and cerebral infarction without residual deficits: Secondary | ICD-10-CM

## 2020-07-25 DIAGNOSIS — L03115 Cellulitis of right lower limb: Secondary | ICD-10-CM

## 2020-07-25 NOTE — Telephone Encounter (Signed)
Daughter, Jocelyn [name], called to advise that [facility] lost the order for her mother's home health care and that the order should be faxed to [phone]. They have not ordered the gel Dr. Claudia Desanctis wanted her to have and they are trying to come out once a week as opposed to twice a week like he ordered. Please fax order to that number since that is the office who will be organizing the care for her location. Please call daughter back to advise when completed.

## 2020-07-25 NOTE — Telephone Encounter (Signed)
I consulted with Dr. Claudia Desanctis & he will forward the prescription for Santyl to pt's pharmacy.

## 2020-07-26 ENCOUNTER — Other Ambulatory Visit: Payer: Self-pay | Admitting: Plastic Surgery

## 2020-07-26 MED ORDER — COLLAGENASE 250 UNIT/GM EX OINT: 1.0000 "application " | TOPICAL_OINTMENT | Freq: Every day | CUTANEOUS | 0 refills | Status: DC

## 2020-07-26 NOTE — Telephone Encounter (Signed)
Orders forwarded to [facility] with [facility] for updated wound/care orders: Per Sharyn Lull: [facility] will only come to pt's house once/week for dressing/wound care- they will verify that pt &/or her family will assist in wound care for the other days. Orders are the same for the dressing changes- every day- The only change is for homehealth to come 1-2x/week These orders were signed by Dr. Claudia Desanctis & faxed to attention- Sharyn Lull Copy will be scanned into chart

## 2020-07-27 DIAGNOSIS — S81801D Unspecified open wound, right lower leg, subsequent encounter: Secondary | ICD-10-CM | POA: Diagnosis not present

## 2020-07-27 DIAGNOSIS — G629 Polyneuropathy, unspecified: Secondary | ICD-10-CM | POA: Diagnosis not present

## 2020-07-27 DIAGNOSIS — F0280 Dementia in other diseases classified elsewhere without behavioral disturbance: Secondary | ICD-10-CM | POA: Diagnosis not present

## 2020-07-27 DIAGNOSIS — E538 Deficiency of other specified B group vitamins: Secondary | ICD-10-CM | POA: Diagnosis not present

## 2020-07-27 DIAGNOSIS — I10 Essential (primary) hypertension: Secondary | ICD-10-CM | POA: Diagnosis not present

## 2020-07-27 DIAGNOSIS — E785 Hyperlipidemia, unspecified: Secondary | ICD-10-CM | POA: Diagnosis not present

## 2020-08-01 DIAGNOSIS — E785 Hyperlipidemia, unspecified: Secondary | ICD-10-CM | POA: Diagnosis not present

## 2020-08-01 DIAGNOSIS — F0280 Dementia in other diseases classified elsewhere without behavioral disturbance: Secondary | ICD-10-CM | POA: Diagnosis not present

## 2020-08-01 DIAGNOSIS — E538 Deficiency of other specified B group vitamins: Secondary | ICD-10-CM | POA: Diagnosis not present

## 2020-08-01 DIAGNOSIS — S81801D Unspecified open wound, right lower leg, subsequent encounter: Secondary | ICD-10-CM | POA: Diagnosis not present

## 2020-08-01 DIAGNOSIS — G629 Polyneuropathy, unspecified: Secondary | ICD-10-CM | POA: Diagnosis not present

## 2020-08-01 DIAGNOSIS — I10 Essential (primary) hypertension: Secondary | ICD-10-CM | POA: Diagnosis not present

## 2020-08-02 ENCOUNTER — Telehealth: Payer: Self-pay | Admitting: *Deleted

## 2020-08-02 NOTE — Progress Notes (Signed)
Patient is an 86 year old female here for follow-up for her right leg wound.  At last visit on 12/2, wound was approximately 4 x 2 cm in the middle third of the leg down to the fascial level.  Patient presents today with her daughter.  Reports she is doing very well.  Denies fever/chills, nausea/vomiting, leg pain.  Wound shows signs of good improvement.  Good granulation tissue formation and decrease in overall wound size, measuring today 3.5 x 1 cm.  There are no signs of infection.  Chronic wound erythema present on surrounding skin.  Continue Daily dressing changes consisting of collagenase, Telfa, 4 x 4's, and leg wrap.  Follow-up in 1 month.  Return precautions provided.  Call office with any questions/concerns.

## 2020-08-02 NOTE — Telephone Encounter (Signed)
Received on (07/27/20) via of fax [place] from [facility].  Requesting signature and return.  Given to provider to sign.    Orders signed and faxed back to [facility].  Confirmation received and copy scanned into the chart.//AB/CMA

## 2020-08-03 ENCOUNTER — Other Ambulatory Visit: Payer: Self-pay

## 2020-08-03 ENCOUNTER — Ambulatory Visit (INDEPENDENT_AMBULATORY_CARE_PROVIDER_SITE_OTHER): Payer: Medicare Other | Admitting: Plastic Surgery

## 2020-08-03 ENCOUNTER — Encounter: Payer: Self-pay | Admitting: Plastic Surgery

## 2020-08-03 VITALS — BP 139/87 | HR 80 | Temp 98.1°F

## 2020-08-03 DIAGNOSIS — S81801D Unspecified open wound, right lower leg, subsequent encounter: Secondary | ICD-10-CM

## 2020-08-07 ENCOUNTER — Other Ambulatory Visit: Payer: Self-pay

## 2020-08-08 ENCOUNTER — Ambulatory Visit (INDEPENDENT_AMBULATORY_CARE_PROVIDER_SITE_OTHER): Payer: Medicare Other | Admitting: Internal Medicine

## 2020-08-08 ENCOUNTER — Encounter: Payer: Self-pay | Admitting: Internal Medicine

## 2020-08-08 VITALS — BP 118/76 | HR 86 | Temp 98.2°F | Ht 66.0 in | Wt 144.0 lb

## 2020-08-08 DIAGNOSIS — I639 Cerebral infarction, unspecified: Secondary | ICD-10-CM

## 2020-08-08 DIAGNOSIS — E538 Deficiency of other specified B group vitamins: Secondary | ICD-10-CM | POA: Diagnosis not present

## 2020-08-08 DIAGNOSIS — G63 Polyneuropathy in diseases classified elsewhere: Secondary | ICD-10-CM | POA: Diagnosis not present

## 2020-08-08 DIAGNOSIS — F322 Major depressive disorder, single episode, severe without psychotic features: Secondary | ICD-10-CM

## 2020-08-08 MED ORDER — CYANOCOBALAMIN 1000 MCG/ML IJ SOLN
1000.0000 ug | Freq: Once | INTRAMUSCULAR | Status: AC
  Administered 2020-08-08: 10:00:00 1000 ug via INTRAMUSCULAR

## 2020-08-08 NOTE — Progress Notes (Signed)
Subjective:  Patient ID: Jocelyn [name], female    DOB: [birth year]  Age: 86 y.o. MRN: [id number]  CC: Anemia and Depression  This visit occurred during the SARS-CoV-2 public health emergency.  Safety protocols were in place, including screening questions prior to the visit, additional usage of staff PPE, and extensive cleaning of exam room while observing appropriate contact time as indicated for disinfecting solutions.    HPI Jocelyn [name] presents for f/up - She tells me the wound on her right lower extremity is healing nicely.  She is due for a B12 injection.  She tells me her mood is getting better since she started taking duloxetine.  Outpatient Medications Prior to Visit  Medication Sig Dispense Refill  . atorvastatin (LIPITOR) 40 MG tablet Take 1 tablet (40 mg total) by mouth daily. 90 tablet 1  . clopidogrel (PLAVIX) 75 MG tablet Take 1 tablet (75 mg total) by mouth daily. 90 tablet 0  . collagenase (SANTYL) ointment Apply 1 application topically daily. 15 g 0  . Cyanocobalamin (B-12 COMPLIANCE INJECTION IJ) Inject 1 Dose as directed every 30 (thirty) days.    . DULoxetine (CYMBALTA) 60 MG capsule Take 1 capsule (60 mg total) by mouth daily. 90 capsule 1  . memantine (NAMENDA) 10 MG tablet Take 1 tablet (10 mg total) by mouth 2 (two) times daily. 180 tablet 3  . solifenacin (VESICARE) 10 MG tablet Take 1 tablet (10 mg total) by mouth daily. 90 tablet 1   No facility-administered medications prior to visit.    ROS Review of Systems  Constitutional: Positive for fatigue. Negative for diaphoresis.  HENT: Negative.   Eyes: Negative.   Respiratory: Negative for cough, chest tightness, shortness of breath and wheezing.   Cardiovascular: Negative for chest pain, palpitations and leg swelling.  Gastrointestinal: Negative for abdominal pain, constipation, diarrhea, nausea and vomiting.  Endocrine: Negative.   Genitourinary: Negative.  Negative for difficulty urinating.   Musculoskeletal: Negative for arthralgias and myalgias.  Skin: Negative.   Neurological: Negative.  Negative for dizziness, weakness, light-headedness and headaches.  Hematological: Negative for adenopathy. Does not bruise/bleed easily.  Psychiatric/Behavioral: Positive for decreased concentration and dysphoric mood. Negative for confusion and suicidal ideas. The patient is not nervous/anxious.     Objective:  BP 118/76   Pulse 86   Temp 98.2 F (36.8 C) (Oral)   Ht 5\' 6"  (1.676 m)   Wt 144 lb (65.3 kg)   SpO2 94%   BMI 23.24 kg/m   BP Readings from Last 3 Encounters:  08/08/20 118/76  08/03/20 139/87  07/13/20 134/72    Wt Readings from Last 3 Encounters:  08/08/20 144 lb (65.3 kg)  07/13/20 149 lb (67.6 kg)  07/10/20 148 lb (67.1 kg)    Physical Exam HENT:     Nose: Nose normal.     Mouth/Throat:     Mouth: Mucous membranes are moist.  Eyes:     General: No scleral icterus.    Conjunctiva/sclera: Conjunctivae normal.  Cardiovascular:     Rate and Rhythm: Normal rate and regular rhythm.     Pulses: Normal pulses.     Heart sounds: No murmur heard.   Pulmonary:     Effort: Pulmonary effort is normal.     Breath sounds: No stridor. No wheezing, rhonchi or rales.  Abdominal:     General: Abdomen is flat. Bowel sounds are normal.     Palpations: There is no mass.     Tenderness: There is no abdominal tenderness.  There is no guarding.  Musculoskeletal:        General: Normal range of motion.     Cervical back: Neck supple.     Right lower leg: No edema.     Left lower leg: No edema.  Lymphadenopathy:     Cervical: No cervical adenopathy.  Skin:    General: Skin is warm and dry.     Coloration: Skin is not pale.  Neurological:     General: No focal deficit present.     Mental Status: She is alert. Mental status is at baseline.  Psychiatric:        Mood and Affect: Mood normal.        Behavior: Behavior normal.     Lab Results  Component Value Date    WBC 6.6 06/30/2020   HGB 12.7 06/30/2020   HCT 39.0 06/30/2020   PLT 299 06/30/2020   GLUCOSE 92 06/30/2020   CHOL 198 02/01/2020   TRIG 122 02/01/2020   HDL 51 02/01/2020   LDLDIRECT 128.9 04/08/2011   LDLCALC 123 (H) 02/01/2020   ALT 8 06/30/2020   AST 12 (L) 06/30/2020   NA 136 06/30/2020   K 4.1 06/30/2020   CL 105 06/30/2020   CREATININE 1.01 (H) 06/30/2020   BUN 12 06/30/2020   CO2 24 06/30/2020   TSH 1.850 06/19/2020   INR 1.0 01/31/2020   HGBA1C 4.7 (L) 02/01/2020    DG Tibia/Fibula Right  Result Date: 06/29/2020 CLINICAL DATA:  Wound infection. EXAM: RIGHT TIBIA AND FIBULA - 2 VIEW COMPARISON:  06/22/2020 FINDINGS: No fracture or bone lesion. No bone resorption to suggest osteomyelitis. Knee and ankle joints are normally aligned. Soft tissue wound noted anterior to the lower tibial shaft. No soft tissue air. IMPRESSION: 1. No fracture, bone lesion or evidence of osteomyelitis. Electronically Signed   By: Lajean Manes M.D.   On: 06/29/2020 10:59    Assessment & Plan:   Jocelyn [name] was seen today for anemia and depression.  Diagnoses and all orders for this visit:  Vitamin B12 deficiency neuropathy ([place])- She agrees to receive a B12 injection every 3 to 4 weeks. -     cyanocobalamin ((VITAMIN B-12)) injection 1,000 mcg  [name] severe episode of major depressive disorder without psychotic features without prior episode ([place])- Improvement noted on the [name] dose of duloxetine.   I am having Jocelyn [name] maintain her Cyanocobalamin (B-12 COMPLIANCE INJECTION IJ), memantine, DULoxetine, clopidogrel, solifenacin, atorvastatin, and collagenase. We administered cyanocobalamin.  Meds ordered this encounter  Medications  . cyanocobalamin ((VITAMIN B-12)) injection 1,000 mcg     Follow-up: Return in about 4 weeks (around 09/05/2020).  Jocelyn Calico, MD

## 2020-08-09 ENCOUNTER — Ambulatory Visit: Payer: Medicare Other | Admitting: Internal Medicine

## 2020-08-09 DIAGNOSIS — E785 Hyperlipidemia, unspecified: Secondary | ICD-10-CM | POA: Diagnosis not present

## 2020-08-09 DIAGNOSIS — F0280 Dementia in other diseases classified elsewhere without behavioral disturbance: Secondary | ICD-10-CM | POA: Diagnosis not present

## 2020-08-09 DIAGNOSIS — G629 Polyneuropathy, unspecified: Secondary | ICD-10-CM | POA: Diagnosis not present

## 2020-08-09 DIAGNOSIS — S81801D Unspecified open wound, right lower leg, subsequent encounter: Secondary | ICD-10-CM | POA: Diagnosis not present

## 2020-08-09 DIAGNOSIS — I10 Essential (primary) hypertension: Secondary | ICD-10-CM | POA: Diagnosis not present

## 2020-08-09 DIAGNOSIS — E538 Deficiency of other specified B group vitamins: Secondary | ICD-10-CM | POA: Diagnosis not present

## 2020-08-18 DIAGNOSIS — G629 Polyneuropathy, unspecified: Secondary | ICD-10-CM | POA: Diagnosis not present

## 2020-08-18 DIAGNOSIS — E538 Deficiency of other specified B group vitamins: Secondary | ICD-10-CM | POA: Diagnosis not present

## 2020-08-18 DIAGNOSIS — I10 Essential (primary) hypertension: Secondary | ICD-10-CM | POA: Diagnosis not present

## 2020-08-18 DIAGNOSIS — F0280 Dementia in other diseases classified elsewhere without behavioral disturbance: Secondary | ICD-10-CM | POA: Diagnosis not present

## 2020-08-18 DIAGNOSIS — S81801D Unspecified open wound, right lower leg, subsequent encounter: Secondary | ICD-10-CM | POA: Diagnosis not present

## 2020-08-18 DIAGNOSIS — E785 Hyperlipidemia, unspecified: Secondary | ICD-10-CM | POA: Diagnosis not present

## 2020-08-19 DIAGNOSIS — N3281 Overactive bladder: Secondary | ICD-10-CM | POA: Diagnosis not present

## 2020-08-19 DIAGNOSIS — S81801D Unspecified open wound, right lower leg, subsequent encounter: Secondary | ICD-10-CM | POA: Diagnosis not present

## 2020-08-19 DIAGNOSIS — R32 Unspecified urinary incontinence: Secondary | ICD-10-CM | POA: Diagnosis not present

## 2020-08-19 DIAGNOSIS — F0280 Dementia in other diseases classified elsewhere without behavioral disturbance: Secondary | ICD-10-CM | POA: Diagnosis not present

## 2020-08-19 DIAGNOSIS — I10 Essential (primary) hypertension: Secondary | ICD-10-CM | POA: Diagnosis not present

## 2020-08-19 DIAGNOSIS — E538 Deficiency of other specified B group vitamins: Secondary | ICD-10-CM | POA: Diagnosis not present

## 2020-08-19 DIAGNOSIS — E785 Hyperlipidemia, unspecified: Secondary | ICD-10-CM | POA: Diagnosis not present

## 2020-08-19 DIAGNOSIS — Z9181 History of falling: Secondary | ICD-10-CM | POA: Diagnosis not present

## 2020-08-19 DIAGNOSIS — Z90710 Acquired absence of both cervix and uterus: Secondary | ICD-10-CM | POA: Diagnosis not present

## 2020-08-19 DIAGNOSIS — Z87891 Personal history of nicotine dependence: Secondary | ICD-10-CM | POA: Diagnosis not present

## 2020-08-19 DIAGNOSIS — Z8673 Personal history of transient ischemic attack (TIA), and cerebral infarction without residual deficits: Secondary | ICD-10-CM | POA: Diagnosis not present

## 2020-08-19 DIAGNOSIS — R296 Repeated falls: Secondary | ICD-10-CM | POA: Diagnosis not present

## 2020-08-19 DIAGNOSIS — Z7902 Long term (current) use of antithrombotics/antiplatelets: Secondary | ICD-10-CM | POA: Diagnosis not present

## 2020-08-19 DIAGNOSIS — Z4800 Encounter for change or removal of nonsurgical wound dressing: Secondary | ICD-10-CM | POA: Diagnosis not present

## 2020-08-19 DIAGNOSIS — F32A Depression, unspecified: Secondary | ICD-10-CM | POA: Diagnosis not present

## 2020-08-19 DIAGNOSIS — G629 Polyneuropathy, unspecified: Secondary | ICD-10-CM | POA: Diagnosis not present

## 2020-08-19 DIAGNOSIS — W19XXXD Unspecified fall, subsequent encounter: Secondary | ICD-10-CM | POA: Diagnosis not present

## 2020-08-19 DIAGNOSIS — Z9089 Acquired absence of other organs: Secondary | ICD-10-CM | POA: Diagnosis not present

## 2020-08-24 DIAGNOSIS — E785 Hyperlipidemia, unspecified: Secondary | ICD-10-CM | POA: Diagnosis not present

## 2020-08-24 DIAGNOSIS — F0280 Dementia in other diseases classified elsewhere without behavioral disturbance: Secondary | ICD-10-CM | POA: Diagnosis not present

## 2020-08-24 DIAGNOSIS — E538 Deficiency of other specified B group vitamins: Secondary | ICD-10-CM | POA: Diagnosis not present

## 2020-08-24 DIAGNOSIS — I10 Essential (primary) hypertension: Secondary | ICD-10-CM | POA: Diagnosis not present

## 2020-08-24 DIAGNOSIS — G629 Polyneuropathy, unspecified: Secondary | ICD-10-CM | POA: Diagnosis not present

## 2020-08-24 DIAGNOSIS — S81801D Unspecified open wound, right lower leg, subsequent encounter: Secondary | ICD-10-CM | POA: Diagnosis not present

## 2020-08-30 DIAGNOSIS — G629 Polyneuropathy, unspecified: Secondary | ICD-10-CM | POA: Diagnosis not present

## 2020-08-30 DIAGNOSIS — E538 Deficiency of other specified B group vitamins: Secondary | ICD-10-CM | POA: Diagnosis not present

## 2020-08-30 DIAGNOSIS — S81801D Unspecified open wound, right lower leg, subsequent encounter: Secondary | ICD-10-CM | POA: Diagnosis not present

## 2020-08-30 DIAGNOSIS — I10 Essential (primary) hypertension: Secondary | ICD-10-CM | POA: Diagnosis not present

## 2020-08-30 DIAGNOSIS — E785 Hyperlipidemia, unspecified: Secondary | ICD-10-CM | POA: Diagnosis not present

## 2020-08-30 DIAGNOSIS — F0280 Dementia in other diseases classified elsewhere without behavioral disturbance: Secondary | ICD-10-CM | POA: Diagnosis not present

## 2020-09-04 NOTE — Progress Notes (Signed)
Patient is an 87 year old female here for follow-up for her right leg wound.  At last visit on 12/23, the wound was approximately 3.5 x 1 cm with good granulation tissue present.  She has been doing daily dressing changes consisting of collagenase, Telfa, 4 x 4's, and leg wrap.  Patient presents today with her daughter. They report her leg wound is healing verywell. Reports itchy rash on posterior of leg. Denies fever, chills, nausea, and vomiting.   Right lower leg wound has fully closed. No signs of infection, redness, or drainage. A purplish area running the length of previous wound is present; some refill observed after occlusion. Will continue to monitor. Itching from very dry flaky skin on posterior of leg.   Recommend daily dressing changes of Vaseline, Telfa, and Mepilex Boarder dressing to right lower leg wound. (May alternately use vaseline, telfa, and ACE wrap). Discontinue Kerlix as it's contributing to the dry irritated skin of the leg. Daily moisture with vaseline to remained of right lower leg for dry skin.  Follow up in 3-4 weeks. Return precautions given. Call office with any questions/concerns.   Pictures were obtained of the patient and placed in the chart with the patient's or guardian's permission.

## 2020-09-05 ENCOUNTER — Telehealth: Payer: Self-pay

## 2020-09-05 ENCOUNTER — Encounter: Payer: Self-pay | Admitting: Plastic Surgery

## 2020-09-05 ENCOUNTER — Other Ambulatory Visit: Payer: Self-pay

## 2020-09-05 ENCOUNTER — Ambulatory Visit (INDEPENDENT_AMBULATORY_CARE_PROVIDER_SITE_OTHER): Payer: Medicare Other | Admitting: Plastic Surgery

## 2020-09-05 VITALS — BP 120/72 | HR 72 | Temp 96.6°F | Ht 66.0 in | Wt 144.0 lb

## 2020-09-05 DIAGNOSIS — S81801D Unspecified open wound, right lower leg, subsequent encounter: Secondary | ICD-10-CM | POA: Diagnosis not present

## 2020-09-05 NOTE — Telephone Encounter (Signed)
Wound/dressing care instructions per [place], [place] sent to: [facility] [phone] /fax# [phone] Wound care as follows: 1) vaseline 2) telfa/non-stick 3) mepilex boarder dressing Or - 1) vaseline 2) telfa 3) ABD pad 4) ace wrap vaseline is to be used on the surrounding skin  Dressing changed daily Copy given to pt Copy scanned into chart Copy faxed to home/health

## 2020-09-07 DIAGNOSIS — I10 Essential (primary) hypertension: Secondary | ICD-10-CM | POA: Diagnosis not present

## 2020-09-07 DIAGNOSIS — E538 Deficiency of other specified B group vitamins: Secondary | ICD-10-CM | POA: Diagnosis not present

## 2020-09-07 DIAGNOSIS — S81801D Unspecified open wound, right lower leg, subsequent encounter: Secondary | ICD-10-CM | POA: Diagnosis not present

## 2020-09-07 DIAGNOSIS — G629 Polyneuropathy, unspecified: Secondary | ICD-10-CM | POA: Diagnosis not present

## 2020-09-07 DIAGNOSIS — F0280 Dementia in other diseases classified elsewhere without behavioral disturbance: Secondary | ICD-10-CM | POA: Diagnosis not present

## 2020-09-07 DIAGNOSIS — E785 Hyperlipidemia, unspecified: Secondary | ICD-10-CM | POA: Diagnosis not present

## 2020-09-13 ENCOUNTER — Ambulatory Visit: Payer: Medicare Other

## 2020-09-14 ENCOUNTER — Other Ambulatory Visit: Payer: Self-pay

## 2020-09-14 ENCOUNTER — Telehealth: Payer: Self-pay | Admitting: Internal Medicine

## 2020-09-14 DIAGNOSIS — F0280 Dementia in other diseases classified elsewhere without behavioral disturbance: Secondary | ICD-10-CM | POA: Diagnosis not present

## 2020-09-14 DIAGNOSIS — E538 Deficiency of other specified B group vitamins: Secondary | ICD-10-CM | POA: Diagnosis not present

## 2020-09-14 DIAGNOSIS — E785 Hyperlipidemia, unspecified: Secondary | ICD-10-CM | POA: Diagnosis not present

## 2020-09-14 DIAGNOSIS — G629 Polyneuropathy, unspecified: Secondary | ICD-10-CM | POA: Diagnosis not present

## 2020-09-14 DIAGNOSIS — S81801D Unspecified open wound, right lower leg, subsequent encounter: Secondary | ICD-10-CM | POA: Diagnosis not present

## 2020-09-14 DIAGNOSIS — I10 Essential (primary) hypertension: Secondary | ICD-10-CM | POA: Diagnosis not present

## 2020-09-14 NOTE — Telephone Encounter (Signed)
Sharyn Lull w/ [facility] is requesting verbals for wound care 1w4.     Okay to LVM: [phone].

## 2020-09-14 NOTE — Telephone Encounter (Signed)
Verbal orders given  

## 2020-09-15 ENCOUNTER — Other Ambulatory Visit: Payer: Self-pay

## 2020-09-15 ENCOUNTER — Ambulatory Visit (INDEPENDENT_AMBULATORY_CARE_PROVIDER_SITE_OTHER): Payer: Medicare Other | Admitting: *Deleted

## 2020-09-15 DIAGNOSIS — G63 Polyneuropathy in diseases classified elsewhere: Secondary | ICD-10-CM

## 2020-09-15 DIAGNOSIS — E538 Deficiency of other specified B group vitamins: Secondary | ICD-10-CM

## 2020-09-15 MED ORDER — CYANOCOBALAMIN 1000 MCG/ML IJ SOLN
1000.0000 ug | Freq: Once | INTRAMUSCULAR | Status: AC
  Administered 2020-09-15: 1000 ug via INTRAMUSCULAR

## 2020-09-15 NOTE — Progress Notes (Signed)
Pls cosign for B12 inj../lmb  

## 2020-09-18 DIAGNOSIS — Z7902 Long term (current) use of antithrombotics/antiplatelets: Secondary | ICD-10-CM | POA: Diagnosis not present

## 2020-09-18 DIAGNOSIS — E538 Deficiency of other specified B group vitamins: Secondary | ICD-10-CM | POA: Diagnosis not present

## 2020-09-18 DIAGNOSIS — Z8673 Personal history of transient ischemic attack (TIA), and cerebral infarction without residual deficits: Secondary | ICD-10-CM | POA: Diagnosis not present

## 2020-09-18 DIAGNOSIS — Z9181 History of falling: Secondary | ICD-10-CM | POA: Diagnosis not present

## 2020-09-18 DIAGNOSIS — R32 Unspecified urinary incontinence: Secondary | ICD-10-CM | POA: Diagnosis not present

## 2020-09-18 DIAGNOSIS — S81801D Unspecified open wound, right lower leg, subsequent encounter: Secondary | ICD-10-CM | POA: Diagnosis not present

## 2020-09-18 DIAGNOSIS — N3281 Overactive bladder: Secondary | ICD-10-CM | POA: Diagnosis not present

## 2020-09-18 DIAGNOSIS — Z90710 Acquired absence of both cervix and uterus: Secondary | ICD-10-CM | POA: Diagnosis not present

## 2020-09-18 DIAGNOSIS — E785 Hyperlipidemia, unspecified: Secondary | ICD-10-CM | POA: Diagnosis not present

## 2020-09-18 DIAGNOSIS — F0280 Dementia in other diseases classified elsewhere without behavioral disturbance: Secondary | ICD-10-CM | POA: Diagnosis not present

## 2020-09-18 DIAGNOSIS — Z9089 Acquired absence of other organs: Secondary | ICD-10-CM | POA: Diagnosis not present

## 2020-09-18 DIAGNOSIS — G63 Polyneuropathy in diseases classified elsewhere: Secondary | ICD-10-CM | POA: Diagnosis not present

## 2020-09-18 DIAGNOSIS — W19XXXD Unspecified fall, subsequent encounter: Secondary | ICD-10-CM | POA: Diagnosis not present

## 2020-09-18 DIAGNOSIS — R296 Repeated falls: Secondary | ICD-10-CM | POA: Diagnosis not present

## 2020-09-18 DIAGNOSIS — I10 Essential (primary) hypertension: Secondary | ICD-10-CM | POA: Diagnosis not present

## 2020-09-18 DIAGNOSIS — F32A Depression, unspecified: Secondary | ICD-10-CM | POA: Diagnosis not present

## 2020-09-18 DIAGNOSIS — Z87891 Personal history of nicotine dependence: Secondary | ICD-10-CM | POA: Diagnosis not present

## 2020-09-19 DIAGNOSIS — G63 Polyneuropathy in diseases classified elsewhere: Secondary | ICD-10-CM | POA: Diagnosis not present

## 2020-09-19 DIAGNOSIS — I10 Essential (primary) hypertension: Secondary | ICD-10-CM | POA: Diagnosis not present

## 2020-09-19 DIAGNOSIS — F0280 Dementia in other diseases classified elsewhere without behavioral disturbance: Secondary | ICD-10-CM | POA: Diagnosis not present

## 2020-09-19 DIAGNOSIS — E785 Hyperlipidemia, unspecified: Secondary | ICD-10-CM | POA: Diagnosis not present

## 2020-09-19 DIAGNOSIS — E538 Deficiency of other specified B group vitamins: Secondary | ICD-10-CM | POA: Diagnosis not present

## 2020-09-19 DIAGNOSIS — S81801D Unspecified open wound, right lower leg, subsequent encounter: Secondary | ICD-10-CM | POA: Diagnosis not present

## 2020-09-26 DIAGNOSIS — Z8673 Personal history of transient ischemic attack (TIA), and cerebral infarction without residual deficits: Secondary | ICD-10-CM

## 2020-09-26 DIAGNOSIS — S81801D Unspecified open wound, right lower leg, subsequent encounter: Secondary | ICD-10-CM

## 2020-09-26 DIAGNOSIS — Z7902 Long term (current) use of antithrombotics/antiplatelets: Secondary | ICD-10-CM

## 2020-09-26 DIAGNOSIS — F32A Depression, unspecified: Secondary | ICD-10-CM

## 2020-09-26 DIAGNOSIS — I10 Essential (primary) hypertension: Secondary | ICD-10-CM

## 2020-09-26 DIAGNOSIS — R32 Unspecified urinary incontinence: Secondary | ICD-10-CM

## 2020-09-26 DIAGNOSIS — E538 Deficiency of other specified B group vitamins: Secondary | ICD-10-CM

## 2020-09-26 DIAGNOSIS — Z87891 Personal history of nicotine dependence: Secondary | ICD-10-CM

## 2020-09-26 DIAGNOSIS — W19XXXD Unspecified fall, subsequent encounter: Secondary | ICD-10-CM

## 2020-09-26 DIAGNOSIS — N3281 Overactive bladder: Secondary | ICD-10-CM | POA: Diagnosis not present

## 2020-09-26 DIAGNOSIS — G63 Polyneuropathy in diseases classified elsewhere: Secondary | ICD-10-CM

## 2020-09-26 DIAGNOSIS — F0280 Dementia in other diseases classified elsewhere without behavioral disturbance: Secondary | ICD-10-CM

## 2020-09-26 DIAGNOSIS — Z90710 Acquired absence of both cervix and uterus: Secondary | ICD-10-CM

## 2020-09-26 DIAGNOSIS — Z9181 History of falling: Secondary | ICD-10-CM

## 2020-09-26 DIAGNOSIS — E785 Hyperlipidemia, unspecified: Secondary | ICD-10-CM

## 2020-09-26 DIAGNOSIS — R296 Repeated falls: Secondary | ICD-10-CM | POA: Diagnosis not present

## 2020-09-26 DIAGNOSIS — Z9089 Acquired absence of other organs: Secondary | ICD-10-CM

## 2020-10-02 DIAGNOSIS — E785 Hyperlipidemia, unspecified: Secondary | ICD-10-CM | POA: Diagnosis not present

## 2020-10-02 DIAGNOSIS — S81801D Unspecified open wound, right lower leg, subsequent encounter: Secondary | ICD-10-CM | POA: Diagnosis not present

## 2020-10-02 DIAGNOSIS — F0280 Dementia in other diseases classified elsewhere without behavioral disturbance: Secondary | ICD-10-CM | POA: Diagnosis not present

## 2020-10-02 DIAGNOSIS — E538 Deficiency of other specified B group vitamins: Secondary | ICD-10-CM | POA: Diagnosis not present

## 2020-10-02 DIAGNOSIS — I10 Essential (primary) hypertension: Secondary | ICD-10-CM | POA: Diagnosis not present

## 2020-10-02 DIAGNOSIS — G63 Polyneuropathy in diseases classified elsewhere: Secondary | ICD-10-CM | POA: Diagnosis not present

## 2020-10-05 ENCOUNTER — Ambulatory Visit (INDEPENDENT_AMBULATORY_CARE_PROVIDER_SITE_OTHER): Payer: Medicare Other | Admitting: Surgical

## 2020-10-05 ENCOUNTER — Other Ambulatory Visit: Payer: Self-pay

## 2020-10-05 ENCOUNTER — Encounter: Payer: Self-pay | Admitting: Surgical

## 2020-10-05 VITALS — BP 118/84 | HR 80

## 2020-10-05 DIAGNOSIS — S81801D Unspecified open wound, right lower leg, subsequent encounter: Secondary | ICD-10-CM

## 2020-10-05 NOTE — Progress Notes (Signed)
° °  Referring Provider Janith Lima, MD [place],  [place] [address]   CC:  Chief Complaint  Patient presents with   Follow-up      Jocelyn [name] is an 87 y.o. female.  HPI: Patient is an 87 year old female here for follow-up on her right lower extremity wound.  She had a skin tear after falling from a truck running board on 06/22/2020.  She was last seen in the office 1 month ago.  At this time it was well-healed, but she had some discoloration that they were concerned about.  No wounds of the distal right lower extremity noted. She reports she has been applying Vaseline to the area to help with moisturization.  She is very pleased with how things have been healing.  Review of Systems Skin: Negative pain, no wound, no drainage  Physical Exam Vitals with BMI 10/05/2020 09/05/2020 08/08/2020  Height - 5\' 6"  5\' 6"   Weight - 144 lbs 144 lbs  BMI - 48.54 62.70  Systolic 350 093 818  Diastolic 84 72 76  Pulse 80 72 86    General:  No acute distress,  Alert and oriented, Non-Toxic, Normal speech and affect Right lower extremity: Right lower extremity wound is well-healed, no surrounding erythema or cellulitic changes noted.  No swelling is noted.  No pedal edema noted.  She has good perfusion of her distal extremity.  No drainage is noted.  No tenderness noted with palpation.  Assessment/Plan  No further wound care is necessary, if patient would like she can continue to cover the area with a moisturizing lotion, Aquaphor or Vaseline. There is no sign of infection.  The wound is well-healed.  I recommend she follow-up on an as-needed basis.  Patient is in agreement with this.  Jocelyn [name] Reyann Troop 10/05/2020, 11:22 AM

## 2020-10-09 DIAGNOSIS — S81801D Unspecified open wound, right lower leg, subsequent encounter: Secondary | ICD-10-CM | POA: Diagnosis not present

## 2020-10-09 DIAGNOSIS — I10 Essential (primary) hypertension: Secondary | ICD-10-CM | POA: Diagnosis not present

## 2020-10-09 DIAGNOSIS — F0280 Dementia in other diseases classified elsewhere without behavioral disturbance: Secondary | ICD-10-CM | POA: Diagnosis not present

## 2020-10-09 DIAGNOSIS — E785 Hyperlipidemia, unspecified: Secondary | ICD-10-CM | POA: Diagnosis not present

## 2020-10-09 DIAGNOSIS — E538 Deficiency of other specified B group vitamins: Secondary | ICD-10-CM | POA: Diagnosis not present

## 2020-10-09 DIAGNOSIS — G63 Polyneuropathy in diseases classified elsewhere: Secondary | ICD-10-CM | POA: Diagnosis not present

## 2020-10-11 ENCOUNTER — Encounter: Payer: Self-pay | Admitting: Adult Health

## 2020-10-12 ENCOUNTER — Other Ambulatory Visit: Payer: Self-pay | Admitting: Internal Medicine

## 2020-10-12 DIAGNOSIS — I63531 Cerebral infarction due to unspecified occlusion or stenosis of right posterior cerebral artery: Secondary | ICD-10-CM

## 2020-10-13 ENCOUNTER — Other Ambulatory Visit: Payer: Self-pay | Admitting: Internal Medicine

## 2020-10-13 DIAGNOSIS — I63531 Cerebral infarction due to unspecified occlusion or stenosis of right posterior cerebral artery: Secondary | ICD-10-CM

## 2020-10-17 ENCOUNTER — Ambulatory Visit (INDEPENDENT_AMBULATORY_CARE_PROVIDER_SITE_OTHER): Payer: Medicare Other | Admitting: Adult Health

## 2020-10-17 ENCOUNTER — Encounter: Payer: Self-pay | Admitting: Adult Health

## 2020-10-17 VITALS — BP 134/85 | HR 95 | Ht 66.0 in | Wt 139.0 lb

## 2020-10-17 DIAGNOSIS — I69319 Unspecified symptoms and signs involving cognitive functions following cerebral infarction: Secondary | ICD-10-CM | POA: Diagnosis not present

## 2020-10-17 DIAGNOSIS — I63531 Cerebral infarction due to unspecified occlusion or stenosis of right posterior cerebral artery: Secondary | ICD-10-CM | POA: Diagnosis not present

## 2020-10-17 NOTE — Progress Notes (Signed)
[facility] [address] [place]. [place] [address] [phone]       STROKE FOLLOW UP NOTE  [name] Date of Birth:  14-Apr-1932 Medical Record Number:  [id number]   Reason for Referral: stroke follow up    SUBJECTIVE:   CHIEF COMPLAINT:  Chief Complaint  Patient presents with  . Follow-up    Tr with daughter (Jocelyn [name]) Pt is having some dizziness, gets people confused and having some imbalance.     HPI:   Today, 10/17/2020, Jocelyn [name] returns for 71-month scheduled follow-up with prior stroke history and cognitive decline.  She is accompanied by her daughter, Jocelyn [name]  Residual stroke deficits of cognitive impairment and imbalance Denies new stroke/TIA symptoms  At prior visit, concern for worsening gait with frequent falls and cognitive decline - MRI w/wo negative for acute abnormalities, EEG negative, dementia panel within normal limits except slightly low B12 level (although still WNL) with known B12 deficiency She has not had any additional falls and continues to use rolling walker for assistance -previously using cane  She continues to live independently and maintains ADLs independently Daughter assists with majority of IADL's Does not currently drive which she is frustrated about Per daughter, she has noted a continued decline in cognition specifically with short-term memory She remains sedentary watching TV majority of the day -patient reports activity and exercise during the day but per daughter, this is not accurate Per daughter, occasional agitation and increased confusion such as believing her husband is with her in her home - she denies actually seeing him but "feels his presence"  On Namenda 10mg  twice daily tolerating without side effects On Cymbalta per PCP for depression/anxiety with noted improvement  MMSE today 23/30 (prior 23/30)  Reports compliance on Plavix and atorvastatin -denies side effects Blood pressure today 134/85  No  further concerns at this time     History provided for reference purposes only Update 06/19/2020 JM: Jocelyn [name] returns for stroke follow-up accompanied by her granddaughter.  Granddaughter sent my chart message on 06/18/2020 with concerns of frequent falls and gradual worsening cognition over the past 3 months (see MyChart message 11/7). Per granddaughter, she has had multiple falls with 2 falls over the past month with fall on 10/12 apparently in the middle of the night but did not call her granddaughter until later the following day. Per granddaughter, multiple lacerations including back of her head on the left side and 3 areas in her left arm.  She refused to be evaluated at that time.  She had an additional fall recently thankfully without injury.  She is currently living independently but does have family checking on her frequently and cameras throughout the house but granddaughter has appropriate safety concerns as she is unaware of her physical and cognitive deficits.  Granddaughter attempted to place her in ALF with patient adamantly refused.  She will be agitated or irritable quickly, frequently ask where her husband is (who passed away from Alzheimer's disease) and become more confused in the evening or initially upon awakening in the morning.  Reports sleeping well throughout the night.  She does admit to depression/anxiety with PCP previously starting her on duloxetine 30 mg daily but denies any benefit.  Admits to sedentary lifestyle with majority of her days spent watching TV.  Previously working with therapies but these were discontinued due to lack of participation and improvement.  Denies focal deficits such as speech difficulty, hemiparesis, visual changes or sensory changes.  She has remained on  aspirin and Plavix despite 104-month recommendation without side effects.  Remains on atorvastatin 40 mg daily without myalgias.  Blood pressure today 147/90.  No further concerns at this  time.  Initial visit 03/01/2020 JM: Jocelyn [name] is being seen for hospital follow-up accompanied by her granddaughter Jocelyn [name].  Residual deficits of mild imbalance and mild cognitive impairment which has been improving.  Currently working with home health therapy which is to be completed after additional 2 sessions.  She was evaluated by OT or SLP.  Previously living independently but currently staying with family due to safety concerns in regards to ambulation, performing ADLs and IADLs and cognitive impairment.  Currently only on Plavix and discontinued aspirin after 3 weeks (per granddaughter, this was advised at hospital discharge).  Denies bleeding or bruising currently or while on DAPT.  Continues on atorvastatin without myalgias.  Blood pressure today 130/70.  No other concerns at this time.  Stroke admission 01/31/2020 Personally reviewed all hospital notes and pertinent lab work and imaging Jocelyn [name] is a 87 y.o. female with history of HTN, HLD  who presented on 01/31/2020 with waxing and waning confusion, left-sided visual loss and R side numbness.  Stroke work-up revealed right PCA infarct in setting of right PCA high-grade stenosis, infarct secondary to large vessel disease source.  CTA head/neck diffuse intracranial stenosis (additional information below).  Recommended DAPT for 3 months then Plavix alone as previously on aspirin.  HTN stable.  LDL 123 initiate atorvastatin 40 mg daily.  Other stroke risk factors include advanced age, former tobacco use, EtOH use, family history of stroke but no personal history of stroke.  Evaluated by therapy and recommended outpatient PT and discharged home in stable condition.  Stroke:   R PCA infarct in setting of R PCA high-grade stenosis, infarcts secondary to large vessel disease source  MRI  Acute and subacute R occipital infarcts. Moderate small vessel disease.   CTA head & neck proximal R P2 high-grade stenosis associated w/ infarct. R P3 superior  division occlusion. Moderate L P2 stenosis. L A1 high-grade stenosis. Moderate mid R frontoparietal branch stenosis RM3. B narrowing ACA and MCA branches. Supraclinoid R ICA 50%. B ICA bifurcation atherosclerosis. 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 No source of embolus   LDL 123  HgbA1c pending   aspirin 81 mg daily prior to admission, now on aspirin 81 mg daily and clopidogrel 75 mg daily. Continue DAPT x 3 months then plavix alone  Therapy recommendations:  OP PT  Disposition:  D/c home      ROS:   14 system review of systems performed and negative with exception of those listed in HPI  PMH:  Past Medical History:  Diagnosis Date  . HTN (hypertension)   . Hyperlipidemia   . Urinary incontinence     PSH:  Past Surgical History:  Procedure Laterality Date  . ABDOMINAL HYSTERECTOMY    . TONSILLECTOMY      Social History:  Social History   Socioeconomic History  . Marital status: Widowed    Spouse name: Not on file  . Number of children: 1  . Years of education: Not on file  . Highest education level: Not on file  Occupational History  . Occupation: Retired  Tobacco Use  . Smoking status: Former Research scientist (life sciences)  . Smokeless tobacco: Former Systems developer    Quit date: 03/17/2017  . Tobacco comment: STATES SHE QUIT 25 YEARS AGO  Vaping Use  . Vaping Use: Never used  Substance and Sexual Activity  . Alcohol use: Yes    Alcohol/week: 14.0 standard drinks    Types: 7 Glasses of wine, 7 Cans of beer per week  . Drug use: No  . Sexual activity: Never    Birth control/protection: Surgical  Other Topics Concern  . Not on file  Social History Narrative   Regular Exercise-no         Social Determinants of Health   Financial Resource Strain: Low Risk   . Difficulty of Paying Living Expenses: Not hard at all  Food Insecurity: No Food Insecurity  . Worried About Charity fundraiser in the Last Year: Never true  .  Ran Out of Food in the Last Year: Never true  Transportation Needs: No Transportation Needs  . Lack of Transportation (Medical): No  . Lack of Transportation (Non-Medical): No  Physical Activity: Not on file  Stress: No Stress Concern Present  . Feeling of Stress : Not at all  Social Connections: Unknown  . Frequency of Communication with Friends and Family: More than three times a week  . Frequency of Social Gatherings with Friends and Family: More than three times a week  . Attends Religious Services: Not on file  . Active Member of Clubs or Organizations: Not on file  . Attends Archivist Meetings: Not on file  . Marital Status: Widowed  Intimate Partner Violence: Not on file    Family History:  Family History  Problem Relation Age of Onset  . Diabetes Other        1st degree relative  . Hyperlipidemia Other   . Stroke Other        1st degree Female relative <50  . Cancer Neg Hx   . Early death Neg Hx   . Heart disease Neg Hx   . Hypertension Neg Hx   . Kidney disease Neg Hx     Medications:   Current Outpatient Medications on File Prior to Visit  Medication Sig Dispense Refill  . atorvastatin (LIPITOR) 40 MG tablet Take 1 tablet (40 mg total) by mouth daily. 90 tablet 1  . clopidogrel (PLAVIX) 75 MG tablet Take 1 tablet (75 mg total) by mouth daily. Follow-up appt is due must see provider for future refills 30 tablet 0  . collagenase (SANTYL) ointment Apply 1 application topically daily. 15 g 0  . Cyanocobalamin (B-12 COMPLIANCE INJECTION IJ) Inject 1 Dose as directed every 30 (thirty) days.    . DULoxetine (CYMBALTA) 60 MG capsule Take 1 capsule (60 mg total) by mouth daily. 90 capsule 1  . memantine (NAMENDA) 10 MG tablet Take 1 tablet (10 mg total) by mouth 2 (two) times daily. 180 tablet 3  . solifenacin (VESICARE) 10 MG tablet Take 1 tablet (10 mg total) by mouth daily. 90 tablet 1   No current facility-administered medications on file prior to visit.     Allergies:   Allergies  Allergen Reactions  . Codeine Itching and Nausea And Vomiting  . Tape Rash    Tears the skin      OBJECTIVE:  Physical Exam  Vitals:   10/17/20 1004  BP: 134/85  Pulse: 95  Weight: 139 lb (63 kg)  Height: 5\' 6"  (1.676 m)   Body mass index is 22.44 kg/m. No exam data present  General: well developed, well nourished, pleasant elderly Caucasian female, seated, in no evident distress Head: head normocephalic and atraumatic.  Neck: supple with no carotid or supraclavicular bruits Cardiovascular: regular rate and rhythm, no murmurs Musculoskeletal: no deformity Skin:  no rash/petichiae Vascular:  Normal pulses all extremities   Neurologic Exam Mental Status: Awake and fully alert.  Fluent speech and language.  Oriented to place and time. Recent memory impaired and remote memory intact. Attention span, concentration and fund of knowledge impaired. Mood and affect appropriate and cooperative with history taking and exam.  MMSE - Mini Mental State Exam 10/17/2020 06/19/2020 12/08/2017  Not completed: - - -  Orientation to time 3 4 5   Orientation to Place 5 5 5   Registration 3 3 3   Attention/ Calculation 2 1 5   Recall 1 2 3   Language- name 2 objects 2 2 2   Language- repeat 1 1 1   Language- follow 3 step command 3 3 3   Language- read & follow direction 1 1 1   Write a sentence 1 1 1   Copy design 1 1 1   Copy design-comments - 6 animals -  Total score 23 24 30    Cranial Nerves: Pupils equal, briskly reactive to light. Extraocular movements full without nystagmus. Visual fields full to confrontation. Hearing intact. Facial sensation intact. Face, tongue, palate moves normally and symmetrically.  Motor: Normal bulk and tone. Normal strength in all tested extremity muscles except mild b/l hip flexor weakness Sensory.: intact to touch , pinprick , position and vibratory sensation.  Coordination: Rapid alternating movements normal in all extremities  except slightly decreased left hand. Finger-to-nose and heel-to-shin performed accurately bilaterally. Gait and Station: Arises from chair without difficulty. Stance is normal. Gait demonstrates normal stride length and minimal imbalance with use of rolling walker Reflexes: 1+ and symmetric. Toes downgoing.       ASSESSMENT/PLAN: Jocelyn [name] is a 87 y.o. year old female presented with waxing/waning confusion, right-sided numbness and left-sided visual impairment on 01/31/2020 with stroke work-up revealing R PCA infarct in setting of R PCA high-grade stenosis, infarcts secondary to large vessel disease source. Vascular risk factors include HTN, HLD, diffuse intracranial stenosis, former tobacco use, advanced age and EtOH use.      1. Cognitive impairment: a. MMSE 23/30 - stable b. Subjectively, gradual decline of short-term memory c. Continue Namenda 10 mg twice daily -refill provided d. Consider initiating Aricept 10 mg nightly for possible further benefit e. Again discussed importance of physical activity and exercise as there is increased possibility of continued gradual decline with continued sedentary lifestyle f. EEG 06/26/2020 normal g. MR brain w/wo 06/23/2020 no new or acute abnormalities h. Dementia panel WNL (mild low B12 -receiving supplementation thru PCP) 2. R PCA stroke:  a. Residual deficits: Cognitive impairment and imbalance.  Discussed importance of use of rolling walker at all times for fall prevention b. Continue clopidogrel 75 mg daily and atorvastatin 40 mg daily for secondary stroke prevention.  c. Discussed secondary stroke prevention measures and importance of close PCP follow-up for aggressive stroke risk factor management. d. HTN: BP goal<130/90.  Stable currently diet controlled per PCP e. HLD: LDL goal<70. On atorvastatin 40 mg daily per PCP     Follow up in 6 months or call earlier if needed   CC:  [facility] provider: Dr. Lenda Kelp, Arvid Right, MD    I  spent 35 minutes of face-to-face and non-face-to-face time with patient and daughter.  This included previsit chart review, lab review, study review, order entry, electronic health record documentation, patient education regarding gradual cognitive decline and imbalance, completion and review of MMSE, history  of stroke, importance of managing stroke risk factors and answered all other questions to patient and daughters satisfaction   Frann Rider, [facility]  [facility] [address] [place], [place] [address]  Phone [phone] Fax [phone] Note: This document was prepared with digital dictation and possible smart phrase technology. Any transcriptional errors that result from this process are unintentional.

## 2020-10-17 NOTE — Patient Instructions (Signed)
Continue namenda 10mg  twice daily   Consider use of aricept 10mg  nightly which may further help with cognition - please call office if interested in starting  Continue clopidogrel 75 mg daily  and atorvastatin  for secondary stroke prevention  Continue to follow up with PCP regarding cholesterol and blood pressure management  Maintain strict control of hypertension with blood pressure goal below 130/90 and cholesterol with LDL cholesterol (bad cholesterol) goal below 70 mg/dL.       Followup in the future with me in 6 months or call earlier if needed       Thank you for coming to see [place] at [facility]. I hope we have been able to provide you high quality care today.  You may receive a patient satisfaction survey over the next few weeks. We would appreciate your feedback and comments so that we may continue to improve ourselves and the health of our patients.     Vascular Dementia Dementia is a condition in which a person has problems with thinking, memory, and behavior that are severe enough to interfere with daily life. Vascular dementia is a type of dementia. It results from brain damage that is caused by the brain not getting enough blood. This condition may also be called vascular cognitive impairment. What are the causes? Vascular dementia is caused by conditions that reduce blood flow to the brain. Common causes of this condition include:  Multiple small strokes. These may happen without symptoms (silent stroke).  Major stroke.  Damage to small blood vessels in the brain (cerebral small vessel disease). What increases the risk? The following factors may make you more likely to develop this condition:  Having had a stroke.  Having high blood pressure (hypertension) or high cholesterol.  Having a disease that affects the heart or blood vessels.  Smoking.  Not being active.  Being over age 101.  Having any of these  conditions: ? Diabetes. ? Metabolic syndrome. ? Obesity. ? Depression. ? A genetic condition that leads to stroke, such as CADASIL (cerebral autosomal dominant arteriopathy with subcortical infarcts and leukoencephalopathy). What are the signs or symptoms? Symptoms of vascular dementia can vary from one person to another. Symptoms may be mild or severe depending on the amount of damage and which parts of the brain have been affected. Symptoms may begin suddenly or may develop slowly. Mental symptoms may include:  Confusion and memory problems.  Poor attention and concentration.  Trouble understanding speech.  Depression.  Personality changes.  Trouble recognizing familiar people.  Agitation or aggression.  Paranoia or false beliefs (delusions).  Hallucinations. These involve seeing, hearing, tasting, smelling, or feeling things that are not real. Physical symptoms may include:  Weakness.  Poor balance.  Loss of bladder or bowel control (incontinence).  Unsteady walking (gait).  Speaking problems. Behavioral symptoms may include:  Getting lost in familiar places.  Problems with planning and judgment.  Trouble following instructions.  Social problems.  Emotional outbursts.  Trouble with daily activities and self-care.  Problems handling money. Symptoms may remain stable, or they may get worse over time. Symptoms of vascular dementia may be similar to those of Alzheimer's disease. The two conditions can occur together (mixed dementia). How is this diagnosed? This condition may be diagnosed based on:  Your medical history and a physical exam.  Symptoms or changes that are reported by friends and family.  Lab tests or other tests that check brain and nervous system function. Tests may include: ? Blood tests. ?  Brain imaging tests. ? Tests of movement, speech, and other daily activities (neurological exam). ? Tests of memory, thinking, and problem-solving  (neuropsychological or neurocognitive testing). There is not a specific test to diagnose vascular dementia. Diagnosis may involve several specialists. These may include:  A health care provider who specializes in the brain and nervous system (neurologist).  A health care provider who specializes in understanding how problems in the brain can alter behavior and cognitive function (neuropsychologist). How is this treated? There is no cure for vascular dementia. Brain damage that has already occurred cannot be reversed. Treatment depends on:  How severe the condition is.  Which parts of your brain have been affected.  Your overall health. Treatment measures aim to:  Treat the underlying cause of vascular dementia and manage risk factors. This may include: ? Controlling blood pressure or lowering cholesterol. ? Treating diabetes. ? Making lifestyle changes, such as quitting smoking or losing weight.  Manage symptoms.  Prevent further brain damage.  Improve the person's health and quality of life. Treatment for dementia may involve a team of health care providers, including:  A neurologist.  A provider who specializes in disorders of the mind (psychiatrist).  A provider who specializes in helping people learn daily living skills (occupational therapist).  A provider who focuses on speech and language changes (Electrical engineer).  A heart specialist (cardiologist).  A provider who helps people learn how to manage physical changes, such as movement and walking (exercise physiologist or physical therapist). Follow these instructions at home: Medicines  Take over-the-counter and prescription medicines only as told by your health care provider.  Use a pill organizer or pill reminder to help you manage your medicines. Lifestyle  Do not use any products that contain nicotine or tobacco, such as cigarettes, e-cigarettes, and chewing tobacco. If you need help quitting, ask your  health care provider.  Eat a healthy, balanced diet.  Maintain a healthy weight, or lose weight if needed.  Be physically active as told by your health care provider. General instructions  Work with your health care provider to determine what you need help with and what your safety needs are.  Follow the health care provider's instructions for treating the condition that caused the dementia.  Keep all follow-up visits. This is important. If you are the caregiver: People with vascular dementia may need regular help at home or daily care from a family member or home health care worker. Home care for a person with vascular dementia depends on what caused the condition and how severe the symptoms are. General guidelines for caregivers include:  Help the person with dementia remember people, appointments, and daily activities.  Help the person with dementia manage his or her medicines.  Help family and friends learn about ways to communicate with the person with dementia.  Create a safe living space to reduce the risk of injury or falls.  Find a support group to help caregivers and family cope with the effects of dementia.   Where to find more information  [employer] of Neurological Disorders and Stroke: MasterBoxes.it Contact a health care provider if:  New behavioral problems develop.  Problems with swallowing develop.  Confusion gets worse.  Sleepiness gets worse. Get help right away if:  Loss of consciousness occurs.  There is a sudden loss of speech, balance, or thinking ability.  New numbness or paralysis occurs.  Sudden, severe headache occurs.  Vision is lost or suddenly gets worse in one or both eyes. If  you ever feel like the person with dementia may hurt himself or herself or others, or if he or she shares thoughts about taking his or her own life, get help right away. You can go to your nearest emergency department or:  Call your local emergency  services (911 in the [place]  Call a suicide crisis helpline, such as the [place] at 480-406-9991. This is open 24 hours a day in the [place]  Text the Crisis Text Line at 803-599-6703 (in the [place].). Summary  Vascular dementia is a type of dementia. It results from brain damage that is caused by the brain not getting enough blood.  Vascular dementia is caused by conditions that reduce blood flow to the brain. Common causes of this condition include stroke and damage to small blood vessels in the brain.  Treatment focuses on treating the underlying cause of vascular dementia and managing any risk factors.  People with vascular dementia may need regular help at home or daily care from a family member or home health care worker.  Contact a health care provider if you or your caregiver notices any new symptoms. This information is not intended to replace advice given to you by your health care provider. Make sure you discuss any questions you have with your health care provider. Document Revised: 12/13/2019 Document Reviewed: 12/13/2019 Elsevier Patient Education  2021 [place].     Donepezil tablets What is this medicine? DONEPEZIL (doe NEP e zil) is used to treat mild to moderate dementia caused by Alzheimer's disease. This medicine may be used for other purposes; ask your health care provider or pharmacist if you have questions. COMMON BRAND NAME(S): Aricept What should I tell my health care provider before I take this medicine? They need to know if you have any of these conditions:  asthma or other lung disease  difficulty passing urine  head injury  heart disease  history of irregular heartbeat  liver disease  seizures (convulsions)  stomach or intestinal disease, ulcers or stomach bleeding  an unusual or allergic reaction to donepezil, other medicines, foods, dyes, or preservatives  pregnant or trying to get pregnant  breast-feeding How  should I use this medicine? Take this medicine by mouth with a glass of water. Follow the directions on the prescription label. You may take this medicine with or without food. Take this medicine at regular intervals. This medicine is usually taken before bedtime. Do not take it more often than directed. Continue to take your medicine even if you feel better. Do not stop taking except on your doctor's advice. If you are taking the 23 mg donepezil tablet, swallow it whole; do not cut, crush, or chew it. Talk to your pediatrician regarding the use of this medicine in children. Special care may be needed. Overdosage: If you think you have taken too much of this medicine contact a poison control center or emergency room at once. NOTE: This medicine is only for you. Do not share this medicine with others. What if I miss a dose? If you miss a dose, take it as soon as you can. If it is almost time for your next dose, take only that dose, do not take double or extra doses. What may interact with this medicine? Do not take this medicine with any of the following medications:  certain medicines for fungal infections like itraconazole, fluconazole, posaconazole, and voriconazole  cisapride  dextromethorphan; quinidine  dronedarone  pimozide  quinidine  thioridazine This medicine may  also interact with the following medications:  antihistamines for allergy, cough and cold  atropine  bethanechol  carbamazepine  certain medicines for bladder problems like oxybutynin, tolterodine  certain medicines for Parkinson's disease like benztropine, trihexyphenidyl  certain medicines for stomach problems like dicyclomine, hyoscyamine  certain medicines for travel sickness like scopolamine  dexamethasone  dofetilide  ipratropium  NSAIDs, medicines for pain and inflammation, like ibuprofen or naproxen  other medicines for Alzheimer's disease  other medicines that prolong the QT interval  (cause an abnormal heart rhythm)  phenobarbital  phenytoin  rifampin, rifabutin or rifapentine  ziprasidone This list may not describe all possible interactions. Give your health care provider a list of all the medicines, herbs, non-prescription drugs, or dietary supplements you use. Also tell them if you smoke, drink alcohol, or use illegal drugs. Some items may interact with your medicine. What should I watch for while using this medicine? Visit your doctor or health care professional for regular checks on your progress. Check with your doctor or health care professional if your symptoms do not get better or if they get worse. You may get drowsy or dizzy. Do not drive, use machinery, or do anything that needs mental alertness until you know how this drug affects you. What side effects may I notice from receiving this medicine? Side effects that you should report to your doctor or health care professional as soon as possible:  allergic reactions like skin rash, itching or hives, swelling of the face, lips, or tongue  feeling faint or lightheaded, falls  loss of bladder control  seizures  signs and symptoms of a dangerous change in heartbeat or heart rhythm like chest pain; dizziness; fast or irregular heartbeat; palpitations; feeling faint or lightheaded, falls; breathing problems  signs and symptoms of infection like fever or chills; cough; sore throat; pain or trouble passing urine  signs and symptoms of liver injury like dark yellow or brown urine; general ill feeling or flu-like symptoms; light-colored stools; loss of appetite; nausea; right upper belly pain; unusually weak or tired; yellowing of the eyes or skin  slow heartbeat or palpitations  unusual bleeding or bruising  vomiting Side effects that usually do not require medical attention (report to your doctor or health care professional if they continue or are bothersome):  diarrhea, especially when starting  treatment  headache  loss of appetite  muscle cramps  nausea  stomach upset This list may not describe all possible side effects. Call your doctor for medical advice about side effects. You may report side effects to FDA at 1-800-FDA-1088. Where should I keep my medicine? Keep out of reach of children. Store at room temperature between 15 and 30 degrees C (59 and 86 degrees F). Throw away any unused medicine after the expiration date. NOTE: This sheet is a summary. It may not cover all possible information. If you have questions about this medicine, talk to your doctor, pharmacist, or health care provider.  2021 Elsevier/Gold Standard (2018-07-20 10:33:41)

## 2020-10-20 ENCOUNTER — Other Ambulatory Visit: Payer: Self-pay

## 2020-10-20 ENCOUNTER — Ambulatory Visit (INDEPENDENT_AMBULATORY_CARE_PROVIDER_SITE_OTHER): Payer: Medicare Other

## 2020-10-20 DIAGNOSIS — G63 Polyneuropathy in diseases classified elsewhere: Secondary | ICD-10-CM

## 2020-10-20 DIAGNOSIS — E538 Deficiency of other specified B group vitamins: Secondary | ICD-10-CM

## 2020-10-20 MED ORDER — CYANOCOBALAMIN 1000 MCG/ML IJ SOLN
1000.0000 ug | Freq: Once | INTRAMUSCULAR | Status: AC
  Administered 2020-10-20: 1000 ug via INTRAMUSCULAR

## 2020-10-20 NOTE — Progress Notes (Addendum)
Patient here for monthly B12 injection per Dr. Ronnald Ramp. B12 1000 mcg given in right IM and patient tolerated injection well today.  I have reviewed and agree

## 2020-10-29 NOTE — Progress Notes (Signed)
I agree with the above plan 

## 2020-11-06 ENCOUNTER — Telehealth: Payer: Self-pay | Admitting: Adult Health

## 2020-11-06 NOTE — Telephone Encounter (Signed)
Pt's daughter(on DPR) disputing a bill for the charge of service on 06-19-20 RPR plus TSH plus B12 plus HOMOCY. Medicare not willing to pay because it was entered as a Non Medical Necessity. Daughter was told by billing to speak with RN to have this addressed.

## 2020-11-06 NOTE — Telephone Encounter (Signed)
Called pts daughter back and relayed that did send in other codes, hopefully should take care of this, if not please dont hesitate to call back.  She appreciated call and will call back as needed.

## 2020-11-06 NOTE — Telephone Encounter (Signed)
Thank you for addressing.  Please ensure that let us know if charges are denied again for further review as this lab work was medically necessary and should be covered by insurance

## 2020-11-06 NOTE — Telephone Encounter (Signed)
Called daughter of pt.  Filed as a non medical necessity , needs new billing code and then refiled.  06-19-21 dementia panel. Spoke to April F.  I added J44.920, I69.319, I10, E78.5 she could not tlel me if this would work they have to refile and then will let us know.

## 2020-11-24 ENCOUNTER — Ambulatory Visit: Payer: Medicare Other

## 2020-12-01 ENCOUNTER — Ambulatory Visit (INDEPENDENT_AMBULATORY_CARE_PROVIDER_SITE_OTHER): Payer: Medicare Other

## 2020-12-01 ENCOUNTER — Other Ambulatory Visit: Payer: Self-pay

## 2020-12-01 DIAGNOSIS — E538 Deficiency of other specified B group vitamins: Secondary | ICD-10-CM

## 2020-12-01 MED ORDER — CYANOCOBALAMIN 1000 MCG/ML IJ SOLN
1000.0000 ug | INTRAMUSCULAR | Status: DC
  Administered 2020-12-01: 1000 ug via INTRAMUSCULAR

## 2020-12-01 NOTE — Progress Notes (Signed)
Pt here for monthly B12 injection per Dr. Jones  B12 1000mcg given right deltoid IM, and pt tolerated injection well.  Next B12 injection scheduled for 01/05/21.  

## 2020-12-25 ENCOUNTER — Telehealth: Payer: Self-pay | Admitting: Internal Medicine

## 2020-12-25 NOTE — Progress Notes (Signed)
  Chronic Care Management   Note  12/25/2020 Name: Jocelyn [name] MRN: [id number] DOB: [birth year]  Jocelyn [name] is a 87 y.o. year old female who is a primary care patient of Janith Lima, MD. I reached out to [name] by phone today in response to a referral sent by Ms. Kimberle Lemmerman's PCP, Janith Lima, MD.   [name] was given information about Chronic Care Management services today including:  1. CCM service includes personalized support from designated clinical staff supervised by her physician, including individualized plan of care and coordination with other care providers 2. 24/7 contact phone numbers for assistance for urgent and routine care needs. 3. Service will only be billed when office clinical staff spend 20 minutes or more in a month to coordinate care. 4. Only one practitioner may furnish and bill the service in a calendar month. 5. The patient may stop CCM services at any time (effective at the end of the month) by phone call to the office staff.   Patient agreed to services and verbal consent obtained.   Follow up plan:   [place]

## 2021-01-05 ENCOUNTER — Ambulatory Visit: Payer: Medicare Other

## 2021-01-10 ENCOUNTER — Other Ambulatory Visit: Payer: Self-pay | Admitting: Internal Medicine

## 2021-01-10 DIAGNOSIS — E785 Hyperlipidemia, unspecified: Secondary | ICD-10-CM

## 2021-01-10 DIAGNOSIS — I63531 Cerebral infarction due to unspecified occlusion or stenosis of right posterior cerebral artery: Secondary | ICD-10-CM

## 2021-01-12 ENCOUNTER — Ambulatory Visit (INDEPENDENT_AMBULATORY_CARE_PROVIDER_SITE_OTHER): Payer: Medicare Other

## 2021-01-12 ENCOUNTER — Other Ambulatory Visit: Payer: Self-pay

## 2021-01-12 DIAGNOSIS — G63 Polyneuropathy in diseases classified elsewhere: Secondary | ICD-10-CM | POA: Diagnosis not present

## 2021-01-12 DIAGNOSIS — E538 Deficiency of other specified B group vitamins: Secondary | ICD-10-CM | POA: Diagnosis not present

## 2021-01-12 MED ORDER — CYANOCOBALAMIN 1000 MCG/ML IJ SOLN
1000.0000 ug | Freq: Once | INTRAMUSCULAR | Status: AC
  Administered 2021-01-12: 1000 ug via INTRAMUSCULAR

## 2021-01-12 NOTE — Progress Notes (Addendum)
Pt. Here for monthly B12 injection per Dr. Ronnald Ramp  B12 1087mcg given in right deltoid IM and patient tolerated injection well.   Next b12 injection scheduled for 02/16/21  I have reviewed and agree

## 2021-01-20 ENCOUNTER — Other Ambulatory Visit: Payer: Self-pay | Admitting: Internal Medicine

## 2021-01-20 DIAGNOSIS — N3281 Overactive bladder: Secondary | ICD-10-CM

## 2021-01-20 DIAGNOSIS — F322 Major depressive disorder, single episode, severe without psychotic features: Secondary | ICD-10-CM

## 2021-01-20 DIAGNOSIS — M159 Polyosteoarthritis, unspecified: Secondary | ICD-10-CM

## 2021-01-25 NOTE — Progress Notes (Signed)
Chronic Care Management Pharmacy Note  01/26/2021 Name:  Jocelyn [name] MRN:  [id number] DOB:  05-Aug-1932  Summary: - Patient doing well, no acute issues or concerns at this time - Patient will need refill of clopidgrel within the next 30 days - reaching out to PCP for refill - Patient scheduled for Vit B12 injection next month, plans to schedule PCP appointment at that time   Recommendations/Changes made from today's visit: No changes at this time   Subjective: Jocelyn [name] is an 87 y.o. year old female who is a primary patient of Janith Lima, MD.  The CCM team was consulted for assistance with disease management and care coordination needs.    Engaged with patient by telephone for initial visit in response to provider referral for pharmacy case management and/or care coordination services.   Consent to Services:  The patient was given the following information about Chronic Care Management services today, agreed to services, and gave verbal consent: 1. CCM service includes personalized support from designated clinical staff supervised by the primary care provider, including individualized plan of care and coordination with other care providers 2. 24/7 contact phone numbers for assistance for urgent and routine care needs. 3. Service will only be billed when office clinical staff spend 20 minutes or more in a month to coordinate care. 4. Only one practitioner may furnish and bill the service in a calendar month. 5.The patient may stop CCM services at any time (effective at the end of the month) by phone call to the office staff. 6. The patient will be responsible for cost sharing (co-pay) of up to 20% of the service fee (after annual deductible is met). Patient agreed to services and consent obtained.  Patient Care Team: Janith Lima, MD as PCP - General Terena Bohan, Darnelle Maffucci, [facility] (Pharmacist) Tomasa Blase, [facility] as Pharmacist (Pharmacist)  Recent office visits: 08/08/2020 - PCP visit  - noted improvement in mood since starting duloxetine, wound is healing appropriately, due for Vit b12 injection   Recent consult visits: 10/17/2020 - Frann Rider NP - Neurology - Post stroke f/u - stroke occurred 01/31/2020 - noted declined in cognition per daughter who had accompanied patient to appointment - no changes in medications at this time - possible to start Aricept in the future  10/05/2020 - Matthew Scheeler PA-C- plastic surgery - patient f/u for wound of right lower extremity (06/2020) - wound has healed, no further treatment needed - f/u on prn basis   Hospital visits: None in previous 6 months  Objective:  Lab Results  Component Value Date   CREATININE 1.01 (H) 06/30/2020   BUN 12 06/30/2020   GFR 54.51 (L) 01/25/2020   GFRNONAA 54 (L) 06/30/2020   GFRAA >60 02/01/2020   NA 136 06/30/2020   K 4.1 06/30/2020   CALCIUM 9.5 06/30/2020   CO2 24 06/30/2020   GLUCOSE 92 06/30/2020    Lab Results  Component Value Date/Time   HGBA1C 4.7 (L) 02/01/2020 02:47 AM   HGBA1C 5.5 06/09/2013 10:45 AM   GFR 54.51 (L) 01/25/2020 12:07 PM   GFR 56.21 (L) 12/31/2017 03:53 PM    Last diabetic Eye exam:  No results found for: HMDIABEYEEXA  Last diabetic Foot exam:  No results found for: HMDIABFOOTEX   Lab Results  Component Value Date   CHOL 198 02/01/2020   HDL 51 02/01/2020   LDLCALC 123 (H) 02/01/2020   LDLDIRECT 128.9 04/08/2011   TRIG 122 02/01/2020   CHOLHDL 3.9 02/01/2020  Hepatic Function Latest Ref Rng & Units 06/30/2020 06/19/2020 01/31/2020  Total Protein 6.5 - 8.1 g/dL 5.6(L) 6.1(L) 5.9(L)  Albumin 3.5 - 5.0 g/dL 3.4(L) 3.8 3.5  AST 15 - 41 U/L 12(L) 11(L) 16  ALT 0 - 44 U/L 8 9 10   Alk Phosphatase 38 - 126 U/L 73 71 52  Total Bilirubin 0.3 - 1.2 mg/dL 0.6 1.0 0.9  Bilirubin, Direct 0.0 - 0.3 mg/dL - - -    Lab Results  Component Value Date/Time   TSH 1.850 06/19/2020 11:09 AM   TSH 1.09 01/25/2020 12:07 PM    CBC Latest Ref Rng & Units 06/30/2020  06/29/2020 06/19/2020  WBC 4.0 - 10.5 K/uL 6.6 8.1 7.7  Hemoglobin 12.0 - 15.0 g/dL 12.7 13.8 13.4  Hematocrit 36.0 - 46.0 % 39.0 41.4 40.8  Platelets 150 - 400 K/uL 299 344 257    No results found for: VD25OH  Clinical ASCVD: Yes  The ASCVD Risk score Mikey Bussing DC Jr., et al., 2013) failed to calculate for the following reasons:   The 2013 ASCVD risk score is only valid for ages 67 to 47   The patient has a prior MI or stroke diagnosis    Depression screen [facility] 2/9 06/26/2020 02/16/2020 01/25/2020  Decreased Interest 1 0 3  Down, Depressed, Hopeless 1 0 3  PHQ - 2 Score 2 0 6  Altered sleeping 0 - 3  Tired, decreased energy 0 - 3  Change in appetite 0 - 2  Feeling bad or failure about yourself  2 - 1  Trouble concentrating 3 - 1  Moving slowly or fidgety/restless 3 - 1  Suicidal thoughts 0 - 0  PHQ-9 Score 10 - 17  Difficult doing work/chores - - Very difficult     Social History   Tobacco Use  Smoking Status Former   Pack years: 0.00  Smokeless Tobacco Former   Quit date: 03/17/2017  Tobacco Comments   STATES SHE QUIT 25 YEARS AGO   BP Readings from Last 3 Encounters:  10/17/20 134/85  10/05/20 118/84  09/05/20 120/72   Pulse Readings from Last 3 Encounters:  10/17/20 95  10/05/20 80  09/05/20 72   Wt Readings from Last 3 Encounters:  10/17/20 139 lb (63 kg)  09/05/20 144 lb (65.3 kg)  08/08/20 144 lb (65.3 kg)   BMI Readings from Last 3 Encounters:  10/17/20 22.44 kg/m  09/05/20 23.24 kg/m  08/08/20 23.24 kg/m    Assessment/Interventions: Review of patient past medical history, allergies, medications, health status, including review of consultants reports, laboratory and other test data, was performed as part of comprehensive evaluation and provision of chronic care management services.   SDOH:  (Social Determinants of Health) assessments and interventions performed: Yes  SDOH Screenings   Alcohol Screen: Not on file  Depression (PHQ2-9): Medium Risk    PHQ-2 Score: 10  Financial Resource Strain: Low Risk    Difficulty of Paying Living Expenses: Not hard at all  Food Insecurity: No Food Insecurity   Worried About Charity fundraiser in the Last Year: Never true   Ran Out of Food in the Last Year: Never true  Housing: Low Risk    Last Housing Risk Score: 0  Physical Activity: Not on file  Social Connections: Unknown   Frequency of Communication with Friends and Family: More than three times a week   Frequency of Social Gatherings with Friends and Family: More than three times a week   Attends Religious Services:  Not on file   Active Member of Clubs or Organizations: Not on file   Attends Club or Organization Meetings: Not on file   Marital Status: Widowed  Stress: No Stress Concern Present   Feeling of Stress : Not at all  Tobacco Use: Medium Risk   Smoking Tobacco Use: Former   Smokeless Tobacco Use: Former  Transport planner Needs: No Data processing manager (Medical): No   Lack of Transportation (Non-Medical): No    CCM Care Plan  Allergies  Allergen Reactions   Codeine Itching and Nausea And Vomiting   Tape Rash    Tears the skin    Medications Reviewed Today     Reviewed by Tomasa Blase, [facility] (Pharmacist) on 01/26/21 at 1502  Med List Status: <None>   Medication Order Taking? Sig Documenting Provider Last Dose Status Informant  atorvastatin (LIPITOR) 40 MG tablet [id number] Yes TAKE ONE TABLET BY MOUTH ONE TIME DAILY Janith Lima, MD Taking Active   clopidogrel (PLAVIX) 75 MG tablet [id number] Yes Take 1 tablet (75 mg total) by mouth daily. Follow-up appt is due must see provider for future refills Janith Lima, MD Taking Active   collagenase Annitta Needs) ointment [id number] Yes Apply 1 application topically daily. Phoebe Sharps C, PA-C Taking Active   cyanocobalamin ((VITAMIN B-12)) injection 1,000 mcg [id number]   Janith Lima, MD  Active   Cyanocobalamin (B-12 COMPLIANCE INJECTION IJ)  [id number] Yes Inject 1 Dose as directed every 30 (thirty) days. [provider] Taking Active Self  DULoxetine (CYMBALTA) 60 MG capsule [id number] Yes TAKE ONE CAPSULE BY MOUTH ONE TIME DAILY Janith Lima, MD Taking Active   memantine [facility]) 10 MG tablet [id number] Yes Take 1 tablet (10 mg total) by mouth 2 (two) times daily. Frann Rider, NP Taking Active   solifenacin (VESICARE) 10 MG tablet [id number] Yes TAKE ONE TABLET BY MOUTH ONE TIME DAILY Janith Lima, MD Taking Active   white petrolatum (VASELINE) GEL [id number] Yes Apply 1 application topically as needed for lip care. [provider] Taking Active             Patient Active Problem List   Diagnosis Date Noted   Skin tear of right lower leg without complication 74/03/1447   OAB (overactive bladder) 02/23/2020   Cerebrovascular accident (CVA) due to stenosis of right posterior cerebral artery ([place]) 02/08/2020   Stage 3b chronic kidney disease ([place]) 01/26/2020   Current severe episode of major depressive disorder without psychotic features without prior episode ([place]) 01/25/2020   Vitamin B12 deficiency neuropathy ([place]) 01/25/2020   Primary osteoarthritis involving multiple joints 12/08/2017   Thoracic aorta atherosclerosis ([place]) 12/12/2014   Routine general medical examination at a health care facility 06/09/2013   Dyslipidemia, goal LDL below 130 09/20/2010   Essential hypertension 12/22/2008    Immunization History  Administered Date(s) Administered   Fluad Quad(high Dose 65+) 05/15/2020   Influenza Whole 05/22/2010, 06/12/2012   Influenza, High Dose Seasonal PF 05/28/2017   Influenza-Unspecified 05/13/2013, 06/05/2016, 04/08/2018   Moderna Sars-Covid-2 Vaccination 12/28/2019, 01/12/2020   Pneumococcal Conjugate-13 06/09/2013   Pneumococcal Polysaccharide-23 03/18/2017   Td 09/20/2010   Tdap 06/19/2020   Zoster, Live 09/20/2010    Conditions to be addressed/monitored:  Hypertension,  Hyperlipidemia, Coronary Artery Disease, Depression, Overactive Bladder, and Vit B12 deficiency   Care Plan : CCM Care Plan  Updates made by Tomasa Blase, RPH since 01/26/2021 12:00 AM     Problem: HTN, HLD, OAB, Prior  CVA, Depression, Dementia   Priority: High  Onset Date: 01/26/2021     Long-Range Goal: Disease Management   Start Date: 01/26/2021  Expected End Date: 07/28/2021  This Visit's Progress: On track  Priority: High  Note:   Current Barriers:  Unable to independently monitor therapeutic efficacy Does not adhere to prescribed medication regimen  Pharmacist Clinical Goal(s):  Patient will verbalize ability to afford treatment regimen achieve adherence to monitoring guidelines and medication adherence to achieve therapeutic efficacy achieve control of LDL  as evidenced by lipid panel maintain control of BP as evidenced by BP office readings remaining in range  achieve ability to self administer medications as prescribed through use of pill box as evidenced by patient report through collaboration with PharmD and provider.   Interventions: 1:1 collaboration with Janith Lima, MD regarding development and update of comprehensive plan of care as evidenced by provider attestation and co-signature Inter-disciplinary care team collaboration (see longitudinal plan of care) Comprehensive medication review performed; medication list updated in electronic medical record  Hypertension (BP goal <140/90) -Controlled per most recent office blood pressures (134/85, 118/84, 120/72) -Current treatment: N/a -Medications previously tried: metoprolol, olmesartan  -Current home readings: n/a -Current dietary habits: limited dietary changes, per daughter, patient has a poor appetite, and it is difficult to control intake  -Current exercise habits: limited - patient ambulates with a walker  -Denies hypotensive/hypertensive symptoms -Educated on BP goals and benefits of medications for  prevention of heart attack, stroke and kidney damage; -Recommended no changes at this time   Hyperlipidemia/ Prior CVA (01/31/2020): (LDL goal < 70) -Unknown at this time  - Last LDL - 123 mg/dL (02/01/2020) - has not been checked since staring atorvastatin 68m  -Current treatment: Atorvastatin 455mdaily  Clopidogrel 7575maily  -Medications previously tried: n/a  -Current dietary patterns: patient has poor appetite, per daughter eating sugary snacks when she is hungry  -Current exercise habits: limited - ambulates with a walker  -Educated on Cholesterol goals;  Benefits of statin for ASCVD risk reduction; Importance of limiting foods high in cholesterol; -Recommended to continue current medication - recheck lipid panel with next office visit to determine if current statin dose is sufficient to control LDL  Depression / Dementia (Goal: Mood control) -Controlled -Current treatment: Duloxetine 45m62mily  Memantine 10mg14mce daily  -Medications previously tried/failed: trintellix, lorazepam  -PHQ9: declined  -Noted that days patient remembers to take duloxetine depression is well controlled -Educated on Benefits of medication for symptom control Benefits of cognitive-behavioral therapy with or without medication -Recommended to continue current medication  Overactive Bladder (Goal: Urinary control / decreased frequency of urination) -Controlled -Current treatment  Vesicare 10mg 86my  -Medications previously tried: gemtesa  -Recommended to continue current medication  Vitamin B12 Deficiency (Goal: Maintenance of goal B12 levels) -Not ideally controlled - Last B12 level - 168 pg/mL (decreased) - 01/25/2020 -Current treatment  Cyanocobalamin 1000mcg 45m00mcg i64mted every 30 days  -Medications previously tried: n/a  -Recommended to continue current medication  Health Maintenance -Vaccine gaps: shingles, COVID 19 booster  -Current therapy:  Collagenase ointment - applied  daily as needed  Vasaline - applied daily as needed  -Educated on Cost vs benefit of each product must be carefully weighed by individual consumer -Patient is satisfied with current therapy and denies issues -Recommended to continue current medication   Patient Goals/Self-Care Activities Patient will:  - take medications as prescribed focus on medication adherence by continuing to use pill box, setting reminders so  that medication administrations are not missed   Follow Up Plan: Telephone follow up appointment with care management team member scheduled for: The patient has been provided with contact information for the care management team and has been advised to call with any health related questions or concerns.        Medication Assistance: None required.  Patient affirms current coverage meets needs.  Patient's preferred pharmacy is:  [facility] [address] [place] [address] [place] [address] Phone: [phone] Fax: [phone]  [id number]: [phone]: [phone]  Uses pill box? Yes Pt endorses 80-90% compliance  Care Plan and Follow Up Patient Decision:  Patient agrees to Care Plan and Follow-up.  Plan: Telephone follow up appointment with care management team member scheduled for:  3 months  and The patient has been provided with contact information for the care management team and has been advised to call with any health related questions or concerns.   DTomasa Blase PharmD Clinical Pharmacist, [place]

## 2021-01-26 ENCOUNTER — Ambulatory Visit (INDEPENDENT_AMBULATORY_CARE_PROVIDER_SITE_OTHER): Payer: Medicare Other

## 2021-01-26 ENCOUNTER — Other Ambulatory Visit: Payer: Self-pay

## 2021-01-26 DIAGNOSIS — E785 Hyperlipidemia, unspecified: Secondary | ICD-10-CM | POA: Diagnosis not present

## 2021-01-26 DIAGNOSIS — N3281 Overactive bladder: Secondary | ICD-10-CM

## 2021-01-26 DIAGNOSIS — I10 Essential (primary) hypertension: Secondary | ICD-10-CM

## 2021-01-26 DIAGNOSIS — I700 Atherosclerosis of aorta: Secondary | ICD-10-CM

## 2021-01-26 NOTE — Patient Instructions (Signed)
Visit Information   PATIENT GOALS:   Goals Addressed             This Visit's Progress    Manage My Medicine       Timeframe:  Long-Range Goal Priority:  High Start Date:  01/26/2021                           Expected End Date:  07/28/2021                     Follow Up Date 07/27/2021   - call for medicine refill 2 or 3 days before it runs out - keep a list of all the medicines I take; vitamins and herbals too - learn to read medicine labels - use a pillbox to sort medicine - use an alarm clock or phone to remind me to take my medicine    Why is this important?   These steps will help you keep on track with your medicines.            Consent to CCM Services: Ms. Kamaka was given information about Chronic Care Management services today including:  CCM service includes personalized support from designated clinical staff supervised by her physician, including individualized plan of care and coordination with other care providers 24/7 contact phone numbers for assistance for urgent and routine care needs. Service will only be billed when office clinical staff spend 20 minutes or more in a month to coordinate care. Only one practitioner may furnish and bill the service in a calendar month. The patient may stop CCM services at any time (effective at the end of the month) by phone call to the office staff. The patient will be responsible for cost sharing (co-pay) of up to 20% of the service fee (after annual deductible is met).  Patient agreed to services and verbal consent obtained.   Patient verbalizes understanding of instructions provided today and agrees to view in [place].   Telephone follow up appointment with care management team member scheduled for: The patient has been provided with contact information for the care management team and has been advised to call with any health related questions or concerns.   Tomasa Blase, PharmD Clinical Pharmacist, [place]    CLINICAL CARE PLAN: Patient Care Plan: CCM Care Plan     Problem Identified: HTN, HLD, OAB, Prior CVA, Depression, Dementia   Priority: High  Onset Date: 01/26/2021     Long-Range Goal: Disease Management   Start Date: 01/26/2021  Expected End Date: 07/28/2021  This Visit's Progress: On track  Priority: High  Note:   Current Barriers:  Unable to independently monitor therapeutic efficacy Does not adhere to prescribed medication regimen  Pharmacist Clinical Goal(s):  Patient will verbalize ability to afford treatment regimen achieve adherence to monitoring guidelines and medication adherence to achieve therapeutic efficacy achieve control of LDL  as evidenced by lipid panel maintain control of BP as evidenced by BP office readings remaining in range  achieve ability to self administer medications as prescribed through use of pill box as evidenced by patient report through collaboration with PharmD and provider.   Interventions: 1:1 collaboration with Janith Lima, MD regarding development and update of comprehensive plan of care as evidenced by provider attestation and co-signature Inter-disciplinary care team collaboration (see longitudinal plan of care) Comprehensive medication review performed; medication list updated in electronic medical record  Hypertension (BP goal <140/90) -  Controlled per most recent office blood pressures (134/85, 118/84, 120/72) -Current treatment: N/a -Medications previously tried: metoprolol, olmesartan  -Current home readings: n/a -Current dietary habits: limited dietary changes, per daughter, patient has a poor appetite, and it is difficult to control intake  -Current exercise habits: limited - patient ambulates with a walker  -Denies hypotensive/hypertensive symptoms -Educated on BP goals and benefits of medications for prevention of heart attack, stroke and kidney damage; -Recommended no changes at this  time   Hyperlipidemia/ Prior CVA (01/31/2020): (LDL goal < 70) -Unknown at this time  - Last LDL - 123 mg/dL (02/01/2020) - has not been checked since staring atorvastatin 22m  -Current treatment: Atorvastatin 434mdaily  Clopidogrel 7520maily  -Medications previously tried: n/a  -Current dietary patterns: patient has poor appetite, per daughter eating sugary snacks when she is hungry  -Current exercise habits: limited - ambulates with a walker  -Educated on Cholesterol goals;  Benefits of statin for ASCVD risk reduction; Importance of limiting foods high in cholesterol; -Recommended to continue current medication - recheck lipid panel with next office visit to determine if current statin dose is sufficient to control LDL  Depression / Dementia (Goal: Mood control) -Controlled -Current treatment: Duloxetine 58m89mily  Memantine 10mg1mce daily  -Medications previously tried/failed: trintellix, lorazepam  -PHQ9: declined  -Noted that days patient remembers to take duloxetine depression is well controlled -Educated on Benefits of medication for symptom control Benefits of cognitive-behavioral therapy with or without medication -Recommended to continue current medication  Overactive Bladder (Goal: Urinary control / decreased frequency of urination) -Controlled -Current treatment  Vesicare 10mg 20my  -Medications previously tried: gemtesa  -Recommended to continue current medication  Vitamin B12 Deficiency (Goal: Maintenance of goal B12 levels) -Not ideally controlled - Last B12 level - 168 pg/mL (decreased) - 01/25/2020 -Current treatment  Cyanocobalamin 1000mcg 52m00mcg i65mted every 30 days  -Medications previously tried: n/a  -Recommended to continue current medication  Health Maintenance -Vaccine gaps: shingles, COVID 19 booster  -Current therapy:  Collagenase ointment - applied daily as needed  Vasaline - applied daily as needed  -Educated on Cost vs benefit of  each product must be carefully weighed by individual consumer -Patient is satisfied with current therapy and denies issues -Recommended to continue current medication   Patient Goals/Self-Care Activities Patient will:  - take medications as prescribed focus on medication adherence by continuing to use pill box, setting reminders so that medication administrations are not missed   Follow Up Plan: Telephone follow up appointment with care management team member scheduled for: The patient has been provided with contact information for the care management team and has been advised to call with any health related questions or concerns.

## 2021-01-27 ENCOUNTER — Other Ambulatory Visit: Payer: Self-pay | Admitting: Internal Medicine

## 2021-01-27 DIAGNOSIS — I63531 Cerebral infarction due to unspecified occlusion or stenosis of right posterior cerebral artery: Secondary | ICD-10-CM

## 2021-01-27 MED ORDER — CLOPIDOGREL BISULFATE 75 MG PO TABS: 75.0000 mg | ORAL_TABLET | Freq: Every day | ORAL | 0 refills | Status: DC

## 2021-02-16 ENCOUNTER — Ambulatory Visit (INDEPENDENT_AMBULATORY_CARE_PROVIDER_SITE_OTHER): Payer: Medicare Other

## 2021-02-16 ENCOUNTER — Other Ambulatory Visit: Payer: Self-pay

## 2021-02-16 DIAGNOSIS — E538 Deficiency of other specified B group vitamins: Secondary | ICD-10-CM | POA: Diagnosis not present

## 2021-02-16 DIAGNOSIS — G63 Polyneuropathy in diseases classified elsewhere: Secondary | ICD-10-CM

## 2021-02-16 MED ORDER — CYANOCOBALAMIN 1000 MCG/ML IJ SOLN
1000.0000 ug | Freq: Once | INTRAMUSCULAR | Status: AC
  Administered 2021-02-16: 1000 ug via INTRAMUSCULAR

## 2021-02-16 NOTE — Progress Notes (Signed)
Pt here for monthly B12 injection per Dr. Ronnald Ramp  B12 1063mcg given IM, and pt tolerated injection well.  Next B12 injection scheduled for 03/19/21

## 2021-03-22 ENCOUNTER — Other Ambulatory Visit: Payer: Self-pay

## 2021-03-22 ENCOUNTER — Ambulatory Visit (INDEPENDENT_AMBULATORY_CARE_PROVIDER_SITE_OTHER): Payer: Medicare Other

## 2021-03-22 DIAGNOSIS — E538 Deficiency of other specified B group vitamins: Secondary | ICD-10-CM | POA: Diagnosis not present

## 2021-03-22 DIAGNOSIS — G63 Polyneuropathy in diseases classified elsewhere: Secondary | ICD-10-CM | POA: Diagnosis not present

## 2021-03-22 MED ORDER — CYANOCOBALAMIN 1000 MCG/ML IJ SOLN
1000.0000 ug | Freq: Once | INTRAMUSCULAR | Status: AC
  Administered 2021-03-22: 1000 ug via INTRAMUSCULAR

## 2021-03-22 NOTE — Progress Notes (Signed)
Pt given B12 w/o any complications. °

## 2021-04-13 ENCOUNTER — Telehealth: Payer: Self-pay

## 2021-04-13 NOTE — Chronic Care Management (AMB) (Signed)
    Chronic Care Management Pharmacy Assistant   Name: Jocelyn [name]  MRN: [id number] DOB: [birth year]   Reason for Encounter:General Disease State     Recent office visits:  None noted  Recent consult visits:  None noted  Hospital visits:  None in previous 6 months  Medications: Outpatient Encounter Medications as of 04/13/2021  Medication Sig   atorvastatin (LIPITOR) 40 MG tablet TAKE ONE TABLET BY MOUTH ONE TIME DAILY   clopidogrel (PLAVIX) 75 MG tablet Take 1 tablet (75 mg total) by mouth daily.   collagenase (SANTYL) ointment Apply 1 application topically daily.   Cyanocobalamin (B-12 COMPLIANCE INJECTION IJ) Inject 1 Dose as directed every 30 (thirty) days.   DULoxetine (CYMBALTA) 60 MG capsule TAKE ONE CAPSULE BY MOUTH ONE TIME DAILY   memantine (NAMENDA) 10 MG tablet Take 1 tablet (10 mg total) by mouth 2 (two) times daily.   solifenacin (VESICARE) 10 MG tablet TAKE ONE TABLET BY MOUTH ONE TIME DAILY   white petrolatum (VASELINE) GEL Apply 1 application topically as needed for lip care.   Facility-Administered Encounter Medications as of 04/13/2021  Medication   cyanocobalamin ((VITAMIN B-12)) injection 1,000 mcg    Have you had any problems recently with your health? Patient states she has no problems with her health at this time.  Have you had any problems with your pharmacy? Patient states she does not have any problems with her pharmacy she states she has a 90 day supply of all her medications.  What issues or side effects are you having with your medications? Patient states she has no issues or side effects with any of her medications.  What would you like me to pass along to [facility] for them to help you with?  Patient states there is nothing at this time.  What can we do to take care of you better? Patient states there is nothing at this time.   Star Rating Drugs: Atorvastatin 40 mg Last filled:None listed  Corrie Mckusick, [place]

## 2021-04-18 ENCOUNTER — Other Ambulatory Visit: Payer: Self-pay | Admitting: Internal Medicine

## 2021-04-18 DIAGNOSIS — F322 Major depressive disorder, single episode, severe without psychotic features: Secondary | ICD-10-CM

## 2021-04-18 DIAGNOSIS — M8949 Other hypertrophic osteoarthropathy, multiple sites: Secondary | ICD-10-CM

## 2021-04-18 DIAGNOSIS — M159 Polyosteoarthritis, unspecified: Secondary | ICD-10-CM

## 2021-04-19 ENCOUNTER — Ambulatory Visit (INDEPENDENT_AMBULATORY_CARE_PROVIDER_SITE_OTHER): Payer: Medicare Other

## 2021-04-19 ENCOUNTER — Other Ambulatory Visit: Payer: Self-pay | Admitting: Internal Medicine

## 2021-04-19 ENCOUNTER — Other Ambulatory Visit: Payer: Self-pay

## 2021-04-19 DIAGNOSIS — E538 Deficiency of other specified B group vitamins: Secondary | ICD-10-CM

## 2021-04-19 DIAGNOSIS — G63 Polyneuropathy in diseases classified elsewhere: Secondary | ICD-10-CM | POA: Diagnosis not present

## 2021-04-19 DIAGNOSIS — N3281 Overactive bladder: Secondary | ICD-10-CM

## 2021-04-19 MED ORDER — CYANOCOBALAMIN 1000 MCG/ML IJ SOLN
1000.0000 ug | Freq: Once | INTRAMUSCULAR | Status: AC
  Administered 2021-04-19: 1000 ug via INTRAMUSCULAR

## 2021-04-19 NOTE — Progress Notes (Signed)
B12 given w/o any complicaitons.

## 2021-04-23 ENCOUNTER — Ambulatory Visit (INDEPENDENT_AMBULATORY_CARE_PROVIDER_SITE_OTHER): Payer: Medicare Other | Admitting: Adult Health

## 2021-04-23 ENCOUNTER — Encounter: Payer: Self-pay | Admitting: Adult Health

## 2021-04-23 VITALS — BP 124/83 | HR 96 | Ht 66.0 in | Wt 139.0 lb

## 2021-04-23 DIAGNOSIS — I69319 Unspecified symptoms and signs involving cognitive functions following cerebral infarction: Secondary | ICD-10-CM

## 2021-04-23 DIAGNOSIS — I63531 Cerebral infarction due to unspecified occlusion or stenosis of right posterior cerebral artery: Secondary | ICD-10-CM

## 2021-04-23 NOTE — Progress Notes (Signed)
[facility] [address] [place]. [place] [id number] [phone]       STROKE FOLLOW UP NOTE  Ms. Semeka Ellestad Date of Birth:  12-29-1931 Medical Record Number:  [id number]   Reason for Referral: stroke follow up    SUBJECTIVE:   CHIEF COMPLAINT:  Chief Complaint  Patient presents with   Cognitive deficit    Rm 2, 6 month FU dgtr- Tammy  MMSE 20    HPI:   Today, 04/23/2021, Ms. Stagnitta returns for 10-monthfollow-up regarding history of stroke and cognitive deficit accompanied by daughter. Does have short term memory difficulties.  Continues to live alone with family checking on her frequently but family becoming more concerned about the safety of her continuing to live alone.  She is resistant to moving into an assisted living or getting additional assistance at home.  Daughter assists with organizing pills in pill box but at times, patient will forget to take medications. Remains on Namenda but daughter questioning benefit as sge believes cognition has been gradually declining.  She will occasionally do word finding puzzles but otherwise relatively sedentary.  Maintains ADLs and majority of IADls - daughter assist with medications and bill paying. Also assists with transpiration as patient does not drive. Continues to use RW outdoors - does not always use in home.  Does report a couple of falls in the home thankfully without injury. Stable from stroke standpoint without new stroke/TIA symptoms.  Remains on Plavix and atorvastatin.  Blood pressure today 124/83.  No further concerns at this time    History provided for reference purposes only Update 10/17/2020 JM: Ms. PShillinglawreturns for 451-monthcheduled follow-up with prior stroke history and cognitive decline.  She is accompanied by her daughter, TaLynelle SmokeResidual stroke deficits of cognitive impairment and imbalance Denies new stroke/TIA symptoms  At prior visit, concern for worsening gait with frequent falls and  cognitive decline - MRI w/wo negative for acute abnormalities, EEG negative, dementia panel within normal limits except slightly low B12 level (although still WNL) with known B12 deficiency She has not had any additional falls and continues to use rolling walker for assistance -previously using cane  She continues to live independently and maintains ADLs independently Daughter assists with majority of IADL's Does not currently drive which she is frustrated about Per daughter, she has noted a continued decline in cognition specifically with short-term memory She remains sedentary watching TV majority of the day -patient reports activity and exercise during the day but per daughter, this is not accurate Per daughter, occasional agitation and increased confusion such as believing her husband is with her in her home - she denies actually seeing him but "feels his presence"  On Namenda '10mg'$  twice daily tolerating without side effects On Cymbalta per PCP for depression/anxiety with noted improvement  MMSE today 23/30 (prior 23/30)  Reports compliance on Plavix and atorvastatin -denies side effects Blood pressure today 134/85  No further concerns at this time  Update 06/19/2020 JM: Ms. PhMcferraneturns for stroke follow-up accompanied by her granddaughter.  Granddaughter sent my chart message on 06/18/2020 with concerns of frequent falls and gradual worsening cognition over the past 3 months (see MyChart message 11/7). Per granddaughter, she has had multiple falls with 2 falls over the past month with fall on 10/12 apparently in the middle of the night but did not call her granddaughter until later the following day. Per granddaughter, multiple lacerations including back of her head on the left side and 3  areas in her left arm.  She refused to be evaluated at that time.  She had an additional fall recently thankfully without injury.  She is currently living independently but does have family checking on her  frequently and cameras throughout the house but granddaughter has appropriate safety concerns as she is unaware of her physical and cognitive deficits.  Granddaughter attempted to place her in ALF with patient adamantly refused.  She will be agitated or irritable quickly, frequently ask where her husband is (who passed away from Alzheimer's disease) and become more confused in the evening or initially upon awakening in the morning.  Reports sleeping well throughout the night.  She does admit to depression/anxiety with PCP previously starting her on duloxetine 30 mg daily but denies any benefit.  Admits to sedentary lifestyle with majority of her days spent watching TV.  Previously working with therapies but these were discontinued due to lack of participation and improvement.  Denies focal deficits such as speech difficulty, hemiparesis, visual changes or sensory changes.  She has remained on aspirin and Plavix despite 67-monthrecommendation without side effects.  Remains on atorvastatin 40 mg daily without myalgias.  Blood pressure today 147/90.  No further concerns at this time.  Initial visit 03/01/2020 JM: Ms. PRainieris being seen for hospital follow-up accompanied by her granddaughter JEliezer Lofts  Residual deficits of mild imbalance and mild cognitive impairment which has been improving.  Currently working with home health therapy which is to be completed after additional 2 sessions.  She was evaluated by OT or SLP.  Previously living independently but currently staying with family due to safety concerns in regards to ambulation, performing ADLs and IADLs and cognitive impairment.  Currently only on Plavix and discontinued aspirin after 3 weeks (per granddaughter, this was advised at hospital discharge).  Denies bleeding or bruising currently or while on DAPT.  Continues on atorvastatin without myalgias.  Blood pressure today 130/70.  No other concerns at this time.  Stroke admission 01/31/2020 Personally  reviewed all hospital notes and pertinent lab work and imaging Ms. MMuriel Babersis a 87y.o. female with history of HTN, HLD  who presented on 01/31/2020 with waxing and waning confusion, left-sided visual loss and R side numbness.  Stroke work-up revealed right PCA infarct in setting of right PCA high-grade stenosis, infarct secondary to large vessel disease source.  CTA head/neck diffuse intracranial stenosis (additional information below).  Recommended DAPT for 3 months then Plavix alone as previously on aspirin.  HTN stable.  LDL 123 initiate atorvastatin 40 mg daily.  Other stroke risk factors include advanced age, former tobacco use, EtOH use, family history of stroke but no personal history of stroke.  Evaluated by therapy and recommended outpatient PT and discharged home in stable condition.  Stroke:   R PCA infarct in setting of R PCA high-grade stenosis, infarcts secondary to large vessel disease source MRI  Acute and subacute R occipital infarcts. Moderate small vessel disease.  CTA head & neck proximal R P2 high-grade stenosis associated w/ infarct. R P3 superior division occlusion. Moderate L P2 stenosis. L A1 high-grade stenosis. Moderate mid R frontoparietal branch stenosis RM3. B narrowing ACA and MCA branches. Supraclinoid R ICA 50%. B ICA bifurcation atherosclerosis. 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 No source of embolus  LDL 123 HgbA1c pending  aspirin 81 mg daily prior to admission, now on aspirin 81 mg daily and clopidogrel 75 mg daily.  Continue DAPT x 3 months then plavix alone Therapy recommendations:  OP PT Disposition:  D/c home      ROS:   14 system review of systems performed and negative with exception of those listed in HPI  PMH:  Past Medical History:  Diagnosis Date   HTN (hypertension)    Hyperlipidemia    Urinary incontinence     PSH:  Past Surgical History:  Procedure Laterality Date    ABDOMINAL HYSTERECTOMY     TONSILLECTOMY      Social History:  Social History   Socioeconomic History   Marital status: Widowed    Spouse name: Not on file   Number of children: 1   Years of education: Not on file   Highest education level: Not on file  Occupational History   Occupation: Retired  Tobacco Use   Smoking status: Former   Smokeless tobacco: Former    Quit date: 03/17/2017   Tobacco comments:    [place] 25 YEARS AGO  Vaping Use   Vaping Use: Never used  Substance and Sexual Activity   Alcohol use: Yes    Alcohol/week: 14.0 standard drinks    Types: 7 Glasses of wine, 7 Cans of beer per week   Drug use: No   Sexual activity: Never    Birth control/protection: Surgical  Other Topics Concern   Not on file  Social History Narrative   Regular Exercise-no         Social Determinants of Health   Financial Resource Strain: Not on file  Food Insecurity: Not on file  Transportation Needs: Not on file  Physical Activity: Not on file  Stress: Not on file  Social Connections: Not on file  Intimate Partner Violence: Not on file    Family History:  Family History  Problem Relation Age of Onset   Diabetes Other        1st degree relative   Hyperlipidemia Other    Stroke Other        1st degree Female relative <50   Cancer Neg Hx    Early death Neg Hx    Heart disease Neg Hx    Hypertension Neg Hx    Kidney disease Neg Hx     Medications:   Current Outpatient Medications on File Prior to Visit  Medication Sig Dispense Refill   atorvastatin (LIPITOR) 40 MG tablet TAKE ONE TABLET BY MOUTH ONE TIME DAILY 90 tablet 1   clopidogrel (PLAVIX) 75 MG tablet Take 1 tablet (75 mg total) by mouth daily. 90 tablet 0   Cyanocobalamin (B-12 COMPLIANCE INJECTION IJ) Inject 1 Dose as directed every 30 (thirty) days.     DULoxetine (CYMBALTA) 60 MG capsule TAKE ONE CAPSULE BY MOUTH ONE TIME DAILY 90 capsule 0   memantine (NAMENDA) 10 MG tablet Take 1 tablet (10 mg  total) by mouth 2 (two) times daily. 180 tablet 3   solifenacin (VESICARE) 10 MG tablet TAKE ONE TABLET BY MOUTH ONE TIME DAILY 90 tablet 1   collagenase (SANTYL) ointment Apply 1 application topically daily. (Patient not taking: Reported on 04/23/2021) 15 g 0   white petrolatum (VASELINE) GEL Apply 1 application topically as needed for lip care. (Patient not taking: Reported on 04/23/2021)     Current Facility-Administered Medications on File Prior to Visit  Medication Dose Route Frequency Provider Last Rate Last Admin   cyanocobalamin ((VITAMIN B-12)) injection 1,000 mcg  1,000 mcg Intramuscular Q30 days Janith Lima, MD   1,000  mcg at 12/01/20 1416    Allergies:   Allergies  Allergen Reactions   Codeine Itching and Nausea And Vomiting   Tape Rash    Tears the skin      OBJECTIVE:  Physical Exam  Vitals:   04/23/21 1253  BP: 124/83  Pulse: 96  Weight: 139 lb (63 kg)  Height: '5\' 6"'$  (1.676 m)    Body mass index is 22.44 kg/m. No results found.  General: well developed, well nourished, pleasant elderly Caucasian female, seated, in no evident distress Head: head normocephalic and atraumatic.   Neck: supple with no carotid or supraclavicular bruits Cardiovascular: regular rate and rhythm, no murmurs Musculoskeletal: no deformity Skin:  no rash/petichiae Vascular:  Normal pulses all extremities   Neurologic Exam Mental Status: Awake and fully alert.  Fluent speech and language.  Oriented to place and time. Recent memory impaired and remote memory intact. Attention span, concentration and fund of knowledge impaired. Mood and affect appropriate and cooperative with history taking and exam.  MMSE - Mini Mental State Exam 04/23/2021 10/17/2020 06/19/2020  Not completed: - - -  Orientation to time '4 3 4  '$ Orientation to Place '4 5 5  '$ Registration '3 3 3  '$ Attention/ Calculation 0 2 1  Recall '2 1 2  '$ Language- name 2 objects '2 2 2  '$ Language- repeat 0 1 1  Language- follow 3 step  command '3 3 3  '$ Language- read & follow direction '1 1 1  '$ Write a sentence '1 1 1  '$ Copy design 0 1 1  Copy design-comments - - 6 animals  Total score '20 23 24   '$ Cranial Nerves: Pupils equal, briskly reactive to light. Extraocular movements full without nystagmus. Visual fields full to confrontation. Hearing intact. Facial sensation intact. Face, tongue, palate moves normally and symmetrically.  Motor: Normal bulk and tone. Normal strength in all tested extremity muscles  Sensory.: intact to touch , pinprick , position and vibratory sensation.  Coordination: Rapid alternating movements normal in all extremities except slightly decreased left hand. Finger-to-nose and heel-to-shin performed accurately bilaterally. Gait and Station: Arises from chair without difficulty. Stance is normal. Gait demonstrates normal stride length and minimal imbalance with use of rolling walker Reflexes: 1+ and symmetric. Toes downgoing.       ASSESSMENT/PLAN: Lolisa Musch is a 87 y.o. year old female presented with waxing/waning confusion, right-sided numbness and left-sided visual impairment on 01/31/2020 with stroke work-up revealing R PCA infarct in setting of R PCA high-grade stenosis, infarcts secondary to large vessel disease source. Vascular risk factors include HTN, HLD, diffuse intracranial stenosis, former tobacco use, advanced age and EtOH use.      Cognitive impairment: MMSE 20/30  (prior 23/30) Discussed importance of ensuring taking medication as prescribed and use of compensation strategies and reminders placed around her home.  Discussed importance of memory exercises and routine physical activity as tolerated as well as adequate sleep and managing stroke risk factors Continue Namenda 10 mg twice daily - unable to tell full benefit due to intermittent use EEG 06/26/2020 normal MR brain w/wo 06/23/2020 no new or acute abnormalities Dementia panel WNL (mild low B12 -receiving supplementation thru  PCP) R PCA stroke:  Residual deficits: Cognitive impairment and imbalance.  Discussed importance of use of rolling walker at all times for fall prevention even in the home Continue clopidogrel 75 mg daily and atorvastatin 40 mg daily for secondary stroke prevention.  Discussed secondary stroke prevention measures and importance of close PCP follow-up for aggressive  stroke risk factor management. HTN: BP goal<130/90.  Stable currently diet controlled per PCP HLD: LDL goal<70. On atorvastatin 40 mg daily per PCP     Follow up in 6 months or call earlier if needed   CC:  [facility] provider: Dr. Lenda Kelp, Arvid Right, MD    I spent 36 minutes of face-to-face and non-face-to-face time with patient and daughter.  This included previsit chart review, lab review, study review, order entry, electronic health record documentation, patient education regarding gradual cognitive decline and imbalance, completion and review of MMSE, history of stroke, importance of managing stroke risk factors and answered all other questions to patient and daughters satisfaction   Frann Rider, [facility]  [facility] [address] [place], [place] [address]  Phone [phone] Fax [phone] Note: This document was prepared with digital dictation and possible smart phrase technology. Any transcriptional errors that result from this process are unintentional.

## 2021-04-30 ENCOUNTER — Encounter: Payer: Self-pay | Admitting: Adult Health

## 2021-05-17 ENCOUNTER — Ambulatory Visit (INDEPENDENT_AMBULATORY_CARE_PROVIDER_SITE_OTHER): Payer: Medicare Other

## 2021-05-17 ENCOUNTER — Other Ambulatory Visit: Payer: Self-pay

## 2021-05-17 DIAGNOSIS — Z23 Encounter for immunization: Secondary | ICD-10-CM | POA: Diagnosis not present

## 2021-05-17 DIAGNOSIS — E538 Deficiency of other specified B group vitamins: Secondary | ICD-10-CM

## 2021-05-17 DIAGNOSIS — G63 Polyneuropathy in diseases classified elsewhere: Secondary | ICD-10-CM

## 2021-05-17 MED ORDER — CYANOCOBALAMIN 1000 MCG/ML IJ SOLN
1000.0000 ug | Freq: Once | INTRAMUSCULAR | Status: AC
  Administered 2021-05-17: 1000 ug via INTRAMUSCULAR

## 2021-05-17 NOTE — Progress Notes (Signed)
Pt given B12 w/o any complications. °

## 2021-05-18 ENCOUNTER — Ambulatory Visit: Payer: Medicare Other

## 2021-06-05 DIAGNOSIS — Z23 Encounter for immunization: Secondary | ICD-10-CM | POA: Diagnosis not present

## 2021-06-14 ENCOUNTER — Ambulatory Visit (INDEPENDENT_AMBULATORY_CARE_PROVIDER_SITE_OTHER): Payer: Medicare Other

## 2021-06-14 ENCOUNTER — Other Ambulatory Visit: Payer: Self-pay

## 2021-06-14 VITALS — BP 122/70 | HR 92 | Temp 98.2°F | Ht 66.0 in | Wt 132.8 lb

## 2021-06-14 DIAGNOSIS — Z0000 Encounter for general adult medical examination without abnormal findings: Secondary | ICD-10-CM

## 2021-06-14 DIAGNOSIS — G63 Polyneuropathy in diseases classified elsewhere: Secondary | ICD-10-CM

## 2021-06-14 DIAGNOSIS — E538 Deficiency of other specified B group vitamins: Secondary | ICD-10-CM

## 2021-06-14 MED ORDER — CYANOCOBALAMIN 1000 MCG/ML IJ SOLN
1000.0000 ug | Freq: Once | INTRAMUSCULAR | Status: AC
  Administered 2021-06-14: 1000 ug via INTRAMUSCULAR

## 2021-06-14 NOTE — Progress Notes (Addendum)
Subjective:   Jocelyn [name] is a 87 y.o. female who presents for Medicare Annual (Subsequent) preventive examination.  Review of Systems     Cardiac Risk Factors include: advanced age (>33men, >49 women);family history of premature cardiovascular disease;hypertension;dyslipidemia     Objective:    Today's Vitals   06/14/21 1445  BP: 122/70  Pulse: 92  Temp: 98.2 F (36.8 C)  SpO2: 98%  Weight: 132 lb 12.8 oz (60.2 kg)  Height: 5\' 6"  (1.676 m)  PainSc: 0-No pain   Body mass index is 21.43 kg/m.  Advanced Directives 06/14/2021 06/29/2020 06/19/2020 02/16/2020 12/08/2017 03/11/2016 01/04/2015  Does Patient Have a Medical Advance Directive? Yes No No No No Yes No  Type of Advance Directive Living will;Healthcare Power of Attorney - - - - - -  Does patient want to make changes to medical advance directive? No - Patient declined - - Yes (MAU/Ambulatory/Procedural Areas - Information given) - - -  Copy of [place] in Chart? No - copy requested - - - - Yes -  Would patient like information on creating a medical advance directive? - No - Patient declined No - Patient declined - Yes (ED - Information included in AVS) - Yes - Educational materials given    Current Medications (verified) Outpatient Encounter Medications as of 06/14/2021  Medication Sig   atorvastatin (LIPITOR) 40 MG tablet TAKE ONE TABLET BY MOUTH ONE TIME DAILY   clopidogrel (PLAVIX) 75 MG tablet Take 1 tablet (75 mg total) by mouth daily.   collagenase (SANTYL) ointment Apply 1 application topically daily. (Patient not taking: Reported on 04/23/2021)   Cyanocobalamin (B-12 COMPLIANCE INJECTION IJ) Inject 1 Dose as directed every 30 (thirty) days.   DULoxetine (CYMBALTA) 60 MG capsule TAKE ONE CAPSULE BY MOUTH ONE TIME DAILY   memantine (NAMENDA) 10 MG tablet Take 1 tablet (10 mg total) by mouth 2 (two) times daily.   solifenacin (VESICARE) 10 MG tablet TAKE ONE TABLET BY MOUTH ONE TIME DAILY   white  petrolatum (VASELINE) GEL Apply 1 application topically as needed for lip care. (Patient not taking: Reported on 04/23/2021)   Facility-Administered Encounter Medications as of 06/14/2021  Medication   cyanocobalamin ((VITAMIN B-12)) injection 1,000 mcg    Allergies (verified) Codeine and Tape   History: Past Medical History:  Diagnosis Date   HTN (hypertension)    Hyperlipidemia    Urinary incontinence    Past Surgical History:  Procedure Laterality Date   ABDOMINAL HYSTERECTOMY     TONSILLECTOMY     Family History  Problem Relation Age of Onset   Diabetes Other        1st degree relative   Hyperlipidemia Other    Stroke Other        1st degree Female relative <50   Cancer Neg Hx    Early death Neg Hx    Heart disease Neg Hx    Hypertension Neg Hx    Kidney disease Neg Hx    Social History   Socioeconomic History   Marital status: Widowed    Spouse name: Not on file   Number of children: 1   Years of education: Not on file   Highest education level: Not on file  Occupational History   Occupation: Retired  Tobacco Use   Smoking status: Former   Smokeless tobacco: Former    Quit date: 03/17/2017   Tobacco comments:    [place]  Use: Never used  Substance and Sexual Activity   Alcohol use: Yes    Alcohol/week: 14.0 standard drinks    Types: 7 Glasses of wine, 7 Cans of beer per week   Drug use: No   Sexual activity: Never    Birth control/protection: Surgical  Other Topics Concern   Not on file  Social History Narrative   Regular Exercise-no         Social Determinants of Health   Financial Resource Strain: Low Risk    Difficulty of Paying Living Expenses: Not hard at all  Food Insecurity: No Food Insecurity   Worried About Charity fundraiser in the Last Year: Never true   Ran Out of Food in the Last Year: Never true  Transportation Needs: No Transportation Needs   Lack of Transportation (Medical): No   Lack  of Transportation (Non-Medical): No  Physical Activity: Inactive   Days of Exercise per Week: 0 days   Minutes of Exercise per Session: 0 min  Stress: No Stress Concern Present   Feeling of Stress : Not at all  Social Connections: Moderately Isolated   Frequency of Communication with Friends and Family: More than three times a week   Frequency of Social Gatherings with Friends and Family: More than three times a week   Attends Religious Services: Never   Marine scientist or Organizations: Yes   Attends Archivist Meetings: 1 to 4 times per year   Marital Status: Widowed    Tobacco Counseling Counseling given: Not Answered Tobacco comments: STATES SHE QUIT 25 YEARS AGO   Clinical Intake:  Pre-visit preparation completed: Yes  Pain Score: 0-No pain     BMI - recorded: 21.43 Nutritional Status: BMI of 19-24  Normal Nutritional Risks: None Diabetes: No  How often do you need to have someone help you when you read instructions, pamphlets, or other written materials from your doctor or pharmacy?: 1 - Never What is the last grade level you completed in school?: HSG; 1 years of college  Diabetic? no  Interpreter Needed?: No  Information entered by :: Lisette Abu, LPN   Activities of Daily Living In your present state of health, do you have any difficulty performing the following activities: 06/14/2021 06/29/2020  Hearing? N N  Vision? N N  Difficulty concentrating or making decisions? Y N  Walking or climbing stairs? Y Y  Comment - secondary to weakness  Dressing or bathing? N N  Doing errands, shopping? Tempie Donning  Preparing Food and eating ? N -  Using the Toilet? N -  In the past six months, have you accidently leaked urine? Y -  Do you have problems with loss of bowel control? N -  Managing your Medications? N -  Managing your Finances? N -  Housekeeping or managing your Housekeeping? N -  Some recent data might be hidden    Patient Care  Team: Janith Lima, MD as PCP - General Szabat, Darnelle Maffucci, [facility] (Pharmacist) Delice Bison Darnelle Maffucci, [facility] as Pharmacist (Pharmacist) Calvert Cantor, MD as Consulting Physician (Ophthalmology)  Indicate any recent Medical Services you may have received from other than [facility] providers in the past year (date may be approximate).     Assessment:   This is a routine wellness examination for [facility].  Hearing/Vision screen Hearing Screening - Comments:: Patient denied any hearing difficulty.   No hearing aids.  Vision Screening - Comments:: Patient wears corrective glasses/contacts.  Eye exam done annually by:  [facility]  Dietary issues and exercise activities discussed: Current Exercise Habits: The patient does not participate in regular exercise at present, Exercise limited by: cardiac condition(s);psychological condition(s)   Goals Addressed   None   Depression Screen PHQ 2/9 Scores 06/14/2021 01/26/2021 06/26/2020 02/16/2020 01/25/2020 01/02/2018 12/08/2017  PHQ - 2 Score 0 - 2 0 6 0 1  PHQ- 9 Score - - 10 - 17 - 3  Exception Documentation - Patient refusal - - - - -    Fall Risk Fall Risk  06/14/2021 06/26/2020 02/16/2020 01/25/2020 07/06/2019  Falls in the past year? 1 0 0 0 1  Comment - - - - Emmi Telephone Survey: data to providers prior to load  Number falls in past yr: 0 1 0 0 1  Comment - - - - Emmi Telephone Survey Actual Response = 2  Injury with Fall? 1 1 0 0 1  Risk Factor Category  - - - - -  Risk for fall due to : Impaired balance/gait Impaired balance/gait;Impaired mobility No Fall Risks No Fall Risks -  Risk for fall due to: Comment - - - - -  Follow up Falls evaluation completed Falls evaluation completed Falls evaluation completed Falls evaluation completed -    FALL RISK PREVENTION PERTAINING TO THE HOME:  Any stairs in or around the home? No  If so, are there any without handrails? No  Home free of loose throw rugs in walkways, pet beds, electrical cords, etc? Yes   Adequate lighting in your home to reduce risk of falls? Yes   ASSISTIVE DEVICES UTILIZED TO PREVENT FALLS:  Life alert? Yes  Use of a cane, walker or w/c? Yes  Grab bars in the bathroom? Yes  Shower chair or bench in shower? Yes  Elevated toilet seat or a handicapped toilet? Yes   TIMED UP AND GO:  Was the test performed? Yes .  Length of time to ambulate 10 feet: 13 sec.   Gait slow and steady with assistive device  Cognitive Function: MMSE - Mini Mental State Exam 04/23/2021 10/17/2020 06/19/2020 12/08/2017 03/11/2016  Not completed: - - - - (No Data)  Orientation to time 4 3 4 5  -  Orientation to Place 4 5 5 5  -  Registration 3 3 3 3  -  Attention/ Calculation 0 2 1 5  -  Recall 2 1 2 3  -  Language- name 2 objects 2 2 2 2  -  Language- repeat 0 1 1 1  -  Language- follow 3 step command 3 3 3 3  -  Language- read & follow direction 1 1 1 1  -  Write a sentence 1 1 1 1  -  Copy design 0 1 1 1  -  Copy design-comments - - 6 animals - -  Total score 20 23 24 30  -        Immunizations Immunization History  Administered Date(s) Administered   Fluad Quad(high Dose 65+) 05/15/2020   Influenza Whole 05/22/2010, 06/12/2012   Influenza, High Dose Seasonal PF 05/28/2017   Influenza-Unspecified 06/05/2016, 04/08/2018, 06/05/2021   Moderna Covid-19 Vaccine Bivalent Booster 18yrs & up 05/18/2021   Moderna Sars-Covid-2 Vaccination 12/28/2019, 01/12/2020   PNEUMOCOCCAL CONJUGATE-20 05/18/2021   Pneumococcal Conjugate-13 06/09/2013   Pneumococcal Polysaccharide-23 03/18/2017   Td 09/20/2010   Tdap 06/19/2020   Zoster, Live 09/20/2010    TDAP status: Up to date  Flu Vaccine status: Up to date  Pneumococcal vaccine status: Up to date  Covid-19 vaccine status: Completed vaccines  Qualifies  for Shingles Vaccine? Yes   Zostavax completed Yes   Shingrix Completed?: No.    Education has been provided regarding the importance of this vaccine. Patient has been advised to call insurance  company to determine out of pocket expense if they have not yet received this vaccine. Advised may also receive vaccine at local pharmacy or Health Dept. Verbalized acceptance and understanding.  Screening Tests Health Maintenance  Topic Date Due   Zoster Vaccines- Shingrix (1 of 2) Never done   TETANUS/TDAP  06/19/2030   Pneumonia Vaccine 27+ Years old  Completed   INFLUENZA VACCINE  Completed   DEXA SCAN  Completed   COVID-19 Vaccine  Completed   HPV VACCINES  Aged Out    Health Maintenance  Health Maintenance Due  Topic Date Due   Zoster Vaccines- Shingrix (1 of 2) Never done    Colorectal cancer screening: No longer required.   Mammogram status: No longer required due to age.  Bone Density status: not recommended  Lung Cancer Screening: (Low Dose CT Chest recommended if Age 42-80 years, 30 pack-year currently smoking OR have quit w/in 15years.) does not qualify.   Lung Cancer Screening Referral: no  Additional Screening:  Hepatitis C Screening: does not qualify; Completed no  Vision Screening: Recommended annual ophthalmology exams for early detection of glaucoma and other disorders of the eye. Is the patient up to date with their annual eye exam?  Yes  Who is the provider or what is the name of the office in which the patient attends annual eye exams? [facility] If pt is not established with a provider, would they like to be referred to a provider to establish care? No .   Dental Screening: Recommended annual dental exams for proper oral hygiene  Community Resource Referral / Chronic Care Management: CRR required this visit?  No   CCM required this visit?  No      Plan:     I have personally reviewed and noted the following in the patient's chart:   Medical and social history Use of alcohol, tobacco or illicit drugs  Current medications and supplements including opioid prescriptions.  Functional ability and status Nutritional status Physical  activity Advanced directives List of other physicians Hospitalizations, surgeries, and ER visits in previous 12 months Vitals Screenings to include cognitive, depression, and falls Referrals and appointments  In addition, I have reviewed and discussed with patient certain preventive protocols, quality metrics, and best practice recommendations. A written personalized care plan for preventive services as well as general preventive health recommendations were provided to patient.     Sheral Flow, LPN   [id number]   Nurse Notes:  Hearing Screening - Comments:: Patient denied any hearing difficulty.   No hearing aids.  Vision Screening - Comments:: Patient wears corrective glasses/contacts.  Eye exam done annually by: [facility]

## 2021-06-14 NOTE — Progress Notes (Signed)
Pt was given B12 injection w/o any complications. 

## 2021-06-14 NOTE — Patient Instructions (Signed)
Jocelyn [name] , Thank you for taking time to come for your Medicare Wellness Visit. I appreciate your ongoing commitment to your health goals. Please review the following plan we discussed and let me know if I can assist you in the future.   Screening recommendations/referrals: Colonoscopy: Not recommended for screening due to age. Mammogram: Not recommended for screening due to age. Bone Density: Not recommended for screening due to age. Recommended yearly ophthalmology/optometry visit for glaucoma screening and checkup Recommended yearly dental visit for hygiene and checkup  Vaccinations: Influenza vaccine: 06/05/2021 Pneumococcal vaccine: 03/18/2017, 06/09/2013 Tdap vaccine: 06/19/2020; due every 10 years Shingles vaccine: 09/20/2010 (Zoster) Covid-19: 12/28/2019, 01/12/2020, 05/18/2021  Advanced directives: Please bring a copy of your health care power of attorney and living will to the office at your convenience.  Conditions/risks identified: Yes; Client understands the importance of follow-up with providers by attending scheduled visits and discussed goals to eat healthier, increase physical activity, exercise the brain, socialize more, get enough sleep and make time for laughter.  Next appointment: Please schedule your next Medicare Wellness Visit with your Nurse Health Advisor in 1 year by calling [phone].   Preventive Care 21 Years and Older, Female Preventive care refers to lifestyle choices and visits with your health care provider that can promote health and wellness. What does preventive care include? A yearly physical exam. This is also called an annual well check. Dental exams once or twice a year. Routine eye exams. Ask your health care provider how often you should have your eyes checked. Personal lifestyle choices, including: Daily care of your teeth and gums. Regular physical activity. Eating a healthy diet. Avoiding tobacco and drug use. Limiting alcohol  use. Practicing safe sex. Taking low-dose aspirin every day. Taking vitamin and mineral supplements as recommended by your health care provider. What happens during an annual well check? The services and screenings done by your health care provider during your annual well check will depend on your age, overall health, lifestyle risk factors, and family history of disease. Counseling  Your health care provider may ask you questions about your: Alcohol use. Tobacco use. Drug use. Emotional well-being. Home and relationship well-being. Sexual activity. Eating habits. History of falls. Memory and ability to understand (cognition). Work and work Statistician. Reproductive health. Screening  You may have the following tests or measurements: Height, weight, and BMI. Blood pressure. Lipid and cholesterol levels. These may be checked every 5 years, or more frequently if you are over 22 years old. Skin check. Lung cancer screening. You may have this screening every year starting at age 24 if you have a 30-pack-year history of smoking and currently smoke or have quit within the past 15 years. Fecal occult blood test (FOBT) of the stool. You may have this test every year starting at age 38. Flexible sigmoidoscopy or colonoscopy. You may have a sigmoidoscopy every 5 years or a colonoscopy every 10 years starting at age 65. Hepatitis C blood test. Hepatitis B blood test. Sexually transmitted disease (STD) testing. Diabetes screening. This is done by checking your blood sugar (glucose) after you have not eaten for a while (fasting). You may have this done every 1-3 years. Bone density scan. This is done to screen for osteoporosis. You may have this done starting at age 9. Mammogram. This may be done every 1-2 years. Talk to your health care provider about how often you should have regular mammograms. Talk with your health care provider about your test results, treatment options, and if necessary,  the need for more tests. Vaccines  Your health care provider may recommend certain vaccines, such as: Influenza vaccine. This is recommended every year. Tetanus, diphtheria, and acellular pertussis (Tdap, Td) vaccine. You may need a Td booster every 10 years. Zoster vaccine. You may need this after age 11. Pneumococcal 13-valent conjugate (PCV13) vaccine. One dose is recommended after age 37. Pneumococcal polysaccharide (PPSV23) vaccine. One dose is recommended after age 71. Talk to your health care provider about which screenings and vaccines you need and how often you need them. This information is not intended to replace advice given to you by your health care provider. Make sure you discuss any questions you have with your health care provider. Document Released: 08/25/2015 Document Revised: 04/17/2016 Document Reviewed: 05/30/2015 Elsevier Interactive Patient Education  2017 [place] Prevention in the Home Falls can cause injuries. They can happen to people of all ages. There are many things you can do to make your home safe and to help prevent falls. What can I do on the outside of my home? Regularly fix the edges of walkways and driveways and fix any cracks. Remove anything that might make you trip as you walk through a door, such as a raised step or threshold. Trim any bushes or trees on the path to your home. Use bright outdoor lighting. Clear any walking paths of anything that might make someone trip, such as rocks or tools. Regularly check to see if handrails are loose or broken. Make sure that both sides of any steps have handrails. Any raised decks and porches should have guardrails on the edges. Have any leaves, snow, or ice cleared regularly. Use sand or salt on walking paths during winter. Clean up any spills in your garage right away. This includes oil or grease spills. What can I do in the bathroom? Use night lights. Install grab bars by the toilet and in the  tub and shower. Do not use towel bars as grab bars. Use non-skid mats or decals in the tub or shower. If you need to sit down in the shower, use a plastic, non-slip stool. Keep the floor dry. Clean up any water that spills on the floor as soon as it happens. Remove soap buildup in the tub or shower regularly. Attach bath mats securely with double-sided non-slip rug tape. Do not have throw rugs and other things on the floor that can make you trip. What can I do in the bedroom? Use night lights. Make sure that you have a light by your bed that is easy to reach. Do not use any sheets or blankets that are too big for your bed. They should not hang down onto the floor. Have a firm chair that has side arms. You can use this for support while you get dressed. Do not have throw rugs and other things on the floor that can make you trip. What can I do in the kitchen? Clean up any spills right away. Avoid walking on wet floors. Keep items that you use a lot in easy-to-reach places. If you need to reach something above you, use a strong step stool that has a grab bar. Keep electrical cords out of the way. Do not use floor polish or wax that makes floors slippery. If you must use wax, use non-skid floor wax. Do not have throw rugs and other things on the floor that can make you trip. What can I do with my stairs? Do not leave any items on the  stairs. Make sure that there are handrails on both sides of the stairs and use them. Fix handrails that are broken or loose. Make sure that handrails are as long as the stairways. Check any carpeting to make sure that it is firmly attached to the stairs. Fix any carpet that is loose or worn. Avoid having throw rugs at the top or bottom of the stairs. If you do have throw rugs, attach them to the floor with carpet tape. Make sure that you have a light switch at the top of the stairs and the bottom of the stairs. If you do not have them, ask someone to add them for  you. What else can I do to help prevent falls? Wear shoes that: Do not have high heels. Have rubber bottoms. Are comfortable and fit you well. Are closed at the toe. Do not wear sandals. If you use a stepladder: Make sure that it is fully opened. Do not climb a closed stepladder. Make sure that both sides of the stepladder are locked into place. Ask someone to hold it for you, if possible. Clearly mark and make sure that you can see: Any grab bars or handrails. First and last steps. Where the edge of each step is. Use tools that help you move around (mobility aids) if they are needed. These include: Canes. Walkers. Scooters. Crutches. Turn on the lights when you go into a dark area. Replace any light bulbs as soon as they burn out. Set up your furniture so you have a clear path. Avoid moving your furniture around. If any of your floors are uneven, fix them. If there are any pets around you, be aware of where they are. Review your medicines with your doctor. Some medicines can make you feel dizzy. This can increase your chance of falling. Ask your doctor what other things that you can do to help prevent falls. This information is not intended to replace advice given to you by your health care provider. Make sure you discuss any questions you have with your health care provider. Document Released: 05/25/2009 Document Revised: 01/04/2016 Document Reviewed: 09/02/2014 Elsevier Interactive Patient Education  2017 [employer].

## 2021-07-12 ENCOUNTER — Other Ambulatory Visit: Payer: Self-pay

## 2021-07-12 ENCOUNTER — Ambulatory Visit (INDEPENDENT_AMBULATORY_CARE_PROVIDER_SITE_OTHER): Payer: Medicare Other

## 2021-07-12 DIAGNOSIS — E538 Deficiency of other specified B group vitamins: Secondary | ICD-10-CM | POA: Diagnosis not present

## 2021-07-12 DIAGNOSIS — G63 Polyneuropathy in diseases classified elsewhere: Secondary | ICD-10-CM | POA: Diagnosis not present

## 2021-07-12 MED ORDER — CYANOCOBALAMIN 1000 MCG/ML IJ SOLN
1000.0000 ug | Freq: Once | INTRAMUSCULAR | Status: AC
  Administered 2021-07-12: 1000 ug via INTRAMUSCULAR

## 2021-07-12 NOTE — Progress Notes (Signed)
Pt was given B12 w/o any complications. 

## 2021-07-17 ENCOUNTER — Other Ambulatory Visit: Payer: Self-pay | Admitting: Internal Medicine

## 2021-07-17 ENCOUNTER — Other Ambulatory Visit: Payer: Self-pay | Admitting: Adult Health

## 2021-07-17 DIAGNOSIS — F322 Major depressive disorder, single episode, severe without psychotic features: Secondary | ICD-10-CM

## 2021-07-17 DIAGNOSIS — M150 Primary generalized (osteo)arthritis: Secondary | ICD-10-CM

## 2021-07-17 DIAGNOSIS — M159 Polyosteoarthritis, unspecified: Secondary | ICD-10-CM

## 2021-07-17 DIAGNOSIS — E785 Hyperlipidemia, unspecified: Secondary | ICD-10-CM

## 2021-07-17 DIAGNOSIS — I63531 Cerebral infarction due to unspecified occlusion or stenosis of right posterior cerebral artery: Secondary | ICD-10-CM

## 2021-07-18 ENCOUNTER — Other Ambulatory Visit: Payer: Self-pay | Admitting: Internal Medicine

## 2021-07-18 DIAGNOSIS — I63531 Cerebral infarction due to unspecified occlusion or stenosis of right posterior cerebral artery: Secondary | ICD-10-CM

## 2021-07-27 ENCOUNTER — Telehealth: Payer: Medicare Other

## 2021-08-16 ENCOUNTER — Other Ambulatory Visit: Payer: Self-pay

## 2021-08-16 ENCOUNTER — Ambulatory Visit (INDEPENDENT_AMBULATORY_CARE_PROVIDER_SITE_OTHER): Payer: Medicare Other

## 2021-08-16 DIAGNOSIS — G63 Polyneuropathy in diseases classified elsewhere: Secondary | ICD-10-CM | POA: Diagnosis not present

## 2021-08-16 DIAGNOSIS — E538 Deficiency of other specified B group vitamins: Secondary | ICD-10-CM

## 2021-08-16 MED ORDER — CYANOCOBALAMIN 1000 MCG/ML IJ SOLN
1000.0000 ug | Freq: Once | INTRAMUSCULAR | Status: AC
  Administered 2021-08-16: 1000 ug via INTRAMUSCULAR

## 2021-08-16 NOTE — Progress Notes (Signed)
Patient here for monthly B12 injection per Dr. Ronnald Ramp.  B12 1000 mcg given in left deltoid IM and patient tolerated injection well today.

## 2021-09-03 NOTE — Progress Notes (Addendum)
Patient here for monthly B12 injection per Dr. Jones.  B12 1000 mcg given in left IM and patient tolerated injection well today.  

## 2021-09-20 ENCOUNTER — Other Ambulatory Visit: Payer: Self-pay

## 2021-09-20 ENCOUNTER — Ambulatory Visit (INDEPENDENT_AMBULATORY_CARE_PROVIDER_SITE_OTHER): Payer: Medicare Other

## 2021-09-20 ENCOUNTER — Telehealth: Payer: Self-pay

## 2021-09-20 ENCOUNTER — Other Ambulatory Visit: Payer: Self-pay | Admitting: Internal Medicine

## 2021-09-20 DIAGNOSIS — G63 Polyneuropathy in diseases classified elsewhere: Secondary | ICD-10-CM | POA: Diagnosis not present

## 2021-09-20 DIAGNOSIS — E538 Deficiency of other specified B group vitamins: Secondary | ICD-10-CM | POA: Diagnosis not present

## 2021-09-20 DIAGNOSIS — Z23 Encounter for immunization: Secondary | ICD-10-CM

## 2021-09-20 MED ORDER — SHINGRIX 50 MCG/0.5ML IM SUSR: 0.5000 mL | Freq: Once | INTRAMUSCULAR | 1 refills | Status: AC

## 2021-09-20 MED ORDER — CYANOCOBALAMIN 1000 MCG/ML IJ SOLN
1000.0000 ug | Freq: Once | INTRAMUSCULAR | Status: AC
  Administered 2021-09-20: 1000 ug via INTRAMUSCULAR

## 2021-09-20 NOTE — Progress Notes (Signed)
Patient here for monthly B12 injection per Dr. Jones.  B12 1000 mcg given in left IM and patient tolerated injection well today.  

## 2021-10-09 NOTE — Telephone Encounter (Signed)
Completed.

## 2021-10-17 ENCOUNTER — Other Ambulatory Visit: Payer: Self-pay | Admitting: Internal Medicine

## 2021-10-17 ENCOUNTER — Other Ambulatory Visit: Payer: Self-pay | Admitting: Adult Health

## 2021-10-17 DIAGNOSIS — F322 Major depressive disorder, single episode, severe without psychotic features: Secondary | ICD-10-CM

## 2021-10-17 DIAGNOSIS — M159 Polyosteoarthritis, unspecified: Secondary | ICD-10-CM

## 2021-10-17 DIAGNOSIS — I63531 Cerebral infarction due to unspecified occlusion or stenosis of right posterior cerebral artery: Secondary | ICD-10-CM

## 2021-10-17 DIAGNOSIS — E785 Hyperlipidemia, unspecified: Secondary | ICD-10-CM

## 2021-10-18 ENCOUNTER — Ambulatory Visit: Payer: Medicare Other

## 2021-10-20 ENCOUNTER — Other Ambulatory Visit: Payer: Self-pay | Admitting: Internal Medicine

## 2021-10-20 DIAGNOSIS — N3281 Overactive bladder: Secondary | ICD-10-CM

## 2021-10-25 ENCOUNTER — Other Ambulatory Visit: Payer: Self-pay

## 2021-10-25 ENCOUNTER — Ambulatory Visit (INDEPENDENT_AMBULATORY_CARE_PROVIDER_SITE_OTHER): Payer: Medicare Other | Admitting: Adult Health

## 2021-10-25 ENCOUNTER — Encounter: Payer: Self-pay | Admitting: Adult Health

## 2021-10-25 ENCOUNTER — Ambulatory Visit: Payer: Medicare Other

## 2021-10-25 VITALS — BP 153/91 | HR 83 | Ht 66.0 in | Wt 139.0 lb

## 2021-10-25 DIAGNOSIS — I63531 Cerebral infarction due to unspecified occlusion or stenosis of right posterior cerebral artery: Secondary | ICD-10-CM | POA: Diagnosis not present

## 2021-10-25 DIAGNOSIS — E538 Deficiency of other specified B group vitamins: Secondary | ICD-10-CM

## 2021-10-25 DIAGNOSIS — I69319 Unspecified symptoms and signs involving cognitive functions following cerebral infarction: Secondary | ICD-10-CM | POA: Diagnosis not present

## 2021-10-25 NOTE — Progress Notes (Signed)
Pt here for monthly B12 injection ? ?B12 1069mg given IM, and pt tolerated injection well. ? ?Next B12 injection scheduled for 11/29/21 ? ?

## 2021-10-25 NOTE — Progress Notes (Signed)
?[facility] ?[address] ?[place]. [place] [address] ?[phone] ? ?     STROKE FOLLOW UP NOTE ? ?[name] ?Date of Birth:  09-23-1931 ?Medical Record Number:  [id number]  ? ?Reason for Referral: stroke follow up ? ? ? ?SUBJECTIVE: ? ? ?CHIEF COMPLAINT:  ?Chief Complaint  ?Patient presents with  ? Follow-up  ?  RM 2 with daughter tammy  ?Pt is well and stable, no new concerns   ? ? ?HPI:  ? ?Update 10/25/2021 JM: 88 year old female with history of stroke and cognitive impairment returns for 27-monthfollow-up accompanied by her daughter, TLynelle Smoke  Stable from stroke standpoint without new stroke/TIA symptoms.  Cognition stable since prior visit.  She continues to live alone as she continues to decline moving to assisted living or living with family although very supportive family who assists as needed and checks on her frequently.  Able to maintain ADLs independently and some IADLs, daughter continues to assist with medications, bill paying and transportation. Per daughter, patient does not take her medications routinely either she forgets or refuses to take.  Sleeping prolonged hours typically 12 to 15 hours per day. Decreased motivation, concern of underlying depression and anxiety.  Currently prescribed duloxetine and Namenda but again, intermittent use. She continues to have falls but thankfully without injury. She will occasionally use her life alert button.  On Plavix and atorvastatin, denies side effects.  Blood pressure today 153/91.  Not routinely monitor at home.  No further concerns at this time.  ? ? ? ? ? ?History provided for reference purposes only ?Update 04/23/2021 JM: Ms. PWamserreturns for 631-monthollow-up regarding history of stroke and cognitive deficit accompanied by daughter. Does have short term memory difficulties.  Continues to live alone with family checking on her frequently but family becoming more concerned about the safety of her continuing to live alone.  She  is resistant to moving into an assisted living or getting additional assistance at home.  Daughter assists with organizing pills in pill box but at times, patient will forget to take medications. Remains on Namenda but daughter questioning benefit as sge believes cognition has been gradually declining.  She will occasionally do word finding puzzles but otherwise relatively sedentary.  Maintains ADLs and majority of IADls - daughter assist with medications and bill paying. Also assists with transpiration as patient does not drive. Continues to use RW outdoors - does not always use in home.  Does report a couple of falls in the home thankfully without injury. Stable from stroke standpoint without new stroke/TIA symptoms.  Remains on Plavix and atorvastatin.  Blood pressure today 124/83.  No further concerns at this time ? ?Update 10/17/2020 JM: Ms. PhGilroyeturns for 4-3-monthheduled follow-up with prior stroke history and cognitive decline.  She is accompanied by her daughter, TamLynelle Smoke?Residual stroke deficits of cognitive impairment and imbalance ?Denies new stroke/TIA symptoms ? ?At prior visit, concern for worsening gait with frequent falls and cognitive decline - MRI w/wo negative for acute abnormalities, EEG negative, dementia panel within normal limits except slightly low B12 level (although still WNL) with known B12 deficiency ?She has not had any additional falls and continues to use rolling walker for assistance -previously using cane ? ?She continues to live independently and maintains ADLs independently ?Daughter assists with majority of IADL's ?Does not currently drive which she is frustrated about ?Per daughter, she has noted a continued decline in cognition specifically with short-term memory ?She remains sedentary watching TV  majority of the day -patient reports activity and exercise during the day but per daughter, this is not accurate ?Per daughter, occasional agitation and increased confusion such as  believing her husband is with her in her home - she denies actually seeing him but "feels his presence" ? ?On Namenda '10mg'$  twice daily tolerating without side effects ?On Cymbalta per PCP for depression/anxiety with noted improvement ? ?MMSE today 23/30 (prior 23/30) ? ?Reports compliance on Plavix and atorvastatin -denies side effects ?Blood pressure today 134/85 ? ?No further concerns at this time ? ?Update 06/19/2020 JM: Ms. Zwiebel returns for stroke follow-up accompanied by her granddaughter.  Granddaughter sent my chart message on 06/18/2020 with concerns of frequent falls and gradual worsening cognition over the past 3 months (see MyChart message 11/7). Per granddaughter, she has had multiple falls with 2 falls over the past month with fall on 10/12 apparently in the middle of the night but did not call her granddaughter until later the following day. Per granddaughter, multiple lacerations including back of her head on the left side and 3 areas in her left arm.  She refused to be evaluated at that time.  She had an additional fall recently thankfully without injury.  She is currently living independently but does have family checking on her frequently and cameras throughout the house but granddaughter has appropriate safety concerns as she is unaware of her physical and cognitive deficits.  Granddaughter attempted to place her in ALF with patient adamantly refused.  She will be agitated or irritable quickly, frequently ask where her husband is (who passed away from Alzheimer's disease) and become more confused in the evening or initially upon awakening in the morning.  Reports sleeping well throughout the night.  She does admit to depression/anxiety with PCP previously starting her on duloxetine 30 mg daily but denies any benefit.  Admits to sedentary lifestyle with majority of her days spent watching TV.  Previously working with therapies but these were discontinued due to lack of participation and  improvement.  Denies focal deficits such as speech difficulty, hemiparesis, visual changes or sensory changes.  She has remained on aspirin and Plavix despite 4-monthrecommendation without side effects.  Remains on atorvastatin 40 mg daily without myalgias.  Blood pressure today 147/90.  No further concerns at this time. ? ?Initial visit 03/01/2020 JM: Ms. PPorteeis being seen for hospital follow-up accompanied by her granddaughter JEliezer Lofts  Residual deficits of mild imbalance and mild cognitive impairment which has been improving.  Currently working with home health therapy which is to be completed after additional 2 sessions.  She was evaluated by OT or SLP.  Previously living independently but currently staying with family due to safety concerns in regards to ambulation, performing ADLs and IADLs and cognitive impairment.  Currently only on Plavix and discontinued aspirin after 3 weeks (per granddaughter, this was advised at hospital discharge).  Denies bleeding or bruising currently or while on DAPT.  Continues on atorvastatin without myalgias.  Blood pressure today 130/70.  No other concerns at this time. ? ?Stroke admission 01/31/2020 ?Personally reviewed all hospital notes and pertinent lab work and imaging ?Ms. MAnastaisa Woodingis a 88y.o. female with history of HTN, HLD  who presented on 01/31/2020 with waxing and waning confusion, left-sided visual loss and R side numbness.  Stroke work-up revealed right PCA infarct in setting of right PCA high-grade stenosis, infarct secondary to large vessel disease source.  CTA head/neck diffuse intracranial stenosis (additional information below).  Recommended  DAPT for 3 months then Plavix alone as previously on aspirin.  HTN stable.  LDL 123 initiate atorvastatin 40 mg daily.  Other stroke risk factors include advanced age, former tobacco use, EtOH use, family history of stroke but no personal history of stroke.  Evaluated by therapy and recommended outpatient PT and  discharged home in stable condition. ? ?Stroke:   R PCA infarct in setting of R PCA high-grade stenosis, infarcts secondary to large vessel disease source ?MRI  Acute and subacute R occipital infarcts. Moderate small

## 2021-10-25 NOTE — Patient Instructions (Signed)
Continue Namenda '10mg'$  twice daily ? ?Very important to take your medications daily as prescribed - you will feel much better if you take your medications routinely!  ? ? ? ? ? ? ? ?Thank you for coming to see [place] at [facility]. I hope we have been able to provide you high quality care today. ? ?You may receive a patient satisfaction survey over the next few weeks. We would appreciate your feedback and comments so that we may continue to improve ourselves and the health of our patients. ? ?

## 2021-11-12 ENCOUNTER — Ambulatory Visit: Payer: Medicare Other | Admitting: Internal Medicine

## 2021-11-29 ENCOUNTER — Ambulatory Visit (INDEPENDENT_AMBULATORY_CARE_PROVIDER_SITE_OTHER): Payer: Medicare Other

## 2021-11-29 DIAGNOSIS — G63 Polyneuropathy in diseases classified elsewhere: Secondary | ICD-10-CM | POA: Diagnosis not present

## 2021-11-29 DIAGNOSIS — E538 Deficiency of other specified B group vitamins: Secondary | ICD-10-CM

## 2021-11-29 MED ORDER — CYANOCOBALAMIN 1000 MCG/ML IJ SOLN
1000.0000 ug | Freq: Once | INTRAMUSCULAR | Status: AC
  Administered 2021-11-29: 1000 ug via INTRAMUSCULAR

## 2021-11-29 NOTE — Progress Notes (Signed)
Patient here for monthly B12 injection per Dr. Jones.  B12 1000 mcg given in right IM and patient tolerated injection well today.  

## 2022-01-03 ENCOUNTER — Ambulatory Visit: Payer: Medicare Other

## 2022-01-11 ENCOUNTER — Telehealth: Payer: Self-pay

## 2022-01-11 NOTE — Chronic Care Management (AMB) (Signed)
    Chronic Care Management Pharmacy Assistant   Name: Jocelyn [name]  MRN: [id number] DOB: 01/27/1932   Reason for Encounter: Disease State-General    Recent office visits:  None in previous 6 months  Recent consult visits:  01/25/22 Frann Rider, NP-Neurology ([facility]) No orders or medication changes  Hospital visits:  None in previous 6 months  Medications: Outpatient Encounter Medications as of 01/11/2022  Medication Sig   atorvastatin (LIPITOR) 40 MG tablet TAKE ONE TABLET BY MOUTH ONE TIME DAILY   clopidogrel (PLAVIX) 75 MG tablet TAKE ONE TABLET BY MOUTH ONE TIME DAILY   collagenase (SANTYL) ointment Apply 1 application topically daily.   Cyanocobalamin (B-12 COMPLIANCE INJECTION IJ) Inject 1 Dose as directed every 30 (thirty) days.   DULoxetine (CYMBALTA) 60 MG capsule TAKE ONE CAPSULE BY MOUTH ONE TIME DAILY   memantine (NAMENDA) 10 MG tablet TAKE ONE TABLET BY MOUTH TWICE DAILY   solifenacin (VESICARE) 10 MG tablet TAKE ONE TABLET BY MOUTH ONE TIME DAILY   white petrolatum (VASELINE) GEL Apply 1 application. topically as needed for lip care.   No facility-administered encounter medications on file as of 01/11/2022.   Reviewed chart for medication changes and drug therapy problems ahead of medication adherence call.  Attempted to contact patient for medication review and health check, unable to reach patient, left voicemails to return call.Sending note to pharmacist to sigh off.    Care Gaps: Colonoscopy-NA Diabetic Foot Exam-NA Mammogram-NA Ophthalmology-NA Dexa Scan - NA Annual Well Visit - Na Micro albumin-NA Hemoglobin A1c- 02/01/20   Star Rating Drugs: Atorvastatin 40 mg Last filled:10/17/21 90 ds   Ethelene Hal Clinical Pharmacist Assistant [phone]

## 2022-01-17 ENCOUNTER — Ambulatory Visit (INDEPENDENT_AMBULATORY_CARE_PROVIDER_SITE_OTHER): Payer: Medicare Other

## 2022-01-17 DIAGNOSIS — E538 Deficiency of other specified B group vitamins: Secondary | ICD-10-CM

## 2022-01-17 MED ORDER — CYANOCOBALAMIN 1000 MCG/ML IJ SOLN
1000.0000 ug | Freq: Once | INTRAMUSCULAR | Status: AC
  Administered 2022-01-17: 1000 ug via INTRAMUSCULAR

## 2022-01-17 NOTE — Progress Notes (Signed)
B12 given and tolerated well °

## 2022-02-11 ENCOUNTER — Telehealth: Payer: Medicare Other

## 2022-02-13 ENCOUNTER — Encounter: Payer: Self-pay | Admitting: Internal Medicine

## 2022-02-21 ENCOUNTER — Ambulatory Visit (INDEPENDENT_AMBULATORY_CARE_PROVIDER_SITE_OTHER): Payer: Medicare Other

## 2022-02-21 DIAGNOSIS — E538 Deficiency of other specified B group vitamins: Secondary | ICD-10-CM

## 2022-02-21 MED ORDER — CYANOCOBALAMIN 1000 MCG/ML IJ SOLN
1000.0000 ug | Freq: Once | INTRAMUSCULAR | Status: AC
  Administered 2022-02-21: 1000 ug via INTRAMUSCULAR

## 2022-02-21 NOTE — Progress Notes (Signed)
After obtaining consent, and per orders of Dr. Ronnald Ramp, injection of B12 was given on the left deltoid by Marrian Salvage. Patient tolerated well, instructed to report any adverse reaction to me immediately.

## 2022-03-04 ENCOUNTER — Encounter: Payer: Self-pay | Admitting: Internal Medicine

## 2022-03-06 DIAGNOSIS — Z111 Encounter for screening for respiratory tuberculosis: Secondary | ICD-10-CM | POA: Diagnosis not present

## 2022-03-08 DIAGNOSIS — Z681 Body mass index (BMI) 19 or less, adult: Secondary | ICD-10-CM | POA: Diagnosis not present

## 2022-03-08 DIAGNOSIS — Z111 Encounter for screening for respiratory tuberculosis: Secondary | ICD-10-CM | POA: Diagnosis not present

## 2022-03-11 ENCOUNTER — Ambulatory Visit (INDEPENDENT_AMBULATORY_CARE_PROVIDER_SITE_OTHER): Payer: Medicare Other | Admitting: Internal Medicine

## 2022-03-11 ENCOUNTER — Encounter: Payer: Self-pay | Admitting: Internal Medicine

## 2022-03-11 VITALS — BP 130/74 | HR 86 | Temp 98.0°F | Ht 66.0 in | Wt 138.0 lb

## 2022-03-11 DIAGNOSIS — D539 Nutritional anemia, unspecified: Secondary | ICD-10-CM

## 2022-03-11 DIAGNOSIS — E538 Deficiency of other specified B group vitamins: Secondary | ICD-10-CM

## 2022-03-11 DIAGNOSIS — F411 Generalized anxiety disorder: Secondary | ICD-10-CM | POA: Diagnosis not present

## 2022-03-11 DIAGNOSIS — R42 Dizziness and giddiness: Secondary | ICD-10-CM

## 2022-03-11 DIAGNOSIS — N1832 Chronic kidney disease, stage 3b: Secondary | ICD-10-CM | POA: Diagnosis not present

## 2022-03-11 DIAGNOSIS — I10 Essential (primary) hypertension: Secondary | ICD-10-CM

## 2022-03-11 DIAGNOSIS — Z23 Encounter for immunization: Secondary | ICD-10-CM | POA: Insufficient documentation

## 2022-03-11 DIAGNOSIS — I69398 Other sequelae of cerebral infarction: Secondary | ICD-10-CM | POA: Diagnosis not present

## 2022-03-11 DIAGNOSIS — E785 Hyperlipidemia, unspecified: Secondary | ICD-10-CM

## 2022-03-11 DIAGNOSIS — G63 Polyneuropathy in diseases classified elsewhere: Secondary | ICD-10-CM | POA: Diagnosis not present

## 2022-03-11 DIAGNOSIS — Z0289 Encounter for other administrative examinations: Secondary | ICD-10-CM

## 2022-03-11 LAB — BASIC METABOLIC PANEL
BUN: 14 mg/dL (ref 6–23)
CO2: 27 mEq/L (ref 19–32)
Calcium: 9.9 mg/dL (ref 8.4–10.5)
Chloride: 106 mEq/L (ref 96–112)
Creatinine, Ser: 1.15 mg/dL (ref 0.40–1.20)
GFR: 42.78 mL/min — ABNORMAL LOW (ref >60.00)
Glucose, Bld: 86 mg/dL (ref 70–99)
Potassium: 4.1 mEq/L (ref 3.5–5.1)
Sodium: 141 mEq/L (ref 135–145)

## 2022-03-11 LAB — LIPID PANEL
Cholesterol: 126 mg/dL (ref 0–200)
HDL: 52.1 mg/dL (ref >39.00)
LDL Cholesterol: 55 mg/dL (ref 0–99)
NonHDL: 73.67
Total CHOL/HDL Ratio: 2
Triglycerides: 95 mg/dL (ref 0.0–149.0)
VLDL: 19 mg/dL (ref 0.0–40.0)

## 2022-03-11 LAB — CBC WITH DIFFERENTIAL/PLATELET
Basophils Absolute: 0.1 10*3/uL (ref 0.0–0.1)
Basophils Relative: 1 % (ref 0.0–3.0)
Eosinophils Absolute: 0.3 10*3/uL (ref 0.0–0.7)
Eosinophils Relative: 6 % — ABNORMAL HIGH (ref 0.0–5.0)
HCT: 38.5 % (ref 36.0–46.0)
Hemoglobin: 13 g/dL (ref 12.0–15.0)
Lymphocytes Relative: 20.4 % (ref 12.0–46.0)
Lymphs Abs: 1.1 10*3/uL (ref 0.7–4.0)
MCHC: 33.8 g/dL (ref 30.0–36.0)
MCV: 91.2 fl (ref 78.0–100.0)
Monocytes Absolute: 0.5 10*3/uL (ref 0.1–1.0)
Monocytes Relative: 8.7 % (ref 3.0–12.0)
Neutro Abs: 3.6 10*3/uL (ref 1.4–7.7)
Neutrophils Relative %: 63.9 % (ref 43.0–77.0)
Platelets: 234 10*3/uL (ref 150.0–400.0)
RBC: 4.22 Mil/uL (ref 3.87–5.11)
RDW: 13.5 % (ref 11.5–15.5)
WBC: 5.6 10*3/uL (ref 4.0–10.5)

## 2022-03-11 LAB — IBC + FERRITIN
Ferritin: 146.6 ng/mL (ref 10.0–291.0)
Iron: 109 ug/dL (ref 42–145)
Saturation Ratios: 39.1 % (ref 20.0–50.0)
TIBC: 278.6 ug/dL (ref 250.0–450.0)
Transferrin: 199 mg/dL — ABNORMAL LOW (ref 212.0–360.0)

## 2022-03-11 LAB — HEPATIC FUNCTION PANEL
ALT: 11 U/L (ref 0–35)
AST: 13 U/L (ref 0–37)
Albumin: 4 g/dL (ref 3.5–5.2)
Alkaline Phosphatase: 70 U/L (ref 39–117)
Bilirubin, Direct: 0.2 mg/dL (ref 0.0–0.3)
Total Bilirubin: 0.9 mg/dL (ref 0.2–1.2)
Total Protein: 6.1 g/dL (ref 6.0–8.3)

## 2022-03-11 LAB — FOLATE: Folate: 7.9 ng/mL (ref >5.9)

## 2022-03-11 LAB — TSH: TSH: 1.63 u[IU]/mL (ref 0.35–5.50)

## 2022-03-11 MED ORDER — DIAZEPAM 2 MG PO TABS: 2.0000 mg | ORAL_TABLET | Freq: Three times a day (TID) | ORAL | 3 refills | Status: DC | PRN

## 2022-03-11 MED ORDER — SHINGRIX 50 MCG/0.5ML IM SUSR: 0.5000 mL | Freq: Once | INTRAMUSCULAR | 1 refills | Status: AC

## 2022-03-11 NOTE — Progress Notes (Signed)
Subjective:  Patient ID: Jocelyn [name], female    DOB: [birth year]  Age: 88 y.o. MRN: [id number]  CC: Hypertension, Hyperlipidemia, and Anemia   HPI Jocelyn [name] presents for f/up -  She complains of anxiety, dizziness, and vertigo.  Outpatient Medications Prior to Visit  Medication Sig Dispense Refill   atorvastatin (LIPITOR) 40 MG tablet TAKE ONE TABLET BY MOUTH ONE TIME DAILY 90 tablet 0   clopidogrel (PLAVIX) 75 MG tablet TAKE ONE TABLET BY MOUTH ONE TIME DAILY 90 tablet 0   collagenase (SANTYL) ointment Apply 1 application topically daily. 15 g 0   Cyanocobalamin (B-12 COMPLIANCE INJECTION IJ) Inject 1 Dose as directed every 30 (thirty) days.     DULoxetine (CYMBALTA) 60 MG capsule TAKE ONE CAPSULE BY MOUTH ONE TIME DAILY 90 capsule 0   memantine (NAMENDA) 10 MG tablet TAKE ONE TABLET BY MOUTH TWICE DAILY 180 tablet 0   solifenacin (VESICARE) 10 MG tablet TAKE ONE TABLET BY MOUTH ONE TIME DAILY 90 tablet 1   white petrolatum (VASELINE) GEL Apply 1 application. topically as needed for lip care.     No facility-administered medications prior to visit.    ROS Review of Systems  Constitutional:  Negative for diaphoresis and fatigue.  HENT: Negative.    Eyes: Negative.   Respiratory:  Positive for shortness of breath. Negative for cough, chest tightness and wheezing.   Cardiovascular:  Negative for chest pain, palpitations and leg swelling.  Gastrointestinal:  Negative for abdominal pain, constipation, diarrhea, nausea and vomiting.  Endocrine: Negative.   Genitourinary: Negative.  Negative for difficulty urinating.  Musculoskeletal: Negative.  Negative for arthralgias and myalgias.  Skin: Negative.   Neurological:  Negative for dizziness and weakness.  Hematological:  Negative for adenopathy. Does not bruise/bleed easily.  Psychiatric/Behavioral: Negative.      Objective:  BP 130/74 (BP Location: Left Arm, Patient Position: Sitting, Cuff Size: Large)   Pulse 86   Temp 98  F (36.7 C) (Oral)   Ht '5\' 6"'$  (1.676 m)   Wt 138 lb (62.6 kg)   SpO2 93%   BMI 22.27 kg/m   BP Readings from Last 3 Encounters:  03/11/22 130/74  10/25/21 (!) 153/91  06/14/21 122/70    Wt Readings from Last 3 Encounters:  03/11/22 138 lb (62.6 kg)  10/25/21 139 lb (63 kg)  06/14/21 132 lb 12.8 oz (60.2 kg)    Physical Exam Vitals reviewed.  HENT:     Mouth/Throat:     Mouth: Mucous membranes are moist.  Eyes:     General: No scleral icterus.    Pupils: Pupils are equal, round, and reactive to light.  Cardiovascular:     Rate and Rhythm: Normal rate and regular rhythm.     Heart sounds: No murmur heard. Pulmonary:     Effort: Pulmonary effort is normal.     Breath sounds: No stridor. No wheezing, rhonchi or rales.  Abdominal:     General: Abdomen is flat.     Palpations: There is no mass.     Tenderness: There is no abdominal tenderness. There is no guarding.     Hernia: No hernia is present.  Musculoskeletal:        General: Normal range of motion.     Cervical back: Neck supple.     Right lower leg: No edema.     Left lower leg: No edema.  Lymphadenopathy:     Cervical: No cervical adenopathy.  Skin:    General: Skin is  warm and dry.  Neurological:     General: No focal deficit present.     Mental Status: She is alert.  Psychiatric:        Mood and Affect: Mood normal.        Behavior: Behavior normal.     Lab Results  Component Value Date   WBC 5.6 03/11/2022   HGB 13.0 03/11/2022   HCT 38.5 03/11/2022   PLT 234.0 03/11/2022   GLUCOSE 86 03/11/2022   CHOL 126 03/11/2022   TRIG 95.0 03/11/2022   HDL 52.10 03/11/2022   LDLDIRECT 128.9 04/08/2011   LDLCALC 55 03/11/2022   ALT 11 03/11/2022   AST 13 03/11/2022   NA 141 03/11/2022   K 4.1 03/11/2022   CL 106 03/11/2022   CREATININE 1.15 03/11/2022   BUN 14 03/11/2022   CO2 27 03/11/2022   TSH 1.63 03/11/2022   INR 1.0 01/31/2020   HGBA1C 4.7 (L) 02/01/2020    DG Tibia/Fibula  Right  Result Date: 06/29/2020 CLINICAL DATA:  Wound infection. EXAM: RIGHT TIBIA AND FIBULA - 2 VIEW COMPARISON:  06/22/2020 FINDINGS: No fracture or bone lesion. No bone resorption to suggest osteomyelitis. Knee and ankle joints are normally aligned. Soft tissue wound noted anterior to the lower tibial shaft. No soft tissue air. IMPRESSION: 1. No fracture, bone lesion or evidence of osteomyelitis. Electronically Signed   By: Lajean Manes M.D.   On: 06/29/2020 10:59    Assessment & Plan:   Jocelyn [name] was seen today for hypertension, hyperlipidemia and anemia.  Diagnoses and all orders for this visit:  Vitamin B12 deficiency neuropathy ([place]) -     CBC with Differential/Platelet; Future -     Folate; Future -     Folate -     CBC with Differential/Platelet  Essential hypertension- Her BP is well controlled. -     Basic metabolic panel; Future -     TSH; Future -     Hepatic function panel; Future -     Hepatic function panel -     TSH -     Basic metabolic panel  Stage 3b chronic kidney disease (HCC) -     Basic metabolic panel; Future -     Urinalysis, Routine w reflex microscopic; Future -     Urinalysis, Routine w reflex microscopic -     Basic metabolic panel  Dyslipidemia, goal LDL below 130- LDL goal achieved. Doing well on the statin  -     Lipid panel; Future -     TSH; Future -     Hepatic function panel; Future -     Hepatic function panel -     TSH -     Lipid panel  Deficiency anemia- Her H&H are normal now.  Will screen for vitamin deficiencies. -     CBC with Differential/Platelet; Future -     IBC + Ferritin; Future -     Folate; Future -     Folate -     IBC + Ferritin -     CBC with Differential/Platelet  Need for prophylactic vaccination and inoculation against varicella -     Zoster Vaccine Adjuvanted [facility]) injection; Inject 0.5 mLs into the muscle once for 1 dose.  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  GAD (generalized anxiety disorder) -  diazepam (VALIUM) 2 MG tablet; Take 1 tablet (2 mg total) by mouth every 8 (eight) hours as needed for anxiety.   I am having Jocelyn [name] start on Shingrix and diazepam. I am also having her maintain her Cyanocobalamin (B-12 COMPLIANCE INJECTION IJ), collagenase, white petrolatum, memantine, atorvastatin, clopidogrel, DULoxetine, and solifenacin.  Meds ordered this encounter  Medications   Zoster Vaccine Adjuvanted [facility]) injection    Sig: Inject 0.5 mLs into the muscle once for 1 dose.    Dispense:  0.5 mL    Refill:  1   diazepam (VALIUM) 2 MG tablet    Sig: Take 1 tablet (2 mg total) by mouth every 8 (eight) hours as needed for anxiety.    Dispense:  90 tablet    Refill:  3     Follow-up: Return in about 6 months (around 09/11/2022).  Scarlette Calico, MD

## 2022-03-11 NOTE — Patient Instructions (Signed)
Hypertension, Adult High blood pressure (hypertension) is when the force of blood pumping through the arteries is too strong. The arteries are the blood vessels that carry blood from the heart throughout the body. Hypertension forces the heart to work harder to pump blood and may cause arteries to become narrow or stiff. Untreated or uncontrolled hypertension can lead to a heart attack, heart failure, a stroke, kidney disease, and other problems. A blood pressure reading consists of a higher number over a lower number. Ideally, your blood pressure should be below 120/80. The first ("top") number is called the systolic pressure. It is a measure of the pressure in your arteries as your heart beats. The second ("bottom") number is called the diastolic pressure. It is a measure of the pressure in your arteries as the heart relaxes. What are the causes? The exact cause of this condition is not known. There are some conditions that result in high blood pressure. What increases the risk? Certain factors may make you more likely to develop high blood pressure. Some of these risk factors are under your control, including: Smoking. Not getting enough exercise or physical activity. Being overweight. Having too much fat, sugar, calories, or salt (sodium) in your diet. Drinking too much alcohol. Other risk factors include: Having a personal history of heart disease, diabetes, high cholesterol, or kidney disease. Stress. Having a family history of high blood pressure and high cholesterol. Having obstructive sleep apnea. Age. The risk increases with age. What are the signs or symptoms? High blood pressure may not cause symptoms. Very high blood pressure (hypertensive crisis) may cause: Headache. Fast or irregular heartbeats (palpitations). Shortness of breath. Nosebleed. Nausea and vomiting. Vision changes. Severe chest pain, dizziness, and seizures. How is this diagnosed? This condition is diagnosed by  measuring your blood pressure while you are seated, with your arm resting on a flat surface, your legs uncrossed, and your feet flat on the floor. The cuff of the blood pressure monitor will be placed directly against the skin of your upper arm at the level of your heart. Blood pressure should be measured at least twice using the same arm. Certain conditions can cause a difference in blood pressure between your right and left arms. If you have a high blood pressure reading during one visit or you have normal blood pressure with other risk factors, you may be asked to: Return on a different day to have your blood pressure checked again. Monitor your blood pressure at home for 1 week or longer. If you are diagnosed with hypertension, you may have other blood or imaging tests to help your health care provider understand your overall risk for other conditions. How is this treated? This condition is treated by making healthy lifestyle changes, such as eating healthy foods, exercising more, and reducing your alcohol intake. You may be referred for counseling on a healthy diet and physical activity. Your health care provider may prescribe medicine if lifestyle changes are not enough to get your blood pressure under control and if: Your systolic blood pressure is above 130. Your diastolic blood pressure is above 80. Your personal target blood pressure may vary depending on your medical conditions, your age, and other factors. Follow these instructions at home: Eating and drinking  Eat a diet that is high in fiber and potassium, and low in sodium, added sugar, and fat. An example of this eating plan is called the DASH diet. DASH stands for Dietary Approaches to Stop Hypertension. To eat this way: Eat   plenty of fresh fruits and vegetables. Try to fill one half of your plate at each meal with fruits and vegetables. Eat whole grains, such as whole-wheat pasta, brown rice, or whole-grain bread. Fill about one  fourth of your plate with whole grains. Eat or drink low-fat dairy products, such as skim milk or low-fat yogurt. Avoid fatty cuts of meat, processed or cured meats, and poultry with skin. Fill about one fourth of your plate with lean proteins, such as fish, chicken without skin, beans, eggs, or tofu. Avoid pre-made and processed foods. These tend to be higher in sodium, added sugar, and fat. Reduce your daily sodium intake. Many people with hypertension should eat less than 1,500 mg of sodium a day. Do not drink alcohol if: Your health care provider tells you not to drink. You are pregnant, may be pregnant, or are planning to become pregnant. If you drink alcohol: Limit how much you have to: 0-1 drink a day for women. 0-2 drinks a day for men. Know how much alcohol is in your drink. In the [place] one drink equals one 12 oz bottle of beer (355 mL), one 5 oz glass of wine (148 mL), or one 1 oz glass of hard liquor (44 mL). Lifestyle  Work with your health care provider to maintain a healthy body weight or to lose weight. Ask what an ideal weight is for you. Get at least 30 minutes of exercise that causes your heart to beat faster (aerobic exercise) most days of the week. Activities may include walking, swimming, or biking. Include exercise to strengthen your muscles (resistance exercise), such as Pilates or lifting weights, as part of your weekly exercise routine. Try to do these types of exercises for 30 minutes at least 3 days a week. Do not use any products that contain nicotine or tobacco. These products include cigarettes, chewing tobacco, and vaping devices, such as e-cigarettes. If you need help quitting, ask your health care provider. Monitor your blood pressure at home as told by your health care provider. Keep all follow-up visits. This is important. Medicines Take over-the-counter and prescription medicines only as told by your health care provider. Follow directions carefully. Blood  pressure medicines must be taken as prescribed. Do not skip doses of blood pressure medicine. Doing this puts you at risk for problems and can make the medicine less effective. Ask your health care provider about side effects or reactions to medicines that you should watch for. Contact a health care provider if you: Think you are having a reaction to a medicine you are taking. Have headaches that keep coming back (recurring). Feel dizzy. Have swelling in your ankles. Have trouble with your vision. Get help right away if you: Develop a severe headache or confusion. Have unusual weakness or numbness. Feel faint. Have severe pain in your chest or abdomen. Vomit repeatedly. Have trouble breathing. These symptoms may be an emergency. Get help right away. Call 911. Do not wait to see if the symptoms will go away. Do not drive yourself to the hospital. Summary Hypertension is when the force of blood pumping through your arteries is too strong. If this condition is not controlled, it may put you at risk for serious complications. Your personal target blood pressure may vary depending on your medical conditions, your age, and other factors. For most people, a normal blood pressure is less than 120/80. Hypertension is treated with lifestyle changes, medicines, or a combination of both. Lifestyle changes include losing weight, eating a healthy,   low-sodium diet, exercising more, and limiting alcohol. This information is not intended to replace advice given to you by your health care provider. Make sure you discuss any questions you have with your health care provider. Document Revised: 06/05/2021 Document Reviewed: 06/05/2021 Elsevier Patient Education  2023 Elsevier Inc.  

## 2022-03-15 ENCOUNTER — Telehealth: Payer: Medicare Other

## 2022-03-19 DIAGNOSIS — D510 Vitamin B12 deficiency anemia due to intrinsic factor deficiency: Secondary | ICD-10-CM | POA: Diagnosis not present

## 2022-03-19 DIAGNOSIS — I129 Hypertensive chronic kidney disease with stage 1 through stage 4 chronic kidney disease, or unspecified chronic kidney disease: Secondary | ICD-10-CM | POA: Diagnosis not present

## 2022-03-19 DIAGNOSIS — F0390 Unspecified dementia without behavioral disturbance: Secondary | ICD-10-CM | POA: Diagnosis not present

## 2022-03-19 DIAGNOSIS — M1990 Unspecified osteoarthritis, unspecified site: Secondary | ICD-10-CM | POA: Diagnosis not present

## 2022-03-19 DIAGNOSIS — N189 Chronic kidney disease, unspecified: Secondary | ICD-10-CM | POA: Diagnosis not present

## 2022-03-19 DIAGNOSIS — E785 Hyperlipidemia, unspecified: Secondary | ICD-10-CM | POA: Diagnosis not present

## 2022-04-03 DIAGNOSIS — Y998 Other external cause status: Secondary | ICD-10-CM | POA: Diagnosis not present

## 2022-04-03 DIAGNOSIS — I498 Other specified cardiac arrhythmias: Secondary | ICD-10-CM | POA: Diagnosis not present

## 2022-04-03 DIAGNOSIS — I10 Essential (primary) hypertension: Secondary | ICD-10-CM | POA: Diagnosis not present

## 2022-04-03 DIAGNOSIS — S0003XA Contusion of scalp, initial encounter: Secondary | ICD-10-CM | POA: Diagnosis not present

## 2022-04-03 DIAGNOSIS — S199XXA Unspecified injury of neck, initial encounter: Secondary | ICD-10-CM | POA: Diagnosis not present

## 2022-04-03 DIAGNOSIS — W01198A Fall on same level from slipping, tripping and stumbling with subsequent striking against other object, initial encounter: Secondary | ICD-10-CM | POA: Diagnosis not present

## 2022-04-03 DIAGNOSIS — W19XXXA Unspecified fall, initial encounter: Secondary | ICD-10-CM | POA: Diagnosis not present

## 2022-04-03 DIAGNOSIS — I6529 Occlusion and stenosis of unspecified carotid artery: Secondary | ICD-10-CM | POA: Diagnosis not present

## 2022-04-03 DIAGNOSIS — S0990XA Unspecified injury of head, initial encounter: Secondary | ICD-10-CM | POA: Diagnosis not present

## 2022-04-03 DIAGNOSIS — R42 Dizziness and giddiness: Secondary | ICD-10-CM | POA: Diagnosis not present

## 2022-04-03 DIAGNOSIS — R58 Hemorrhage, not elsewhere classified: Secondary | ICD-10-CM | POA: Diagnosis not present

## 2022-04-03 DIAGNOSIS — Z23 Encounter for immunization: Secondary | ICD-10-CM | POA: Diagnosis not present

## 2022-04-03 DIAGNOSIS — S0101XA Laceration without foreign body of scalp, initial encounter: Secondary | ICD-10-CM | POA: Diagnosis not present

## 2022-04-04 ENCOUNTER — Ambulatory Visit: Payer: Medicare Other

## 2022-04-07 DIAGNOSIS — I498 Other specified cardiac arrhythmias: Secondary | ICD-10-CM | POA: Diagnosis not present

## 2022-04-09 DIAGNOSIS — W19XXXA Unspecified fall, initial encounter: Secondary | ICD-10-CM | POA: Diagnosis not present

## 2022-04-09 DIAGNOSIS — I129 Hypertensive chronic kidney disease with stage 1 through stage 4 chronic kidney disease, or unspecified chronic kidney disease: Secondary | ICD-10-CM | POA: Diagnosis not present

## 2022-04-09 DIAGNOSIS — F0390 Unspecified dementia without behavioral disturbance: Secondary | ICD-10-CM | POA: Diagnosis not present

## 2022-04-09 DIAGNOSIS — S0102XD Laceration with foreign body of scalp, subsequent encounter: Secondary | ICD-10-CM | POA: Diagnosis not present

## 2022-04-15 DIAGNOSIS — Z7901 Long term (current) use of anticoagulants: Secondary | ICD-10-CM | POA: Diagnosis not present

## 2022-04-15 DIAGNOSIS — S51811A Laceration without foreign body of right forearm, initial encounter: Secondary | ICD-10-CM | POA: Diagnosis not present

## 2022-04-15 DIAGNOSIS — M7989 Other specified soft tissue disorders: Secondary | ICD-10-CM | POA: Diagnosis not present

## 2022-04-15 DIAGNOSIS — I639 Cerebral infarction, unspecified: Secondary | ICD-10-CM | POA: Diagnosis not present

## 2022-04-15 DIAGNOSIS — G319 Degenerative disease of nervous system, unspecified: Secondary | ICD-10-CM | POA: Diagnosis not present

## 2022-04-15 DIAGNOSIS — Z4802 Encounter for removal of sutures: Secondary | ICD-10-CM | POA: Diagnosis not present

## 2022-04-15 DIAGNOSIS — S01311A Laceration without foreign body of right ear, initial encounter: Secondary | ICD-10-CM | POA: Diagnosis not present

## 2022-04-15 DIAGNOSIS — I6789 Other cerebrovascular disease: Secondary | ICD-10-CM | POA: Diagnosis not present

## 2022-04-15 DIAGNOSIS — S8001XA Contusion of right knee, initial encounter: Secondary | ICD-10-CM | POA: Diagnosis not present

## 2022-04-15 DIAGNOSIS — S60222A Contusion of left hand, initial encounter: Secondary | ICD-10-CM | POA: Diagnosis not present

## 2022-04-15 DIAGNOSIS — W010XXA Fall on same level from slipping, tripping and stumbling without subsequent striking against object, initial encounter: Secondary | ICD-10-CM | POA: Diagnosis not present

## 2022-04-15 DIAGNOSIS — I129 Hypertensive chronic kidney disease with stage 1 through stage 4 chronic kidney disease, or unspecified chronic kidney disease: Secondary | ICD-10-CM | POA: Diagnosis not present

## 2022-04-15 DIAGNOSIS — N189 Chronic kidney disease, unspecified: Secondary | ICD-10-CM | POA: Diagnosis not present

## 2022-04-15 DIAGNOSIS — Y998 Other external cause status: Secondary | ICD-10-CM | POA: Diagnosis not present

## 2022-04-16 DIAGNOSIS — I129 Hypertensive chronic kidney disease with stage 1 through stage 4 chronic kidney disease, or unspecified chronic kidney disease: Secondary | ICD-10-CM | POA: Diagnosis not present

## 2022-04-16 DIAGNOSIS — N189 Chronic kidney disease, unspecified: Secondary | ICD-10-CM | POA: Diagnosis not present

## 2022-04-16 DIAGNOSIS — N3000 Acute cystitis without hematuria: Secondary | ICD-10-CM | POA: Diagnosis not present

## 2022-04-16 DIAGNOSIS — W19XXXA Unspecified fall, initial encounter: Secondary | ICD-10-CM | POA: Diagnosis not present

## 2022-04-16 DIAGNOSIS — S41111A Laceration without foreign body of right upper arm, initial encounter: Secondary | ICD-10-CM | POA: Diagnosis not present

## 2022-04-16 DIAGNOSIS — M1990 Unspecified osteoarthritis, unspecified site: Secondary | ICD-10-CM | POA: Diagnosis not present

## 2022-04-16 DIAGNOSIS — H814 Vertigo of central origin: Secondary | ICD-10-CM | POA: Diagnosis not present

## 2022-04-16 DIAGNOSIS — S01311A Laceration without foreign body of right ear, initial encounter: Secondary | ICD-10-CM | POA: Diagnosis not present

## 2022-04-17 DIAGNOSIS — N182 Chronic kidney disease, stage 2 (mild): Secondary | ICD-10-CM | POA: Diagnosis not present

## 2022-04-17 DIAGNOSIS — D519 Vitamin B12 deficiency anemia, unspecified: Secondary | ICD-10-CM | POA: Diagnosis not present

## 2022-04-17 DIAGNOSIS — N189 Chronic kidney disease, unspecified: Secondary | ICD-10-CM | POA: Diagnosis not present

## 2022-04-17 DIAGNOSIS — N3281 Overactive bladder: Secondary | ICD-10-CM | POA: Diagnosis not present

## 2022-04-17 DIAGNOSIS — F0390 Unspecified dementia without behavioral disturbance: Secondary | ICD-10-CM | POA: Diagnosis not present

## 2022-04-17 DIAGNOSIS — M159 Polyosteoarthritis, unspecified: Secondary | ICD-10-CM | POA: Diagnosis not present

## 2022-04-17 DIAGNOSIS — Z9181 History of falling: Secondary | ICD-10-CM | POA: Diagnosis not present

## 2022-04-17 DIAGNOSIS — S0101XD Laceration without foreign body of scalp, subsequent encounter: Secondary | ICD-10-CM | POA: Diagnosis not present

## 2022-04-17 DIAGNOSIS — I700 Atherosclerosis of aorta: Secondary | ICD-10-CM | POA: Diagnosis not present

## 2022-04-17 DIAGNOSIS — Z79899 Other long term (current) drug therapy: Secondary | ICD-10-CM | POA: Diagnosis not present

## 2022-04-17 DIAGNOSIS — E785 Hyperlipidemia, unspecified: Secondary | ICD-10-CM | POA: Diagnosis not present

## 2022-04-17 DIAGNOSIS — I129 Hypertensive chronic kidney disease with stage 1 through stage 4 chronic kidney disease, or unspecified chronic kidney disease: Secondary | ICD-10-CM | POA: Diagnosis not present

## 2022-04-17 DIAGNOSIS — F0393 Unspecified dementia, unspecified severity, with mood disturbance: Secondary | ICD-10-CM | POA: Diagnosis not present

## 2022-04-17 DIAGNOSIS — F322 Major depressive disorder, single episode, severe without psychotic features: Secondary | ICD-10-CM | POA: Diagnosis not present

## 2022-04-18 DIAGNOSIS — N3281 Overactive bladder: Secondary | ICD-10-CM | POA: Diagnosis not present

## 2022-04-18 DIAGNOSIS — I129 Hypertensive chronic kidney disease with stage 1 through stage 4 chronic kidney disease, or unspecified chronic kidney disease: Secondary | ICD-10-CM | POA: Diagnosis not present

## 2022-04-18 DIAGNOSIS — S0101XD Laceration without foreign body of scalp, subsequent encounter: Secondary | ICD-10-CM | POA: Diagnosis not present

## 2022-04-18 DIAGNOSIS — F0393 Unspecified dementia, unspecified severity, with mood disturbance: Secondary | ICD-10-CM | POA: Diagnosis not present

## 2022-04-18 DIAGNOSIS — F322 Major depressive disorder, single episode, severe without psychotic features: Secondary | ICD-10-CM | POA: Diagnosis not present

## 2022-04-18 DIAGNOSIS — N182 Chronic kidney disease, stage 2 (mild): Secondary | ICD-10-CM | POA: Diagnosis not present

## 2022-04-22 DIAGNOSIS — E559 Vitamin D deficiency, unspecified: Secondary | ICD-10-CM | POA: Diagnosis not present

## 2022-04-22 DIAGNOSIS — F322 Major depressive disorder, single episode, severe without psychotic features: Secondary | ICD-10-CM | POA: Diagnosis not present

## 2022-04-22 DIAGNOSIS — I129 Hypertensive chronic kidney disease with stage 1 through stage 4 chronic kidney disease, or unspecified chronic kidney disease: Secondary | ICD-10-CM | POA: Diagnosis not present

## 2022-04-22 DIAGNOSIS — N3281 Overactive bladder: Secondary | ICD-10-CM | POA: Diagnosis not present

## 2022-04-22 DIAGNOSIS — S0101XD Laceration without foreign body of scalp, subsequent encounter: Secondary | ICD-10-CM | POA: Diagnosis not present

## 2022-04-22 DIAGNOSIS — F0280 Dementia in other diseases classified elsewhere without behavioral disturbance: Secondary | ICD-10-CM | POA: Diagnosis not present

## 2022-04-22 DIAGNOSIS — G301 Alzheimer's disease with late onset: Secondary | ICD-10-CM | POA: Diagnosis not present

## 2022-04-22 DIAGNOSIS — S01311A Laceration without foreign body of right ear, initial encounter: Secondary | ICD-10-CM | POA: Diagnosis not present

## 2022-04-22 DIAGNOSIS — N182 Chronic kidney disease, stage 2 (mild): Secondary | ICD-10-CM | POA: Diagnosis not present

## 2022-04-22 DIAGNOSIS — Z9181 History of falling: Secondary | ICD-10-CM | POA: Diagnosis not present

## 2022-04-22 DIAGNOSIS — F0393 Unspecified dementia, unspecified severity, with mood disturbance: Secondary | ICD-10-CM | POA: Diagnosis not present

## 2022-04-22 DIAGNOSIS — N1831 Chronic kidney disease, stage 3a: Secondary | ICD-10-CM | POA: Diagnosis not present

## 2022-04-22 DIAGNOSIS — E782 Mixed hyperlipidemia: Secondary | ICD-10-CM | POA: Diagnosis not present

## 2022-04-22 DIAGNOSIS — F331 Major depressive disorder, recurrent, moderate: Secondary | ICD-10-CM | POA: Diagnosis not present

## 2022-04-24 DIAGNOSIS — F322 Major depressive disorder, single episode, severe without psychotic features: Secondary | ICD-10-CM | POA: Diagnosis not present

## 2022-04-24 DIAGNOSIS — I129 Hypertensive chronic kidney disease with stage 1 through stage 4 chronic kidney disease, or unspecified chronic kidney disease: Secondary | ICD-10-CM | POA: Diagnosis not present

## 2022-04-24 DIAGNOSIS — F0393 Unspecified dementia, unspecified severity, with mood disturbance: Secondary | ICD-10-CM | POA: Diagnosis not present

## 2022-04-24 DIAGNOSIS — S0101XD Laceration without foreign body of scalp, subsequent encounter: Secondary | ICD-10-CM | POA: Diagnosis not present

## 2022-04-24 DIAGNOSIS — N182 Chronic kidney disease, stage 2 (mild): Secondary | ICD-10-CM | POA: Diagnosis not present

## 2022-04-24 DIAGNOSIS — N3281 Overactive bladder: Secondary | ICD-10-CM | POA: Diagnosis not present

## 2022-04-25 DIAGNOSIS — F322 Major depressive disorder, single episode, severe without psychotic features: Secondary | ICD-10-CM | POA: Diagnosis not present

## 2022-04-25 DIAGNOSIS — I129 Hypertensive chronic kidney disease with stage 1 through stage 4 chronic kidney disease, or unspecified chronic kidney disease: Secondary | ICD-10-CM | POA: Diagnosis not present

## 2022-04-25 DIAGNOSIS — N182 Chronic kidney disease, stage 2 (mild): Secondary | ICD-10-CM | POA: Diagnosis not present

## 2022-04-25 DIAGNOSIS — F0393 Unspecified dementia, unspecified severity, with mood disturbance: Secondary | ICD-10-CM | POA: Diagnosis not present

## 2022-04-25 DIAGNOSIS — N3281 Overactive bladder: Secondary | ICD-10-CM | POA: Diagnosis not present

## 2022-04-25 DIAGNOSIS — S0101XD Laceration without foreign body of scalp, subsequent encounter: Secondary | ICD-10-CM | POA: Diagnosis not present

## 2022-04-29 DIAGNOSIS — I129 Hypertensive chronic kidney disease with stage 1 through stage 4 chronic kidney disease, or unspecified chronic kidney disease: Secondary | ICD-10-CM | POA: Diagnosis not present

## 2022-04-29 DIAGNOSIS — F0393 Unspecified dementia, unspecified severity, with mood disturbance: Secondary | ICD-10-CM | POA: Diagnosis not present

## 2022-04-29 DIAGNOSIS — F322 Major depressive disorder, single episode, severe without psychotic features: Secondary | ICD-10-CM | POA: Diagnosis not present

## 2022-04-29 DIAGNOSIS — S0101XD Laceration without foreign body of scalp, subsequent encounter: Secondary | ICD-10-CM | POA: Diagnosis not present

## 2022-04-29 DIAGNOSIS — N182 Chronic kidney disease, stage 2 (mild): Secondary | ICD-10-CM | POA: Diagnosis not present

## 2022-04-29 DIAGNOSIS — N3281 Overactive bladder: Secondary | ICD-10-CM | POA: Diagnosis not present

## 2022-05-01 DIAGNOSIS — N3281 Overactive bladder: Secondary | ICD-10-CM | POA: Diagnosis not present

## 2022-05-01 DIAGNOSIS — I129 Hypertensive chronic kidney disease with stage 1 through stage 4 chronic kidney disease, or unspecified chronic kidney disease: Secondary | ICD-10-CM | POA: Diagnosis not present

## 2022-05-01 DIAGNOSIS — F0393 Unspecified dementia, unspecified severity, with mood disturbance: Secondary | ICD-10-CM | POA: Diagnosis not present

## 2022-05-01 DIAGNOSIS — F322 Major depressive disorder, single episode, severe without psychotic features: Secondary | ICD-10-CM | POA: Diagnosis not present

## 2022-05-01 DIAGNOSIS — S0101XD Laceration without foreign body of scalp, subsequent encounter: Secondary | ICD-10-CM | POA: Diagnosis not present

## 2022-05-01 DIAGNOSIS — N182 Chronic kidney disease, stage 2 (mild): Secondary | ICD-10-CM | POA: Diagnosis not present

## 2022-05-02 DIAGNOSIS — S0101XD Laceration without foreign body of scalp, subsequent encounter: Secondary | ICD-10-CM | POA: Diagnosis not present

## 2022-05-02 DIAGNOSIS — N182 Chronic kidney disease, stage 2 (mild): Secondary | ICD-10-CM | POA: Diagnosis not present

## 2022-05-02 DIAGNOSIS — F322 Major depressive disorder, single episode, severe without psychotic features: Secondary | ICD-10-CM | POA: Diagnosis not present

## 2022-05-02 DIAGNOSIS — F0393 Unspecified dementia, unspecified severity, with mood disturbance: Secondary | ICD-10-CM | POA: Diagnosis not present

## 2022-05-02 DIAGNOSIS — N3281 Overactive bladder: Secondary | ICD-10-CM | POA: Diagnosis not present

## 2022-05-02 DIAGNOSIS — I129 Hypertensive chronic kidney disease with stage 1 through stage 4 chronic kidney disease, or unspecified chronic kidney disease: Secondary | ICD-10-CM | POA: Diagnosis not present

## 2022-05-06 DIAGNOSIS — N3281 Overactive bladder: Secondary | ICD-10-CM | POA: Diagnosis not present

## 2022-05-06 DIAGNOSIS — F322 Major depressive disorder, single episode, severe without psychotic features: Secondary | ICD-10-CM | POA: Diagnosis not present

## 2022-05-06 DIAGNOSIS — F0393 Unspecified dementia, unspecified severity, with mood disturbance: Secondary | ICD-10-CM | POA: Diagnosis not present

## 2022-05-06 DIAGNOSIS — I129 Hypertensive chronic kidney disease with stage 1 through stage 4 chronic kidney disease, or unspecified chronic kidney disease: Secondary | ICD-10-CM | POA: Diagnosis not present

## 2022-05-06 DIAGNOSIS — S0101XD Laceration without foreign body of scalp, subsequent encounter: Secondary | ICD-10-CM | POA: Diagnosis not present

## 2022-05-06 DIAGNOSIS — N182 Chronic kidney disease, stage 2 (mild): Secondary | ICD-10-CM | POA: Diagnosis not present

## 2022-05-07 DIAGNOSIS — I129 Hypertensive chronic kidney disease with stage 1 through stage 4 chronic kidney disease, or unspecified chronic kidney disease: Secondary | ICD-10-CM | POA: Diagnosis not present

## 2022-05-07 DIAGNOSIS — F322 Major depressive disorder, single episode, severe without psychotic features: Secondary | ICD-10-CM | POA: Diagnosis not present

## 2022-05-07 DIAGNOSIS — S0101XD Laceration without foreign body of scalp, subsequent encounter: Secondary | ICD-10-CM | POA: Diagnosis not present

## 2022-05-07 DIAGNOSIS — N3281 Overactive bladder: Secondary | ICD-10-CM | POA: Diagnosis not present

## 2022-05-07 DIAGNOSIS — N182 Chronic kidney disease, stage 2 (mild): Secondary | ICD-10-CM | POA: Diagnosis not present

## 2022-05-07 DIAGNOSIS — F0393 Unspecified dementia, unspecified severity, with mood disturbance: Secondary | ICD-10-CM | POA: Diagnosis not present

## 2022-05-08 DIAGNOSIS — Z23 Encounter for immunization: Secondary | ICD-10-CM | POA: Diagnosis not present

## 2022-05-10 DIAGNOSIS — N182 Chronic kidney disease, stage 2 (mild): Secondary | ICD-10-CM | POA: Diagnosis not present

## 2022-05-10 DIAGNOSIS — N3281 Overactive bladder: Secondary | ICD-10-CM | POA: Diagnosis not present

## 2022-05-10 DIAGNOSIS — I129 Hypertensive chronic kidney disease with stage 1 through stage 4 chronic kidney disease, or unspecified chronic kidney disease: Secondary | ICD-10-CM | POA: Diagnosis not present

## 2022-05-10 DIAGNOSIS — F322 Major depressive disorder, single episode, severe without psychotic features: Secondary | ICD-10-CM | POA: Diagnosis not present

## 2022-05-10 DIAGNOSIS — F0393 Unspecified dementia, unspecified severity, with mood disturbance: Secondary | ICD-10-CM | POA: Diagnosis not present

## 2022-05-10 DIAGNOSIS — S0101XD Laceration without foreign body of scalp, subsequent encounter: Secondary | ICD-10-CM | POA: Diagnosis not present

## 2022-05-14 DIAGNOSIS — F0393 Unspecified dementia, unspecified severity, with mood disturbance: Secondary | ICD-10-CM | POA: Diagnosis not present

## 2022-05-14 DIAGNOSIS — E782 Mixed hyperlipidemia: Secondary | ICD-10-CM | POA: Diagnosis not present

## 2022-05-14 DIAGNOSIS — I129 Hypertensive chronic kidney disease with stage 1 through stage 4 chronic kidney disease, or unspecified chronic kidney disease: Secondary | ICD-10-CM | POA: Diagnosis not present

## 2022-05-14 DIAGNOSIS — G301 Alzheimer's disease with late onset: Secondary | ICD-10-CM | POA: Diagnosis not present

## 2022-05-14 DIAGNOSIS — F322 Major depressive disorder, single episode, severe without psychotic features: Secondary | ICD-10-CM | POA: Diagnosis not present

## 2022-05-14 DIAGNOSIS — H814 Vertigo of central origin: Secondary | ICD-10-CM | POA: Diagnosis not present

## 2022-05-14 DIAGNOSIS — N3281 Overactive bladder: Secondary | ICD-10-CM | POA: Diagnosis not present

## 2022-05-14 DIAGNOSIS — F0280 Dementia in other diseases classified elsewhere without behavioral disturbance: Secondary | ICD-10-CM | POA: Diagnosis not present

## 2022-05-14 DIAGNOSIS — N1831 Chronic kidney disease, stage 3a: Secondary | ICD-10-CM | POA: Diagnosis not present

## 2022-05-14 DIAGNOSIS — N182 Chronic kidney disease, stage 2 (mild): Secondary | ICD-10-CM | POA: Diagnosis not present

## 2022-05-14 DIAGNOSIS — E559 Vitamin D deficiency, unspecified: Secondary | ICD-10-CM | POA: Diagnosis not present

## 2022-05-14 DIAGNOSIS — S0101XD Laceration without foreign body of scalp, subsequent encounter: Secondary | ICD-10-CM | POA: Diagnosis not present

## 2022-05-16 DIAGNOSIS — N3281 Overactive bladder: Secondary | ICD-10-CM | POA: Diagnosis not present

## 2022-05-16 DIAGNOSIS — S0101XD Laceration without foreign body of scalp, subsequent encounter: Secondary | ICD-10-CM | POA: Diagnosis not present

## 2022-05-16 DIAGNOSIS — I129 Hypertensive chronic kidney disease with stage 1 through stage 4 chronic kidney disease, or unspecified chronic kidney disease: Secondary | ICD-10-CM | POA: Diagnosis not present

## 2022-05-16 DIAGNOSIS — F322 Major depressive disorder, single episode, severe without psychotic features: Secondary | ICD-10-CM | POA: Diagnosis not present

## 2022-05-16 DIAGNOSIS — N182 Chronic kidney disease, stage 2 (mild): Secondary | ICD-10-CM | POA: Diagnosis not present

## 2022-05-16 DIAGNOSIS — F0393 Unspecified dementia, unspecified severity, with mood disturbance: Secondary | ICD-10-CM | POA: Diagnosis not present

## 2022-05-17 DIAGNOSIS — I700 Atherosclerosis of aorta: Secondary | ICD-10-CM | POA: Diagnosis not present

## 2022-05-17 DIAGNOSIS — S0101XD Laceration without foreign body of scalp, subsequent encounter: Secondary | ICD-10-CM | POA: Diagnosis not present

## 2022-05-17 DIAGNOSIS — D519 Vitamin B12 deficiency anemia, unspecified: Secondary | ICD-10-CM | POA: Diagnosis not present

## 2022-05-17 DIAGNOSIS — N3281 Overactive bladder: Secondary | ICD-10-CM | POA: Diagnosis not present

## 2022-05-17 DIAGNOSIS — Z9181 History of falling: Secondary | ICD-10-CM | POA: Diagnosis not present

## 2022-05-17 DIAGNOSIS — F0393 Unspecified dementia, unspecified severity, with mood disturbance: Secondary | ICD-10-CM | POA: Diagnosis not present

## 2022-05-17 DIAGNOSIS — I129 Hypertensive chronic kidney disease with stage 1 through stage 4 chronic kidney disease, or unspecified chronic kidney disease: Secondary | ICD-10-CM | POA: Diagnosis not present

## 2022-05-17 DIAGNOSIS — F322 Major depressive disorder, single episode, severe without psychotic features: Secondary | ICD-10-CM | POA: Diagnosis not present

## 2022-05-17 DIAGNOSIS — M159 Polyosteoarthritis, unspecified: Secondary | ICD-10-CM | POA: Diagnosis not present

## 2022-05-17 DIAGNOSIS — N182 Chronic kidney disease, stage 2 (mild): Secondary | ICD-10-CM | POA: Diagnosis not present

## 2022-05-20 DIAGNOSIS — R300 Dysuria: Secondary | ICD-10-CM | POA: Diagnosis not present

## 2022-05-21 DIAGNOSIS — F0393 Unspecified dementia, unspecified severity, with mood disturbance: Secondary | ICD-10-CM | POA: Diagnosis not present

## 2022-05-21 DIAGNOSIS — I129 Hypertensive chronic kidney disease with stage 1 through stage 4 chronic kidney disease, or unspecified chronic kidney disease: Secondary | ICD-10-CM | POA: Diagnosis not present

## 2022-05-21 DIAGNOSIS — N182 Chronic kidney disease, stage 2 (mild): Secondary | ICD-10-CM | POA: Diagnosis not present

## 2022-05-21 DIAGNOSIS — F322 Major depressive disorder, single episode, severe without psychotic features: Secondary | ICD-10-CM | POA: Diagnosis not present

## 2022-05-21 DIAGNOSIS — S0101XD Laceration without foreign body of scalp, subsequent encounter: Secondary | ICD-10-CM | POA: Diagnosis not present

## 2022-05-21 DIAGNOSIS — N3281 Overactive bladder: Secondary | ICD-10-CM | POA: Diagnosis not present

## 2022-05-23 DIAGNOSIS — N3281 Overactive bladder: Secondary | ICD-10-CM | POA: Diagnosis not present

## 2022-05-23 DIAGNOSIS — S0101XD Laceration without foreign body of scalp, subsequent encounter: Secondary | ICD-10-CM | POA: Diagnosis not present

## 2022-05-23 DIAGNOSIS — I129 Hypertensive chronic kidney disease with stage 1 through stage 4 chronic kidney disease, or unspecified chronic kidney disease: Secondary | ICD-10-CM | POA: Diagnosis not present

## 2022-05-28 DIAGNOSIS — Z23 Encounter for immunization: Secondary | ICD-10-CM | POA: Diagnosis not present

## 2022-05-28 DIAGNOSIS — F322 Major depressive disorder, single episode, severe without psychotic features: Secondary | ICD-10-CM | POA: Diagnosis not present

## 2022-05-28 DIAGNOSIS — F0393 Unspecified dementia, unspecified severity, with mood disturbance: Secondary | ICD-10-CM | POA: Diagnosis not present

## 2022-05-28 DIAGNOSIS — I129 Hypertensive chronic kidney disease with stage 1 through stage 4 chronic kidney disease, or unspecified chronic kidney disease: Secondary | ICD-10-CM | POA: Diagnosis not present

## 2022-05-28 DIAGNOSIS — N182 Chronic kidney disease, stage 2 (mild): Secondary | ICD-10-CM | POA: Diagnosis not present

## 2022-05-28 DIAGNOSIS — S0101XD Laceration without foreign body of scalp, subsequent encounter: Secondary | ICD-10-CM | POA: Diagnosis not present

## 2022-05-28 DIAGNOSIS — N3281 Overactive bladder: Secondary | ICD-10-CM | POA: Diagnosis not present

## 2022-06-05 DIAGNOSIS — I129 Hypertensive chronic kidney disease with stage 1 through stage 4 chronic kidney disease, or unspecified chronic kidney disease: Secondary | ICD-10-CM | POA: Diagnosis not present

## 2022-06-05 DIAGNOSIS — F0393 Unspecified dementia, unspecified severity, with mood disturbance: Secondary | ICD-10-CM | POA: Diagnosis not present

## 2022-06-05 DIAGNOSIS — N3281 Overactive bladder: Secondary | ICD-10-CM | POA: Diagnosis not present

## 2022-06-05 DIAGNOSIS — F322 Major depressive disorder, single episode, severe without psychotic features: Secondary | ICD-10-CM | POA: Diagnosis not present

## 2022-06-05 DIAGNOSIS — S0101XD Laceration without foreign body of scalp, subsequent encounter: Secondary | ICD-10-CM | POA: Diagnosis not present

## 2022-06-05 DIAGNOSIS — N182 Chronic kidney disease, stage 2 (mild): Secondary | ICD-10-CM | POA: Diagnosis not present

## 2022-06-10 DIAGNOSIS — F331 Major depressive disorder, recurrent, moderate: Secondary | ICD-10-CM | POA: Diagnosis not present

## 2022-06-10 DIAGNOSIS — E782 Mixed hyperlipidemia: Secondary | ICD-10-CM | POA: Diagnosis not present

## 2022-06-11 DIAGNOSIS — H814 Vertigo of central origin: Secondary | ICD-10-CM | POA: Diagnosis not present

## 2022-06-11 DIAGNOSIS — E559 Vitamin D deficiency, unspecified: Secondary | ICD-10-CM | POA: Diagnosis not present

## 2022-06-11 DIAGNOSIS — I129 Hypertensive chronic kidney disease with stage 1 through stage 4 chronic kidney disease, or unspecified chronic kidney disease: Secondary | ICD-10-CM | POA: Diagnosis not present

## 2022-06-11 DIAGNOSIS — N1831 Chronic kidney disease, stage 3a: Secondary | ICD-10-CM | POA: Diagnosis not present

## 2022-06-11 DIAGNOSIS — G301 Alzheimer's disease with late onset: Secondary | ICD-10-CM | POA: Diagnosis not present

## 2022-06-11 DIAGNOSIS — E782 Mixed hyperlipidemia: Secondary | ICD-10-CM | POA: Diagnosis not present

## 2022-06-11 DIAGNOSIS — F0280 Dementia in other diseases classified elsewhere without behavioral disturbance: Secondary | ICD-10-CM | POA: Diagnosis not present

## 2022-06-12 DIAGNOSIS — S0101XD Laceration without foreign body of scalp, subsequent encounter: Secondary | ICD-10-CM | POA: Diagnosis not present

## 2022-06-12 DIAGNOSIS — F322 Major depressive disorder, single episode, severe without psychotic features: Secondary | ICD-10-CM | POA: Diagnosis not present

## 2022-06-12 DIAGNOSIS — I129 Hypertensive chronic kidney disease with stage 1 through stage 4 chronic kidney disease, or unspecified chronic kidney disease: Secondary | ICD-10-CM | POA: Diagnosis not present

## 2022-06-12 DIAGNOSIS — N3281 Overactive bladder: Secondary | ICD-10-CM | POA: Diagnosis not present

## 2022-06-12 DIAGNOSIS — N182 Chronic kidney disease, stage 2 (mild): Secondary | ICD-10-CM | POA: Diagnosis not present

## 2022-06-12 DIAGNOSIS — F0393 Unspecified dementia, unspecified severity, with mood disturbance: Secondary | ICD-10-CM | POA: Diagnosis not present

## 2022-06-16 DIAGNOSIS — N3281 Overactive bladder: Secondary | ICD-10-CM | POA: Diagnosis not present

## 2022-06-16 DIAGNOSIS — S0101XD Laceration without foreign body of scalp, subsequent encounter: Secondary | ICD-10-CM | POA: Diagnosis not present

## 2022-06-16 DIAGNOSIS — F322 Major depressive disorder, single episode, severe without psychotic features: Secondary | ICD-10-CM | POA: Diagnosis not present

## 2022-06-16 DIAGNOSIS — Z9181 History of falling: Secondary | ICD-10-CM | POA: Diagnosis not present

## 2022-06-16 DIAGNOSIS — N182 Chronic kidney disease, stage 2 (mild): Secondary | ICD-10-CM | POA: Diagnosis not present

## 2022-06-16 DIAGNOSIS — M159 Polyosteoarthritis, unspecified: Secondary | ICD-10-CM | POA: Diagnosis not present

## 2022-06-16 DIAGNOSIS — I700 Atherosclerosis of aorta: Secondary | ICD-10-CM | POA: Diagnosis not present

## 2022-06-16 DIAGNOSIS — D519 Vitamin B12 deficiency anemia, unspecified: Secondary | ICD-10-CM | POA: Diagnosis not present

## 2022-06-16 DIAGNOSIS — I129 Hypertensive chronic kidney disease with stage 1 through stage 4 chronic kidney disease, or unspecified chronic kidney disease: Secondary | ICD-10-CM | POA: Diagnosis not present

## 2022-06-16 DIAGNOSIS — F0393 Unspecified dementia, unspecified severity, with mood disturbance: Secondary | ICD-10-CM | POA: Diagnosis not present

## 2022-06-17 DIAGNOSIS — I129 Hypertensive chronic kidney disease with stage 1 through stage 4 chronic kidney disease, or unspecified chronic kidney disease: Secondary | ICD-10-CM | POA: Diagnosis not present

## 2022-06-17 DIAGNOSIS — F322 Major depressive disorder, single episode, severe without psychotic features: Secondary | ICD-10-CM | POA: Diagnosis not present

## 2022-06-17 DIAGNOSIS — F0393 Unspecified dementia, unspecified severity, with mood disturbance: Secondary | ICD-10-CM | POA: Diagnosis not present

## 2022-06-17 DIAGNOSIS — I700 Atherosclerosis of aorta: Secondary | ICD-10-CM | POA: Diagnosis not present

## 2022-06-17 DIAGNOSIS — N182 Chronic kidney disease, stage 2 (mild): Secondary | ICD-10-CM | POA: Diagnosis not present

## 2022-06-17 DIAGNOSIS — M159 Polyosteoarthritis, unspecified: Secondary | ICD-10-CM | POA: Diagnosis not present

## 2022-06-20 ENCOUNTER — Ambulatory Visit (INDEPENDENT_AMBULATORY_CARE_PROVIDER_SITE_OTHER): Payer: Medicare Other

## 2022-06-20 VITALS — Ht 66.0 in | Wt 135.0 lb

## 2022-06-20 DIAGNOSIS — N182 Chronic kidney disease, stage 2 (mild): Secondary | ICD-10-CM | POA: Diagnosis not present

## 2022-06-20 DIAGNOSIS — M159 Polyosteoarthritis, unspecified: Secondary | ICD-10-CM | POA: Diagnosis not present

## 2022-06-20 DIAGNOSIS — Z0000 Encounter for general adult medical examination without abnormal findings: Secondary | ICD-10-CM | POA: Diagnosis not present

## 2022-06-20 DIAGNOSIS — I700 Atherosclerosis of aorta: Secondary | ICD-10-CM | POA: Diagnosis not present

## 2022-06-20 DIAGNOSIS — F322 Major depressive disorder, single episode, severe without psychotic features: Secondary | ICD-10-CM | POA: Diagnosis not present

## 2022-06-20 DIAGNOSIS — I129 Hypertensive chronic kidney disease with stage 1 through stage 4 chronic kidney disease, or unspecified chronic kidney disease: Secondary | ICD-10-CM | POA: Diagnosis not present

## 2022-06-20 DIAGNOSIS — F0393 Unspecified dementia, unspecified severity, with mood disturbance: Secondary | ICD-10-CM | POA: Diagnosis not present

## 2022-06-20 NOTE — Progress Notes (Addendum)
Virtual Visit via Telephone Note  I connected with  Jocelyn [name] on 06/20/22 at  1:45 PM EST by telephone and verified that I am speaking with the correct person using two identifiers.  Location: Patient: home  Provider: [place] Persons participating in the virtual visit: [place]   I discussed the limitations, risks, security and privacy concerns of performing an evaluation and management service by telephone and the availability of in person appointments. The patient expressed understanding and agreed to proceed.  Interactive audio and video telecommunications were attempted between this nurse and patient, however failed, due to patient having technical difficulties OR patient did not have access to video capability.  We continued and completed visit with audio only.  Some vital signs may be absent or patient reported.   Sheral Flow, LPN  Subjective:   Jocelyn [name] is a 88 y.o. female who presents for an Initial Medicare Annual Wellness Visit.  Review of Systems     Cardiac Risk Factors include: advanced age (>50mn, >>57women);family history of premature cardiovascular disease;dyslipidemia;hypertension     Objective:    Today's Vitals   06/20/22 1356  Weight: 135 lb (61.2 kg)  Height: '5\' 6"'$  (1.676 m)  PainSc: 0-No pain   Body mass index is 21.79 kg/m.     06/20/2022    2:05 PM 06/14/2021    2:58 PM 06/29/2020    1:02 PM 06/19/2020    4:13 PM 02/16/2020    3:19 PM 12/08/2017   11:40 AM 03/11/2016    9:56 AM  Advanced Directives  Does Patient Have a Medical Advance Directive? No Yes No No No No Yes  Type of Advance Directive  Living will;Healthcare Power of Attorney       Does patient want to make changes to medical advance directive?  No - Patient declined   Yes (MAU/Ambulatory/Procedural Areas - Information given)    Copy of [place] Chart?  No - copy requested     Yes  Would patient like information on creating  a medical advance directive? No - Patient declined  No - Patient declined No - Patient declined  Yes (ED - Information included in AVS)     Current Medications (verified) Outpatient Encounter Medications as of 06/20/2022  Medication Sig   atorvastatin (LIPITOR) 40 MG tablet TAKE ONE TABLET BY MOUTH ONE TIME DAILY   clopidogrel (PLAVIX) 75 MG tablet TAKE ONE TABLET BY MOUTH ONE TIME DAILY   collagenase (SANTYL) ointment Apply 1 application topically daily.   Cyanocobalamin (B-12 COMPLIANCE INJECTION IJ) Inject 1 Dose as directed every 30 (thirty) days.   diazepam (VALIUM) 2 MG tablet Take 1 tablet (2 mg total) by mouth every 8 (eight) hours as needed for anxiety.   DULoxetine (CYMBALTA) 60 MG capsule TAKE ONE CAPSULE BY MOUTH ONE TIME DAILY   memantine (NAMENDA) 10 MG tablet TAKE ONE TABLET BY MOUTH TWICE DAILY   solifenacin (VESICARE) 10 MG tablet TAKE ONE TABLET BY MOUTH ONE TIME DAILY   white petrolatum (VASELINE) GEL Apply 1 application. topically as needed for lip care.   No facility-administered encounter medications on file as of 06/20/2022.    Allergies (verified) Codeine and Tape   History: Past Medical History:  Diagnosis Date   HTN (hypertension)    Hyperlipidemia    Urinary incontinence    Past Surgical History:  Procedure Laterality Date   ABDOMINAL HYSTERECTOMY     TONSILLECTOMY     Family History  Problem  Relation Age of Onset   Diabetes Other        1st degree relative   Hyperlipidemia Other    Stroke Other        1st degree Female relative <50   Cancer Neg Hx    Early death Neg Hx    Heart disease Neg Hx    Hypertension Neg Hx    Kidney disease Neg Hx    Social History   Socioeconomic History   Marital status: Widowed    Spouse name: Not on file   Number of children: 1   Years of education: Not on file   Highest education level: Not on file  Occupational History   Occupation: Retired  Tobacco Use   Smoking status: Former   Smokeless tobacco:  Former    Quit date: 03/17/2017   Tobacco comments:    [place] 25 YEARS AGO  Vaping Use   Vaping Use: Never used  Substance and Sexual Activity   Alcohol use: Yes    Alcohol/week: 14.0 standard drinks of alcohol    Types: 7 Glasses of wine, 7 Cans of beer per week   Drug use: No   Sexual activity: Never    Birth control/protection: Surgical  Other Topics Concern   Not on file  Social History Narrative   Regular Exercise-no         Social Determinants of Health   Financial Resource Strain: Low Risk  (06/14/2021)   Overall Financial Resource Strain (CARDIA)    Difficulty of Paying Living Expenses: Not hard at all  Food Insecurity: No Food Insecurity (06/20/2022)   Hunger Vital Sign    Worried About Running Out of Food in the Last Year: Never true    Ran Out of Food in the Last Year: Never true  Transportation Needs: No Transportation Needs (06/20/2022)   PRAPARE - Hydrologist (Medical): No    Lack of Transportation (Non-Medical): No  Physical Activity: Inactive (06/20/2022)   Exercise Vital Sign    Days of Exercise per Week: 0 days    Minutes of Exercise per Session: 0 min  Stress: No Stress Concern Present (06/20/2022)   [place]    Feeling of Stress : Not at all  Social Connections: Moderately Integrated (06/20/2022)   Social Connection and Isolation Panel [NHANES]    Frequency of Communication with Friends and Family: More than three times a week    Frequency of Social Gatherings with Friends and Family: More than three times a week    Attends Religious Services: More than 4 times per year    Active Member of [employer] or Organizations: Yes    Attends Archivist Meetings: More than 4 times per year    Marital Status: Widowed    Tobacco Counseling Counseling given: Not Answered Tobacco comments: STATES SHE QUIT 25 YEARS AGO   Clinical Intake:  Pre-visit  preparation completed: Yes  Pain : No/denies pain Pain Score: 0-No pain     BMI - recorded: 21.79 Nutritional Status: BMI of 19-24  Normal Nutritional Risks: None Diabetes: Yes  How often do you need to have someone help you when you read instructions, pamphlets, or other written materials from your doctor or pharmacy?: 1 - Never What is the last grade level you completed in school?: HSG  Diabetic? no  Interpreter Needed?: No  Information entered by :: Lisette Abu, LPN.   Activities of Daily  Living    06/20/2022    2:07 PM  In your present state of health, do you have any difficulty performing the following activities:  Hearing? 0  Vision? 0  Difficulty concentrating or making decisions? 0  Walking or climbing stairs? 0  Dressing or bathing? 0  Doing errands, shopping? 0  Preparing Food and eating ? N  Using the Toilet? N  In the past six months, have you accidently leaked urine? N  Do you have problems with loss of bowel control? N  Managing your Medications? N  Managing your Finances? N  Housekeeping or managing your Housekeeping? N    Patient Care Team: Janith Lima, MD as PCP - General Szabat, Darnelle Maffucci, [facility] (Inactive) (Pharmacist) Szabat, Darnelle Maffucci, [facility] (Inactive) as Pharmacist (Pharmacist) Calvert Cantor, MD as Consulting Physician (Ophthalmology)  Indicate any recent Medical Services you may have received from other than [facility] providers in the past year (date may be approximate).     Assessment:   This is a routine wellness examination for [facility].  Hearing/Vision screen Hearing Screening - Comments:: Denies hearing difficulties   Vision Screening - Comments:: Wears rx glasses - up to date with routine eye exams with  [facility]   Dietary issues and exercise activities discussed: Current Exercise Habits: The patient does not participate in regular exercise at present, Exercise limited by: orthopedic condition(s)   Goals Addressed              This Visit's Progress    Patient Stated       To maintain my health and independence.      Depression Screen    06/20/2022    2:02 PM 03/11/2022    9:23 AM 06/14/2021    2:56 PM 01/26/2021    2:07 PM 06/26/2020   11:56 AM 02/16/2020    3:20 PM 01/25/2020   11:34 AM  PHQ 2/9 Scores  PHQ - 2 Score 0 2 0  2 0 6  PHQ- 9 Score  '12   10  17  '$ Exception Documentation    Patient refusal       Fall Risk    06/20/2022    2:06 PM 06/14/2021    2:59 PM 06/26/2020   10:39 AM 02/16/2020    3:20 PM 01/25/2020   11:34 AM  Fall Risk   Falls in the past year? 1 1 0 0 0  Number falls in past yr: 1 0 1 0 0  Injury with Fall? '1 1 1 '$ 0 0  Risk for fall due to : History of fall(s);Impaired balance/gait;Orthopedic patient Impaired balance/gait Impaired balance/gait;Impaired mobility No Fall Risks No Fall Risks  Follow up Education provided;Falls prevention discussed Falls evaluation completed Falls evaluation completed Falls evaluation completed Falls evaluation completed    FALL RISK PREVENTION PERTAINING TO THE HOME:  Any stairs in or around the home? No  If so, are there any without handrails? No  Home free of loose throw rugs in walkways, pet beds, electrical cords, etc? Yes  Adequate lighting in your home to reduce risk of falls? Yes   ASSISTIVE DEVICES UTILIZED TO PREVENT FALLS:  Life alert? No  Use of a cane, walker or w/c? Yes  Grab bars in the bathroom? Yes  Shower chair or bench in shower? Yes  Elevated toilet seat or a handicapped toilet? Yes   TIMED UP AND GO:  Was the test performed? No . Phone Visir  Cognitive Function:    10/25/2021  1:34 PM 04/23/2021    1:22 PM 10/17/2020   10:06 AM 06/19/2020    9:57 AM 12/08/2017   11:50 AM  MMSE - Mini Mental State Exam  Orientation to time '3 4 3 4 5  '$ Orientation to Place '5 4 5 5 5  '$ Registration '3 3 3 3 3  '$ Attention/ Calculation 0 0 '2 1 5  '$ Recall '2 2 1 2 3  '$ Language- name 2 objects '2 2 2 2 2  '$ Language- repeat 0 0 '1 1 1   '$ Language- follow 3 step command '3 3 3 3 3  '$ Language- read & follow direction '1 1 1 1 1  '$ Write a sentence '1 1 1 1 1  '$ Copy design 0 0 '1 1 1  '$ Copy design-comments    6 animals   Total score '20 20 23 24 30        '$ 06/20/2022    2:08 PM  6CIT Screen  What Year? 0 points  What month? 0 points  What time? 0 points  Count back from 20 0 points  Months in reverse 0 points  Repeat phrase 0 points  Total Score 0 points    Immunizations Immunization History  Administered Date(s) Administered   Fluad Quad(high Dose 65+) 05/15/2020, 05/14/2022   Influenza Whole 05/22/2010, 06/12/2012   Influenza, High Dose Seasonal PF 05/28/2017   Influenza-Unspecified 06/05/2016, 04/08/2018, 06/05/2021   Moderna Covid-19 Vaccine Bivalent Booster 84yr & up 05/18/2021   Moderna Sars-Covid-2 Vaccination 12/28/2019, 01/12/2020   PFIZER Comirnaty(Gray Top)Covid-19 Tri-Sucrose Vaccine 05/28/2022   PNEUMOCOCCAL CONJUGATE-20 05/18/2021   PPD Test 03/06/2022   Pneumococcal Conjugate-13 06/09/2013   Pneumococcal Polysaccharide-23 03/18/2017   Td 09/20/2010   Tdap 06/19/2020   Zoster, Live 09/20/2010    TDAP status: Up to date  Flu Vaccine status: Up to date  Pneumococcal vaccine status: Up to date  Covid-19 vaccine status: Completed vaccines  Qualifies for Shingles Vaccine? Yes   Zostavax completed Yes   Shingrix Completed?: No.    Education has been provided regarding the importance of this vaccine. Patient has been advised to call insurance company to determine out of pocket expense if they have not yet received this vaccine. Advised may also receive vaccine at local pharmacy or Health Dept. Verbalized acceptance and understanding.  Screening Tests Health Maintenance  Topic Date Due   Zoster Vaccines- Shingrix (1 of 2) Never done   COVID-19 Vaccine (4 - Moderna series) 09/28/2022   Medicare Annual Wellness (AWV)  06/21/2023   TETANUS/TDAP  06/19/2030   Pneumonia Vaccine 88 Years old  Completed    INFLUENZA VACCINE  Completed   DEXA SCAN  Completed   HPV VACCINES  Aged Out    Health Maintenance  Health Maintenance Due  Topic Date Due   Zoster Vaccines- Shingrix (1 of 2) Never done    Colorectal cancer screening: No longer required.   Mammogram status: No longer required due to age.  Bone density scan: Never done  Lung Cancer Screening: (Low Dose CT Chest recommended if Age 86-80years, 30 pack-year currently smoking OR have quit w/in 15years.) does not qualify.   Lung Cancer Screening Referral: no  Additional Screening:  Hepatitis C Screening: does not qualify; Completed no  Vision Screening: Recommended annual ophthalmology exams for early detection of glaucoma and other disorders of the eye. Is the patient up to date with their annual eye exam?  Yes  Who is the provider or what is the name of the office in which the  patient attends annual eye exams? [facility] If pt is not established with a provider, would they like to be referred to a provider to establish care? No .   Dental Screening: Recommended annual dental exams for proper oral hygiene  Community Resource Referral / Chronic Care Management: CRR required this visit?  No   CCM required this visit?  No      Plan:     I have personally reviewed and noted the following in the patient's chart:   Medical and social history Use of alcohol, tobacco or illicit drugs  Current medications and supplements including opioid prescriptions. Patient is not currently taking opioid prescriptions. Functional ability and status Nutritional status Physical activity Advanced directives List of other physicians Hospitalizations, surgeries, and ER visits in previous 12 months Vitals Screenings to include cognitive, depression, and falls Referrals and appointments  In addition, I have reviewed and discussed with patient certain preventive protocols, quality metrics, and best practice recommendations. A  written personalized care plan for preventive services as well as general preventive health recommendations were provided to patient.     Sheral Flow, LPN   [id number]   Nurse Notes: N/A

## 2022-06-20 NOTE — Patient Instructions (Addendum)
Jocelyn [name] , Thank you for taking time to come for your Medicare Wellness Visit. I appreciate your ongoing commitment to your health goals. Please review the following plan we discussed and let me know if I can assist you in the future.   These are the goals we discussed:  Goals      Patient Stated     To maintain my health and independence.        This is a list of the screening recommended for you and due dates:  Health Maintenance  Topic Date Due   Zoster (Shingles) Vaccine (1 of 2) Never done   COVID-19 Vaccine (4 - Moderna series) 09/28/2022   Medicare Annual Wellness Visit  06/21/2023   Tetanus Vaccine  06/19/2030   Pneumonia Vaccine  Completed   Flu Shot  Completed   DEXA scan (bone density measurement)  Completed   HPV Vaccine  Aged Out    Advanced directives: No  Conditions/risks identified: Yes  Next appointment: Follow up in one year for your annual wellness visit.   Preventive Care 11 Years and Older, Female Preventive care refers to lifestyle choices and visits with your health care provider that can promote health and wellness. What does preventive care include? A yearly physical exam. This is also called an annual well check. Dental exams once or twice a year. Routine eye exams. Ask your health care provider how often you should have your eyes checked. Personal lifestyle choices, including: Daily care of your teeth and gums. Regular physical activity. Eating a healthy diet. Avoiding tobacco and drug use. Limiting alcohol use. Practicing safe sex. Taking low-dose aspirin every day. Taking vitamin and mineral supplements as recommended by your health care provider. What happens during an annual well check? The services and screenings done by your health care provider during your annual well check will depend on your age, overall health, lifestyle risk factors, and family history of disease. Counseling  Your health care provider may ask you questions  about your: Alcohol use. Tobacco use. Drug use. Emotional well-being. Home and relationship well-being. Sexual activity. Eating habits. History of falls. Memory and ability to understand (cognition). Work and work Statistician. Reproductive health. Screening  You may have the following tests or measurements: Height, weight, and BMI. Blood pressure. Lipid and cholesterol levels. These may be checked every 5 years, or more frequently if you are over 13 years old. Skin check. Lung cancer screening. You may have this screening every year starting at age 64 if you have a 30-pack-year history of smoking and currently smoke or have quit within the past 15 years. Fecal occult blood test (FOBT) of the stool. You may have this test every year starting at age 43. Flexible sigmoidoscopy or colonoscopy. You may have a sigmoidoscopy every 5 years or a colonoscopy every 10 years starting at age 30. Hepatitis C blood test. Hepatitis B blood test. Sexually transmitted disease (STD) testing. Diabetes screening. This is done by checking your blood sugar (glucose) after you have not eaten for a while (fasting). You may have this done every 1-3 years. Bone density scan. This is done to screen for osteoporosis. You may have this done starting at age 72. Mammogram. This may be done every 1-2 years. Talk to your health care provider about how often you should have regular mammograms. Talk with your health care provider about your test results, treatment options, and if necessary, the need for more tests. Vaccines  Your health care provider may recommend certain  vaccines, such as: Influenza vaccine. This is recommended every year. Tetanus, diphtheria, and acellular pertussis (Tdap, Td) vaccine. You may need a Td booster every 10 years. Zoster vaccine. You may need this after age 70. Pneumococcal 13-valent conjugate (PCV13) vaccine. One dose is recommended after age 68. Pneumococcal polysaccharide (PPSV23)  vaccine. One dose is recommended after age 72. Talk to your health care provider about which screenings and vaccines you need and how often you need them. This information is not intended to replace advice given to you by your health care provider. Make sure you discuss any questions you have with your health care provider. Document Released: 08/25/2015 Document Revised: 04/17/2016 Document Reviewed: 05/30/2015 Elsevier Interactive Patient Education  2017 [place] Prevention in the Home Falls can cause injuries. They can happen to people of all ages. There are many things you can do to make your home safe and to help prevent falls. What can I do on the outside of my home? Regularly fix the edges of walkways and driveways and fix any cracks. Remove anything that might make you trip as you walk through a door, such as a raised step or threshold. Trim any bushes or trees on the path to your home. Use bright outdoor lighting. Clear any walking paths of anything that might make someone trip, such as rocks or tools. Regularly check to see if handrails are loose or broken. Make sure that both sides of any steps have handrails. Any raised decks and porches should have guardrails on the edges. Have any leaves, snow, or ice cleared regularly. Use sand or salt on walking paths during winter. Clean up any spills in your garage right away. This includes oil or grease spills. What can I do in the bathroom? Use night lights. Install grab bars by the toilet and in the tub and shower. Do not use towel bars as grab bars. Use non-skid mats or decals in the tub or shower. If you need to sit down in the shower, use a plastic, non-slip stool. Keep the floor dry. Clean up any water that spills on the floor as soon as it happens. Remove soap buildup in the tub or shower regularly. Attach bath mats securely with double-sided non-slip rug tape. Do not have throw rugs and other things on the floor that  can make you trip. What can I do in the bedroom? Use night lights. Make sure that you have a light by your bed that is easy to reach. Do not use any sheets or blankets that are too big for your bed. They should not hang down onto the floor. Have a firm chair that has side arms. You can use this for support while you get dressed. Do not have throw rugs and other things on the floor that can make you trip. What can I do in the kitchen? Clean up any spills right away. Avoid walking on wet floors. Keep items that you use a lot in easy-to-reach places. If you need to reach something above you, use a strong step stool that has a grab bar. Keep electrical cords out of the way. Do not use floor polish or wax that makes floors slippery. If you must use wax, use non-skid floor wax. Do not have throw rugs and other things on the floor that can make you trip. What can I do with my stairs? Do not leave any items on the stairs. Make sure that there are handrails on both sides of the stairs  and use them. Fix handrails that are broken or loose. Make sure that handrails are as long as the stairways. Check any carpeting to make sure that it is firmly attached to the stairs. Fix any carpet that is loose or worn. Avoid having throw rugs at the top or bottom of the stairs. If you do have throw rugs, attach them to the floor with carpet tape. Make sure that you have a light switch at the top of the stairs and the bottom of the stairs. If you do not have them, ask someone to add them for you. What else can I do to help prevent falls? Wear shoes that: Do not have high heels. Have rubber bottoms. Are comfortable and fit you well. Are closed at the toe. Do not wear sandals. If you use a stepladder: Make sure that it is fully opened. Do not climb a closed stepladder. Make sure that both sides of the stepladder are locked into place. Ask someone to hold it for you, if possible. Clearly mark and make sure that you  can see: Any grab bars or handrails. First and last steps. Where the edge of each step is. Use tools that help you move around (mobility aids) if they are needed. These include: Canes. Walkers. Scooters. Crutches. Turn on the lights when you go into a dark area. Replace any light bulbs as soon as they burn out. Set up your furniture so you have a clear path. Avoid moving your furniture around. If any of your floors are uneven, fix them. If there are any pets around you, be aware of where they are. Review your medicines with your doctor. Some medicines can make you feel dizzy. This can increase your chance of falling. Ask your doctor what other things that you can do to help prevent falls. This information is not intended to replace advice given to you by your health care provider. Make sure you discuss any questions you have with your health care provider. Document Released: 05/25/2009 Document Revised: 01/04/2016 Document Reviewed: 09/02/2014 Elsevier Interactive Patient Education  2017 [employer].

## 2022-06-20 NOTE — Progress Notes (Deleted)
Subjective:   Jocelyn [name] is a 88 y.o. female who presents for an Initial Medicare Annual Wellness Visit.  Review of Systems    *** Cardiac Risk Factors include: advanced age (>81mn, >>26women);family history of premature cardiovascular disease;dyslipidemia;hypertension     Objective:    Today's Vitals   06/20/22 1356  Weight: 135 lb (61.2 kg)  Height: '5\' 6"'$  (1.676 m)  PainSc: 0-No pain   Body mass index is 21.79 kg/m.     06/20/2022    2:05 PM 06/14/2021    2:58 PM 06/29/2020    1:02 PM 06/19/2020    4:13 PM 02/16/2020    3:19 PM 12/08/2017   11:40 AM 03/11/2016    9:56 AM  Advanced Directives  Does Patient Have a Medical Advance Directive? No Yes No No No No Yes  Type of Advance Directive  Living will;Healthcare Power of Attorney       Does patient want to make changes to medical advance directive?  No - Patient declined   Yes (MAU/Ambulatory/Procedural Areas - Information given)    Copy of [place] Chart?  No - copy requested     Yes  Would patient like information on creating a medical advance directive? No - Patient declined  No - Patient declined No - Patient declined  Yes (ED - Information included in AVS)     Current Medications (verified) Outpatient Encounter Medications as of 06/20/2022  Medication Sig  . atorvastatin (LIPITOR) 40 MG tablet TAKE ONE TABLET BY MOUTH ONE TIME DAILY  . clopidogrel (PLAVIX) 75 MG tablet TAKE ONE TABLET BY MOUTH ONE TIME DAILY  . collagenase (SANTYL) ointment Apply 1 application topically daily.  . Cyanocobalamin (B-12 COMPLIANCE INJECTION IJ) Inject 1 Dose as directed every 30 (thirty) days.  . diazepam (VALIUM) 2 MG tablet Take 1 tablet (2 mg total) by mouth every 8 (eight) hours as needed for anxiety.  . DULoxetine (CYMBALTA) 60 MG capsule TAKE ONE CAPSULE BY MOUTH ONE TIME DAILY  . memantine (NAMENDA) 10 MG tablet TAKE ONE TABLET BY MOUTH TWICE DAILY  . solifenacin (VESICARE) 10 MG tablet TAKE ONE TABLET BY  MOUTH ONE TIME DAILY  . white petrolatum (VASELINE) GEL Apply 1 application. topically as needed for lip care.   No facility-administered encounter medications on file as of 06/20/2022.    Allergies (verified) Codeine and Tape   History: Past Medical History:  Diagnosis Date  . HTN (hypertension)   . Hyperlipidemia   . Urinary incontinence    Past Surgical History:  Procedure Laterality Date  . ABDOMINAL HYSTERECTOMY    . TONSILLECTOMY     Family History  Problem Relation Age of Onset  . Diabetes Other        1st degree relative  . Hyperlipidemia Other   . Stroke Other        1st degree Female relative <50  . Cancer Neg Hx   . Early death Neg Hx   . Heart disease Neg Hx   . Hypertension Neg Hx   . Kidney disease Neg Hx    Social History   Socioeconomic History  . Marital status: Widowed    Spouse name: Not on file  . Number of children: 1  . Years of education: Not on file  . Highest education level: Not on file  Occupational History  . Occupation: Retired  Tobacco Use  . Smoking status: Former  . Smokeless tobacco: Former    Quit date: 03/17/2017  .  Tobacco comments:    STATES SHE QUIT 25 YEARS AGO  Vaping Use  . Vaping Use: Never used  Substance and Sexual Activity  . Alcohol use: Yes    Alcohol/week: 14.0 standard drinks of alcohol    Types: 7 Glasses of wine, 7 Cans of beer per week  . Drug use: No  . Sexual activity: Never    Birth control/protection: Surgical  Other Topics Concern  . Not on file  Social History Narrative   Regular Exercise-no         Social Determinants of Health   Financial Resource Strain: Low Risk  (06/14/2021)   Overall Financial Resource Strain (CARDIA)   . Difficulty of Paying Living Expenses: Not hard at all  Food Insecurity: No Food Insecurity (06/20/2022)   Hunger Vital Sign   . Worried About Charity fundraiser in the Last Year: Never true   . Ran Out of Food in the Last Year: Never true  Transportation Needs: No  Transportation Needs (06/20/2022)   PRAPARE - Transportation   . Lack of Transportation (Medical): No   . Lack of Transportation (Non-Medical): No  Physical Activity: Inactive (06/20/2022)   Exercise Vital Sign   . Days of Exercise per Week: 0 days   . Minutes of Exercise per Session: 0 min  Stress: No Stress Concern Present (06/20/2022)   [place]   . Feeling of Stress : Not at all  Social Connections: Moderately Integrated (06/20/2022)   Social Connection and Isolation Panel [NHANES]   . Frequency of Communication with Friends and Family: More than three times a week   . Frequency of Social Gatherings with Friends and Family: More than three times a week   . Attends Religious Services: More than 4 times per year   . Active Member of Clubs or Organizations: Yes   . Attends Archivist Meetings: More than 4 times per year   . Marital Status: Widowed    Tobacco Counseling Counseling given: Not Answered Tobacco comments: STATES SHE QUIT 25 YEARS AGO   Clinical Intake:  Pre-visit preparation completed: Yes  Pain : No/denies pain Pain Score: 0-No pain     BMI - recorded: 21.79 Nutritional Status: BMI of 19-24  Normal Nutritional Risks: None Diabetes: Yes  How often do you need to have someone help you when you read instructions, pamphlets, or other written materials from your doctor or pharmacy?: 1 - Never What is the last grade level you completed in school?: HSG  Diabetic?***  Interpreter Needed?: No  Information entered by :: Lisette Abu, LPN.   Activities of Daily Living    06/20/2022    2:07 PM  In your present state of health, do you have any difficulty performing the following activities:  Hearing? 0  Vision? 0  Difficulty concentrating or making decisions? 0  Walking or climbing stairs? 0  Dressing or bathing? 0  Doing errands, shopping? 0  Preparing Food and eating ? N   Using the Toilet? N  In the past six months, have you accidently leaked urine? N  Do you have problems with loss of bowel control? N  Managing your Medications? N  Managing your Finances? N  Housekeeping or managing your Housekeeping? N    Patient Care Team: Janith Lima, MD as PCP - General Szabat, Darnelle Maffucci, [facility] (Inactive) (Pharmacist) Szabat, Darnelle Maffucci, [facility] (Inactive) as Pharmacist (Pharmacist) Calvert Cantor, MD as Consulting Physician (Ophthalmology)  Indicate  any recent Medical Services you may have received from other than [facility] providers in the past year (date may be approximate).     Assessment:   This is a routine wellness examination for [facility].  Hearing/Vision screen Hearing Screening - Comments:: Denies hearing difficulties   Vision Screening - Comments:: Wears rx glasses - up to date with routine eye exams with  [facility]   Dietary issues and exercise activities discussed: Current Exercise Habits: The patient does not participate in regular exercise at present, Exercise limited by: orthopedic condition(s)   Goals Addressed            This Visit's Progress   . Patient Stated       To maintain my health and independence.     Depression Screen    06/20/2022    2:02 PM 03/11/2022    9:23 AM 06/14/2021    2:56 PM 01/26/2021    2:07 PM 06/26/2020   11:56 AM 02/16/2020    3:20 PM 01/25/2020   11:34 AM  PHQ 2/9 Scores  PHQ - 2 Score 0 2 0  2 0 6  PHQ- 9 Score  '12   10  17  '$ Exception Documentation    Patient refusal       Fall Risk    06/20/2022    2:06 PM 06/14/2021    2:59 PM 06/26/2020   10:39 AM 02/16/2020    3:20 PM 01/25/2020   11:34 AM  Fall Risk   Falls in the past year? 1 1 0 0 0  Number falls in past yr: 1 0 1 0 0  Injury with Fall? '1 1 1 '$ 0 0  Risk for fall due to : History of fall(s);Impaired balance/gait;Orthopedic patient Impaired balance/gait Impaired balance/gait;Impaired mobility No Fall Risks No Fall Risks  Follow up Education  provided;Falls prevention discussed Falls evaluation completed Falls evaluation completed Falls evaluation completed Falls evaluation completed    FALL RISK PREVENTION PERTAINING TO THE HOME:  Any stairs in or around the home? {YES/NO:21197} If so, are there any without handrails? {YES/NO:21197} Home free of loose throw rugs in walkways, pet beds, electrical cords, etc? {YES/NO:21197} Adequate lighting in your home to reduce risk of falls? {YES/NO:21197}  ASSISTIVE DEVICES UTILIZED TO PREVENT FALLS:  Life alert? {YES/NO:21197} Use of a cane, walker or w/c? {YES/NO:21197} Grab bars in the bathroom? {YES/NO:21197} Shower chair or bench in shower? {YES/NO:21197} Elevated toilet seat or a handicapped toilet? {YES/NO:21197}  TIMED UP AND GO:  Was the test performed? {YES/NO:21197}.  Length of time to ambulate 10 feet: *** sec.   {Appearance of YEMV:3612244}  Cognitive Function:    10/25/2021    1:34 PM 04/23/2021    1:22 PM 10/17/2020   10:06 AM 06/19/2020    9:57 AM 12/08/2017   11:50 AM  MMSE - Mini Mental State Exam  Orientation to time '3 4 3 4 5  '$ Orientation to Place '5 4 5 5 5  '$ Registration '3 3 3 3 3  '$ Attention/ Calculation 0 0 '2 1 5  '$ Recall '2 2 1 2 3  '$ Language- name 2 objects '2 2 2 2 2  '$ Language- repeat 0 0 '1 1 1  '$ Language- follow 3 step command '3 3 3 3 3  '$ Language- read & follow direction '1 1 1 1 1  '$ Write a sentence '1 1 1 1 1  '$ Copy design 0 0 '1 1 1  '$ Copy design-comments    6 animals   Total score 20 20  $'23 24 30        'k$ 06/20/2022    2:08 PM  6CIT Screen  What Year? 0 points  What month? 0 points  What time? 0 points  Count back from 20 0 points  Months in reverse 0 points  Repeat phrase 0 points  Total Score 0 points    Immunizations Immunization History  Administered Date(s) Administered  . Fluad Quad(high Dose 65+) 05/15/2020, 05/14/2022  . Influenza Whole 05/22/2010, 06/12/2012  . Influenza, High Dose Seasonal PF 05/28/2017  . Influenza-Unspecified  06/05/2016, 04/08/2018, 06/05/2021  . Moderna Covid-19 Vaccine Bivalent Booster 39yr & up 05/18/2021  . Moderna Sars-Covid-2 Vaccination 12/28/2019, 01/12/2020  . PFIZER Comirnaty(Gray Top)Covid-19 Tri-Sucrose Vaccine 05/28/2022  . PNEUMOCOCCAL CONJUGATE-20 05/18/2021  . PPD Test 03/06/2022  . Pneumococcal Conjugate-13 06/09/2013  . Pneumococcal Polysaccharide-23 03/18/2017  . Td 09/20/2010  . Tdap 06/19/2020  . Zoster, Live 09/20/2010    {TDAP status:2101805}  {Flu Vaccine status:2101806}  {Pneumococcal vaccine status:2101807}  {Covid-19 vaccine status:2101808}  Qualifies for Shingles Vaccine? {YES/NO:21197}  Zostavax completed {YES/NO:21197}  {Shingrix Completed?:2101804}  Screening Tests Health Maintenance  Topic Date Due  . Zoster Vaccines- Shingrix (1 of 2) Never done  . COVID-19 Vaccine (4 - Moderna series) 09/28/2022  . Medicare Annual Wellness (AWV)  06/21/2023  . TETANUS/TDAP  06/19/2030  . Pneumonia Vaccine 88 Years old  Completed  . INFLUENZA VACCINE  Completed  . DEXA SCAN  Completed  . HPV VACCINES  Aged Out    Health Maintenance  Health Maintenance Due  Topic Date Due  . Zoster Vaccines- Shingrix (1 of 2) Never done    {Colorectal cancer screening:2101809}  {Mammogram status:21018020}  {Bone Density status:21018021}  Lung Cancer Screening: (Low Dose CT Chest recommended if Age 86-80years, 30 pack-year currently smoking OR have quit w/in 15years.) {DOES NOT does:27190::"does not"} qualify.   Lung Cancer Screening Referral: ***  Additional Screening:  Hepatitis C Screening: {DOES NOT does:27190::"does not"} qualify; Completed ***  Vision Screening: Recommended annual ophthalmology exams for early detection of glaucoma and other disorders of the eye. Is the patient up to date with their annual eye exam?  {YES/NO:21197} Who is the provider or what is the name of the office in which the patient attends annual eye exams? *** If pt is not  established with a provider, would they like to be referred to a provider to establish care? {YES/NO:21197}.   Dental Screening: Recommended annual dental exams for proper oral hygiene  Community Resource Referral / Chronic Care Management: CRR required this visit?  {YES/NO:21197}  CCM required this visit?  {YES/NO:21197}     Plan:     I have personally reviewed and noted the following in the patient's chart:   Medical and social history Use of alcohol, tobacco or illicit drugs  Current medications and supplements including opioid prescriptions. {Opioid [phone]} Functional ability and status Nutritional status Physical activity Advanced directives List of other physicians Hospitalizations, surgeries, and ER visits in previous 12 months Vitals Screenings to include cognitive, depression, and falls Referrals and appointments  In addition, I have reviewed and discussed with patient certain preventive protocols, quality metrics, and best practice recommendations. A written personalized care plan for preventive services as well as general preventive health recommendations were provided to patient.     SSheral Flow LPN   [id number]  Nurse Notes: ***

## 2022-06-20 NOTE — Progress Notes (Deleted)
Subjective:   Jocelyn [name] is a 88 y.o. female who presents for Medicare Annual (Subsequent) preventive examination.  Review of Systems     Cardiac Risk Factors include: advanced age (>69mn, >>21women);family history of premature cardiovascular disease;dyslipidemia;hypertension     Objective:    Today's Vitals   06/20/22 1356  Weight: 135 lb (61.2 kg)  Height: '5\' 6"'$  (1.676 m)  PainSc: 0-No pain   Body mass index is 21.79 kg/m.     06/20/2022    2:05 PM 06/14/2021    2:58 PM 06/29/2020    1:02 PM 06/19/2020    4:13 PM 02/16/2020    3:19 PM 12/08/2017   11:40 AM 03/11/2016    9:56 AM  Advanced Directives  Does Patient Have a Medical Advance Directive? No Yes No No No No Yes  Type of Advance Directive  Living will;Healthcare Power of Attorney       Does patient want to make changes to medical advance directive?  No - Patient declined   Yes (MAU/Ambulatory/Procedural Areas - Information given)    Copy of [place] Chart?  No - copy requested     Yes  Would patient like information on creating a medical advance directive? No - Patient declined  No - Patient declined No - Patient declined  Yes (ED - Information included in AVS)     Current Medications (verified) Outpatient Encounter Medications as of 06/20/2022  Medication Sig   atorvastatin (LIPITOR) 40 MG tablet TAKE ONE TABLET BY MOUTH ONE TIME DAILY   clopidogrel (PLAVIX) 75 MG tablet TAKE ONE TABLET BY MOUTH ONE TIME DAILY   collagenase (SANTYL) ointment Apply 1 application topically daily.   Cyanocobalamin (B-12 COMPLIANCE INJECTION IJ) Inject 1 Dose as directed every 30 (thirty) days.   diazepam (VALIUM) 2 MG tablet Take 1 tablet (2 mg total) by mouth every 8 (eight) hours as needed for anxiety.   DULoxetine (CYMBALTA) 60 MG capsule TAKE ONE CAPSULE BY MOUTH ONE TIME DAILY   memantine (NAMENDA) 10 MG tablet TAKE ONE TABLET BY MOUTH TWICE DAILY   solifenacin (VESICARE) 10 MG tablet TAKE ONE TABLET BY  MOUTH ONE TIME DAILY   white petrolatum (VASELINE) GEL Apply 1 application. topically as needed for lip care.   No facility-administered encounter medications on file as of 06/20/2022.    Allergies (verified) Codeine and Tape   History: Past Medical History:  Diagnosis Date   HTN (hypertension)    Hyperlipidemia    Urinary incontinence    Past Surgical History:  Procedure Laterality Date   ABDOMINAL HYSTERECTOMY     TONSILLECTOMY     Family History  Problem Relation Age of Onset   Diabetes Other        1st degree relative   Hyperlipidemia Other    Stroke Other        1st degree Female relative <50   Cancer Neg Hx    Early death Neg Hx    Heart disease Neg Hx    Hypertension Neg Hx    Kidney disease Neg Hx    Social History   Socioeconomic History   Marital status: Widowed    Spouse name: Not on file   Number of children: 1   Years of education: Not on file   Highest education level: Not on file  Occupational History   Occupation: Retired  Tobacco Use   Smoking status: Former   Smokeless tobacco: Former    Quit date: 03/17/2017  Tobacco comments:    STATES SHE QUIT 25 YEARS AGO  Vaping Use   Vaping Use: Never used  Substance and Sexual Activity   Alcohol use: Yes    Alcohol/week: 14.0 standard drinks of alcohol    Types: 7 Glasses of wine, 7 Cans of beer per week   Drug use: No   Sexual activity: Never    Birth control/protection: Surgical  Other Topics Concern   Not on file  Social History Narrative   Regular Exercise-no         Social Determinants of Health   Financial Resource Strain: Low Risk  (06/14/2021)   Overall Financial Resource Strain (CARDIA)    Difficulty of Paying Living Expenses: Not hard at all  Food Insecurity: No Food Insecurity (06/20/2022)   Hunger Vital Sign    Worried About Running Out of Food in the Last Year: Never true    Ran Out of Food in the Last Year: Never true  Transportation Needs: No Transportation Needs (06/20/2022)    PRAPARE - Hydrologist (Medical): No    Lack of Transportation (Non-Medical): No  Physical Activity: Inactive (06/20/2022)   Exercise Vital Sign    Days of Exercise per Week: 0 days    Minutes of Exercise per Session: 0 min  Stress: No Stress Concern Present (06/20/2022)   [place]    Feeling of Stress : Not at all  Social Connections: Moderately Integrated (06/20/2022)   Social Connection and Isolation Panel [NHANES]    Frequency of Communication with Friends and Family: More than three times a week    Frequency of Social Gatherings with Friends and Family: More than three times a week    Attends Religious Services: More than 4 times per year    Active Member of [employer] or Organizations: Yes    Attends Archivist Meetings: More than 4 times per year    Marital Status: Widowed    Tobacco Counseling Counseling given: Not Answered Tobacco comments: STATES SHE QUIT 25 YEARS AGO   Clinical Intake:  Pre-visit preparation completed: Yes  Pain : No/denies pain Pain Score: 0-No pain     BMI - recorded: 21.79 Nutritional Status: BMI of 19-24  Normal Nutritional Risks: None Diabetes: Yes  How often do you need to have someone help you when you read instructions, pamphlets, or other written materials from your doctor or pharmacy?: 1 - Never What is the last grade level you completed in school?: HSG  Diabetic? no  Interpreter Needed?: No  Information entered by :: Lisette Abu, LPN.   Activities of Daily Living    06/20/2022    2:07 PM  In your present state of health, do you have any difficulty performing the following activities:  Hearing? 0  Vision? 0  Difficulty concentrating or making decisions? 0  Walking or climbing stairs? 0  Dressing or bathing? 0  Doing errands, shopping? 0  Preparing Food and eating ? N  Using the Toilet? N  In the past six months,  have you accidently leaked urine? N  Do you have problems with loss of bowel control? N  Managing your Medications? N  Managing your Finances? N  Housekeeping or managing your Housekeeping? N    Patient Care Team: Janith Lima, MD as PCP - General Szabat, Darnelle Maffucci, [facility] (Inactive) (Pharmacist) Szabat, Darnelle Maffucci, [facility] (Inactive) as Pharmacist (Pharmacist) Calvert Cantor, MD as Consulting Physician (Ophthalmology)  Indicate any recent Medical Services you may have received from other than [facility] providers in the past year (date may be approximate).     Assessment:   This is a routine wellness examination for [facility].  Hearing/Vision screen Hearing Screening - Comments:: Denies hearing difficulties   Vision Screening - Comments:: Wears rx glasses - up to date with routine eye exams with  [facility]   Dietary issues and exercise activities discussed: Current Exercise Habits: The patient does not participate in regular exercise at present, Exercise limited by: orthopedic condition(s)   Goals Addressed             This Visit's Progress    Patient Stated       To maintain my health and independence.      Depression Screen    06/20/2022    2:02 PM 03/11/2022    9:23 AM 06/14/2021    2:56 PM 01/26/2021    2:07 PM 06/26/2020   11:56 AM 02/16/2020    3:20 PM 01/25/2020   11:34 AM  PHQ 2/9 Scores  PHQ - 2 Score 0 2 0  2 0 6  PHQ- 9 Score  '12   10  17  '$ Exception Documentation    Patient refusal       Fall Risk    06/20/2022    2:06 PM 06/14/2021    2:59 PM 06/26/2020   10:39 AM 02/16/2020    3:20 PM 01/25/2020   11:34 AM  Fall Risk   Falls in the past year? 1 1 0 0 0  Number falls in past yr: 1 0 1 0 0  Injury with Fall? '1 1 1 '$ 0 0  Risk for fall due to : History of fall(s);Impaired balance/gait;Orthopedic patient Impaired balance/gait Impaired balance/gait;Impaired mobility No Fall Risks No Fall Risks  Follow up Education provided;Falls prevention discussed Falls  evaluation completed Falls evaluation completed Falls evaluation completed Falls evaluation completed    FALL RISK PREVENTION PERTAINING TO THE HOME:  Any stairs in or around the home? No  If so, are there any without handrails? No  Home free of loose throw rugs in walkways, pet beds, electrical cords, etc? Yes  Adequate lighting in your home to reduce risk of falls? Yes   ASSISTIVE DEVICES UTILIZED TO PREVENT FALLS:  Life alert? No  Use of a cane, walker or w/c? Yes  Grab bars in the bathroom? Yes  Shower chair or bench in shower? Yes  Elevated toilet seat or a handicapped toilet? Yes   TIMED UP AND GO:  Was the test performed? No . Phone Visit   Cognitive Function:    10/25/2021    1:34 PM 04/23/2021    1:22 PM 10/17/2020   10:06 AM 06/19/2020    9:57 AM 12/08/2017   11:50 AM  MMSE - Mini Mental State Exam  Orientation to time '3 4 3 4 5  '$ Orientation to Place '5 4 5 5 5  '$ Registration '3 3 3 3 3  '$ Attention/ Calculation 0 0 '2 1 5  '$ Recall '2 2 1 2 3  '$ Language- name 2 objects '2 2 2 2 2  '$ Language- repeat 0 0 '1 1 1  '$ Language- follow 3 step command '3 3 3 3 3  '$ Language- read & follow direction '1 1 1 1 1  '$ Write a sentence '1 1 1 1 1  '$ Copy design 0 0 '1 1 1  '$ Copy design-comments    6 animals   Total score 20  $'20 23 24 30        'u$ 06/20/2022    2:08 PM  6CIT Screen  What Year? 0 points  What month? 0 points  What time? 0 points  Count back from 20 0 points  Months in reverse 0 points  Repeat phrase 0 points  Total Score 0 points    Immunizations Immunization History  Administered Date(s) Administered   Fluad Quad(high Dose 65+) 05/15/2020, 05/14/2022   Influenza Whole 05/22/2010, 06/12/2012   Influenza, High Dose Seasonal PF 05/28/2017   Influenza-Unspecified 06/05/2016, 04/08/2018, 06/05/2021   Moderna Covid-19 Vaccine Bivalent Booster 58yr & up 05/18/2021   Moderna Sars-Covid-2 Vaccination 12/28/2019, 01/12/2020   PFIZER Comirnaty(Gray Top)Covid-19 Tri-Sucrose Vaccine  05/28/2022   PNEUMOCOCCAL CONJUGATE-20 05/18/2021   PPD Test 03/06/2022   Pneumococcal Conjugate-13 06/09/2013   Pneumococcal Polysaccharide-23 03/18/2017   Td 09/20/2010   Tdap 06/19/2020   Zoster, Live 09/20/2010    TDAP status: Up to date  Flu Vaccine status: Up to date  Pneumococcal vaccine status: Up to date  Covid-19 vaccine status: Completed vaccines  Qualifies for Shingles Vaccine? Yes   Zostavax completed Yes   Shingrix Completed?: No.    Education has been provided regarding the importance of this vaccine. Patient has been advised to call insurance company to determine out of pocket expense if they have not yet received this vaccine. Advised may also receive vaccine at local pharmacy or Health Dept. Verbalized acceptance and understanding.  Screening Tests Health Maintenance  Topic Date Due   Zoster Vaccines- Shingrix (1 of 2) Never done   COVID-19 Vaccine (4 - Moderna series) 09/28/2022   Medicare Annual Wellness (AWV)  06/21/2023   TETANUS/TDAP  06/19/2030   Pneumonia Vaccine 88 Years old  Completed   INFLUENZA VACCINE  Completed   DEXA SCAN  Completed   HPV VACCINES  Aged Out    Health Maintenance  Health Maintenance Due  Topic Date Due   Zoster Vaccines- Shingrix (1 of 2) Never done    Colorectal cancer screening: No longer required.   Mammogram status: No longer required due to age.  Bone density status: never done  Lung Cancer Screening: (Low Dose CT Chest recommended if Age 667-80years, 30 pack-year currently smoking OR have quit w/in 15years.) does not qualify.   Lung Cancer Screening Referral: no  Additional Screening:  Hepatitis C Screening: does not qualify; Completed no  Vision Screening: Recommended annual ophthalmology exams for early detection of glaucoma and other disorders of the eye. Is the patient up to date with their annual eye exam?  Yes  Who is the provider or what is the name of the office in which the patient attends annual  [facility] exams[facility] not [facility] a provider, would they like to be referred to a provider to establish care? No .   Dental Screening: Recommended annual dental exams for proper oral hygiene  Community Resource Referral / Chronic Care Management: CRR required this visit?  No   CCM required this visit?  No      Plan:     I have personally reviewed and noted the following in the patient's chart:   Medical and social history Use of alcohol, tobacco or illicit drugs  Current medications and supplements including opioid prescriptions. Patient is not currently taking opioid prescriptions. Functional ability and status Nutritional status Physical activity Advanced directives List of other physicians Hospitalizations, surgeries, and ER visits in previous 12 months Vitals Screenings to include  cognitive, depression, and falls Referrals and appointments  In addition, I have reviewed and discussed with patient certain preventive protocols, quality metrics, and best practice recommendations. A written personalized care plan for preventive services as well as general preventive health recommendations were provided to patient.     Sheral Flow, LPN   58/10/938   Nurse Notes: n/a

## 2022-06-26 DIAGNOSIS — N182 Chronic kidney disease, stage 2 (mild): Secondary | ICD-10-CM | POA: Diagnosis not present

## 2022-06-26 DIAGNOSIS — I129 Hypertensive chronic kidney disease with stage 1 through stage 4 chronic kidney disease, or unspecified chronic kidney disease: Secondary | ICD-10-CM | POA: Diagnosis not present

## 2022-06-26 DIAGNOSIS — F0393 Unspecified dementia, unspecified severity, with mood disturbance: Secondary | ICD-10-CM | POA: Diagnosis not present

## 2022-06-26 DIAGNOSIS — M159 Polyosteoarthritis, unspecified: Secondary | ICD-10-CM | POA: Diagnosis not present

## 2022-06-26 DIAGNOSIS — I700 Atherosclerosis of aorta: Secondary | ICD-10-CM | POA: Diagnosis not present

## 2022-06-26 DIAGNOSIS — F322 Major depressive disorder, single episode, severe without psychotic features: Secondary | ICD-10-CM | POA: Diagnosis not present

## 2022-07-02 DIAGNOSIS — I700 Atherosclerosis of aorta: Secondary | ICD-10-CM | POA: Diagnosis not present

## 2022-07-02 DIAGNOSIS — I129 Hypertensive chronic kidney disease with stage 1 through stage 4 chronic kidney disease, or unspecified chronic kidney disease: Secondary | ICD-10-CM | POA: Diagnosis not present

## 2022-07-02 DIAGNOSIS — M159 Polyosteoarthritis, unspecified: Secondary | ICD-10-CM | POA: Diagnosis not present

## 2022-07-02 DIAGNOSIS — N182 Chronic kidney disease, stage 2 (mild): Secondary | ICD-10-CM | POA: Diagnosis not present

## 2022-07-02 DIAGNOSIS — F0393 Unspecified dementia, unspecified severity, with mood disturbance: Secondary | ICD-10-CM | POA: Diagnosis not present

## 2022-07-02 DIAGNOSIS — F322 Major depressive disorder, single episode, severe without psychotic features: Secondary | ICD-10-CM | POA: Diagnosis not present

## 2022-07-09 DIAGNOSIS — G301 Alzheimer's disease with late onset: Secondary | ICD-10-CM | POA: Diagnosis not present

## 2022-07-09 DIAGNOSIS — N1831 Chronic kidney disease, stage 3a: Secondary | ICD-10-CM | POA: Diagnosis not present

## 2022-07-09 DIAGNOSIS — E782 Mixed hyperlipidemia: Secondary | ICD-10-CM | POA: Diagnosis not present

## 2022-07-09 DIAGNOSIS — E559 Vitamin D deficiency, unspecified: Secondary | ICD-10-CM | POA: Diagnosis not present

## 2022-07-09 DIAGNOSIS — F0280 Dementia in other diseases classified elsewhere without behavioral disturbance: Secondary | ICD-10-CM | POA: Diagnosis not present

## 2022-07-09 DIAGNOSIS — I129 Hypertensive chronic kidney disease with stage 1 through stage 4 chronic kidney disease, or unspecified chronic kidney disease: Secondary | ICD-10-CM | POA: Diagnosis not present

## 2022-07-11 DIAGNOSIS — N182 Chronic kidney disease, stage 2 (mild): Secondary | ICD-10-CM | POA: Diagnosis not present

## 2022-07-11 DIAGNOSIS — M159 Polyosteoarthritis, unspecified: Secondary | ICD-10-CM | POA: Diagnosis not present

## 2022-07-11 DIAGNOSIS — I129 Hypertensive chronic kidney disease with stage 1 through stage 4 chronic kidney disease, or unspecified chronic kidney disease: Secondary | ICD-10-CM | POA: Diagnosis not present

## 2022-07-11 DIAGNOSIS — F322 Major depressive disorder, single episode, severe without psychotic features: Secondary | ICD-10-CM | POA: Diagnosis not present

## 2022-07-11 DIAGNOSIS — I700 Atherosclerosis of aorta: Secondary | ICD-10-CM | POA: Diagnosis not present

## 2022-07-11 DIAGNOSIS — F0393 Unspecified dementia, unspecified severity, with mood disturbance: Secondary | ICD-10-CM | POA: Diagnosis not present

## 2022-07-15 DIAGNOSIS — F322 Major depressive disorder, single episode, severe without psychotic features: Secondary | ICD-10-CM | POA: Diagnosis not present

## 2022-07-15 DIAGNOSIS — N182 Chronic kidney disease, stage 2 (mild): Secondary | ICD-10-CM | POA: Diagnosis not present

## 2022-07-15 DIAGNOSIS — M159 Polyosteoarthritis, unspecified: Secondary | ICD-10-CM | POA: Diagnosis not present

## 2022-07-15 DIAGNOSIS — I129 Hypertensive chronic kidney disease with stage 1 through stage 4 chronic kidney disease, or unspecified chronic kidney disease: Secondary | ICD-10-CM | POA: Diagnosis not present

## 2022-07-15 DIAGNOSIS — F0393 Unspecified dementia, unspecified severity, with mood disturbance: Secondary | ICD-10-CM | POA: Diagnosis not present

## 2022-07-15 DIAGNOSIS — I700 Atherosclerosis of aorta: Secondary | ICD-10-CM | POA: Diagnosis not present

## 2022-07-18 DIAGNOSIS — D631 Anemia in chronic kidney disease: Secondary | ICD-10-CM | POA: Diagnosis not present

## 2022-07-18 DIAGNOSIS — F331 Major depressive disorder, recurrent, moderate: Secondary | ICD-10-CM | POA: Diagnosis not present

## 2022-07-18 DIAGNOSIS — Z9181 History of falling: Secondary | ICD-10-CM | POA: Diagnosis not present

## 2022-07-18 DIAGNOSIS — E782 Mixed hyperlipidemia: Secondary | ICD-10-CM | POA: Diagnosis not present

## 2022-07-18 DIAGNOSIS — G301 Alzheimer's disease with late onset: Secondary | ICD-10-CM | POA: Diagnosis not present

## 2022-07-18 DIAGNOSIS — F0280 Dementia in other diseases classified elsewhere without behavioral disturbance: Secondary | ICD-10-CM | POA: Diagnosis not present

## 2022-07-18 DIAGNOSIS — E559 Vitamin D deficiency, unspecified: Secondary | ICD-10-CM | POA: Diagnosis not present

## 2022-08-01 DIAGNOSIS — R0981 Nasal congestion: Secondary | ICD-10-CM | POA: Diagnosis not present

## 2022-08-01 DIAGNOSIS — R0602 Shortness of breath: Secondary | ICD-10-CM | POA: Diagnosis not present

## 2022-08-01 DIAGNOSIS — R5081 Fever presenting with conditions classified elsewhere: Secondary | ICD-10-CM | POA: Diagnosis not present

## 2022-08-01 DIAGNOSIS — J328 Other chronic sinusitis: Secondary | ICD-10-CM | POA: Diagnosis not present

## 2022-08-01 DIAGNOSIS — R0789 Other chest pain: Secondary | ICD-10-CM | POA: Diagnosis not present

## 2022-08-01 DIAGNOSIS — R5383 Other fatigue: Secondary | ICD-10-CM | POA: Diagnosis not present

## 2022-11-06 NOTE — Progress Notes (Unsigned)
[facility] [address] [place]. [place] [address] [phone]       STROKE FOLLOW UP NOTE  [name] Date of Birth:  03-26-1932 Medical Record Number:  [id number]   Reason for Referral: stroke follow up    SUBJECTIVE:   CHIEF COMPLAINT:  No chief complaint on file.   HPI:   Update 11/07/2022 JM: Patient returns for yearly follow-up.  Stable from stroke standpoint without new stroke/TIA symptoms. Reports cognition ***  Compliant on Plavix and atorvastatin Blood pressure well-controlled Routinely follows with PCP        History provided for reference purposes only Update 10/25/2021 JM: 89 year old female with history of stroke and cognitive impairment returns for 61-month follow-up accompanied by her daughter, Jocelyn [name].  Stable from stroke standpoint without new stroke/TIA symptoms.  Cognition stable since prior visit.  She continues to live alone as she continues to decline moving to assisted living or living with family although very supportive family who assists as needed and checks on her frequently.  Able to maintain ADLs independently and some IADLs, daughter continues to assist with medications, bill paying and transportation. Per daughter, patient does not take her medications routinely either she forgets or refuses to take.  Sleeping prolonged hours typically 12 to 15 hours per day. Decreased motivation, concern of underlying depression and anxiety.  Currently prescribed duloxetine and Namenda but again, intermittent use. She continues to have falls but thankfully without injury. She will occasionally use her life alert button.  On Plavix and atorvastatin, denies side effects.  Blood pressure today 153/91.  Not routinely monitor at home.  No further concerns at this time.   Update 04/23/2021 JM: [name] returns for 42-month follow-up regarding history of stroke and cognitive deficit accompanied by daughter. Does have short term memory  difficulties.  Continues to live alone with family checking on her frequently but family becoming more concerned about the safety of her continuing to live alone.  She is resistant to moving into an assisted living or getting additional assistance at home.  Daughter assists with organizing pills in pill box but at times, patient will forget to take medications. Remains on Namenda but daughter questioning benefit as sge believes cognition has been gradually declining.  She will occasionally do word finding puzzles but otherwise relatively sedentary.  Maintains ADLs and majority of IADls - daughter assist with medications and bill paying. Also assists with transpiration as patient does not drive. Continues to use RW outdoors - does not always use in home.  Does report a couple of falls in the home thankfully without injury. Stable from stroke standpoint without new stroke/TIA symptoms.  Remains on Plavix and atorvastatin.  Blood pressure today 124/83.  No further concerns at this time  Update 10/17/2020 JM: [name] returns for 67-month scheduled follow-up with prior stroke history and cognitive decline.  She is accompanied by her daughter, Jocelyn [name]  Residual stroke deficits of cognitive impairment and imbalance Denies new stroke/TIA symptoms  At prior visit, concern for worsening gait with frequent falls and cognitive decline - MRI w/wo negative for acute abnormalities, EEG negative, dementia panel within normal limits except slightly low B12 level (although still WNL) with known B12 deficiency She has not had any additional falls and continues to use rolling walker for assistance -previously using cane  She continues to live independently and maintains ADLs independently Daughter assists with majority of IADL's Does not currently drive which she is frustrated about Per daughter, she has noted a  continued decline in cognition specifically with short-term memory She remains sedentary watching TV majority of  the day -patient reports activity and exercise during the day but per daughter, this is not accurate Per daughter, occasional agitation and increased confusion such as believing her husband is with her in her home - she denies actually seeing him but "feels his presence"  On Namenda 10mg  twice daily tolerating without side effects On Cymbalta per PCP for depression/anxiety with noted improvement  MMSE today 23/30 (prior 23/30)  Reports compliance on Plavix and atorvastatin -denies side effects Blood pressure today 134/85  No further concerns at this time  Update 06/19/2020 JM: [name] returns for stroke follow-up accompanied by her granddaughter.  Granddaughter sent my chart message on 06/18/2020 with concerns of frequent falls and gradual worsening cognition over the past 3 months (see MyChart message 11/7). Per granddaughter, she has had multiple falls with 2 falls over the past month with fall on 10/12 apparently in the middle of the night but did not call her granddaughter until later the following day. Per granddaughter, multiple lacerations including back of her head on the left side and 3 areas in her left arm.  She refused to be evaluated at that time.  She had an additional fall recently thankfully without injury.  She is currently living independently but does have family checking on her frequently and cameras throughout the house but granddaughter has appropriate safety concerns as she is unaware of her physical and cognitive deficits.  Granddaughter attempted to place her in ALF with patient adamantly refused.  She will be agitated or irritable quickly, frequently ask where her husband is (who passed away from Alzheimer's disease) and become more confused in the evening or initially upon awakening in the morning.  Reports sleeping well throughout the night.  She does admit to depression/anxiety with PCP previously starting her on duloxetine 30 mg daily but denies any benefit.  Admits to  sedentary lifestyle with majority of her days spent watching TV.  Previously working with therapies but these were discontinued due to lack of participation and improvement.  Denies focal deficits such as speech difficulty, hemiparesis, visual changes or sensory changes.  She has remained on aspirin and Plavix despite 42-month recommendation without side effects.  Remains on atorvastatin 40 mg daily without myalgias.  Blood pressure today 147/90.  No further concerns at this time.  Initial visit 03/01/2020 JM: [name] is being seen for hospital follow-up accompanied by her granddaughter Jocelyn [name].  Residual deficits of mild imbalance and mild cognitive impairment which has been improving.  Currently working with home health therapy which is to be completed after additional 2 sessions.  She was evaluated by OT or SLP.  Previously living independently but currently staying with family due to safety concerns in regards to ambulation, performing ADLs and IADLs and cognitive impairment.  Currently only on Plavix and discontinued aspirin after 3 weeks (per granddaughter, this was advised at hospital discharge).  Denies bleeding or bruising currently or while on DAPT.  Continues on atorvastatin without myalgias.  Blood pressure today 130/70.  No other concerns at this time.  Stroke admission 01/31/2020 Personally reviewed all hospital notes and pertinent lab work and imaging [name] is a 89 y.o. female with history of HTN, HLD  who presented on 01/31/2020 with waxing and waning confusion, left-sided visual loss and R side numbness.  Stroke work-up revealed right PCA infarct in setting of right PCA high-grade stenosis, infarct secondary to large vessel  disease source.  CTA head/neck diffuse intracranial stenosis (additional information below).  Recommended DAPT for 3 months then Plavix alone as previously on aspirin.  HTN stable.  LDL 123 initiate atorvastatin 40 mg daily.  Other stroke risk factors include  advanced age, former tobacco use, EtOH use, family history of stroke but no personal history of stroke.  Evaluated by therapy and recommended outpatient PT and discharged home in stable condition.  Stroke:   R PCA infarct in setting of R PCA high-grade stenosis, infarcts secondary to large vessel disease source MRI  Acute and subacute R occipital infarcts. Moderate small vessel disease.  CTA head & neck proximal R P2 high-grade stenosis associated w/ infarct. R P3 superior division occlusion. Moderate L P2 stenosis. L A1 high-grade stenosis. Moderate mid R frontoparietal branch stenosis RM3. B narrowing ACA and MCA branches. Supraclinoid R ICA 50%. B ICA bifurcation atherosclerosis. 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 No source of embolus  LDL 123 HgbA1c pending  aspirin 81 mg daily prior to admission, now on aspirin 81 mg daily and clopidogrel 75 mg daily. Continue DAPT x 3 months then plavix alone Therapy recommendations:  OP PT Disposition:  D/c home      ROS:   14 system review of systems performed and negative with exception of those listed in HPI  PMH:  Past Medical History:  Diagnosis Date   HTN (hypertension)    Hyperlipidemia    Urinary incontinence     PSH:  Past Surgical History:  Procedure Laterality Date   ABDOMINAL HYSTERECTOMY     TONSILLECTOMY      Social History:  Social History   Socioeconomic History   Marital status: Widowed    Spouse name: Not on file   Number of children: 1   Years of education: Not on file   Highest education level: Not on file  Occupational History   Occupation: Retired  Tobacco Use   Smoking status: Former   Smokeless tobacco: Former    Quit date: 03/17/2017   Tobacco comments:    [facility] 25 YEARS AGO  Vaping Use   Vaping Use: Never used  Substance and Sexual Activity   Alcohol use: Yes    Alcohol/week: 14.0 standard drinks of alcohol    Types: 7  Glasses of wine, 7 Cans of beer per week   Drug use: No   Sexual activity: Never    Birth control/protection: Surgical  Other Topics Concern   Not on file  Social History Narrative   Regular Exercise-no         Social Determinants of Health   Financial Resource Strain: Low Risk  (06/14/2021)   Overall Financial Resource Strain (CARDIA)    Difficulty of Paying Living Expenses: Not hard at all  Food Insecurity: No Food Insecurity (06/20/2022)   Hunger Vital Sign    Worried About Running Out of Food in the Last Year: Never true    Ran Out of Food in the Last Year: Never true  Transportation Needs: No Transportation Needs (06/20/2022)   PRAPARE - Hydrologist (Medical): No    Lack of Transportation (Non-Medical): No  Physical Activity: Inactive (06/20/2022)   Exercise Vital Sign    Days of Exercise per Week: 0 days    Minutes of Exercise per Session: 0 min  Stress: No Stress Concern Present (06/20/2022)   [place]  Stress Questionnaire    Feeling of Stress : Not at all  Social Connections: Moderately Integrated (06/20/2022)   Social Connection and Isolation Panel [NHANES]    Frequency of Communication with Friends and Family: More than three times a week    Frequency of Social Gatherings with Friends and Family: More than three times a week    Attends Religious Services: More than 4 times per year    Active Member of [employer] or Organizations: Yes    Attends Archivist Meetings: More than 4 times per year    Marital Status: Widowed  Intimate Partner Violence: Not At Risk (06/20/2022)   Humiliation, Afraid, Rape, and Kick questionnaire    Fear of Current or Ex-Partner: No    Emotionally Abused: No    Physically Abused: No    Sexually Abused: No    Family History:  Family History  Problem Relation Age of Onset   Diabetes Other        1st degree relative   Hyperlipidemia Other    Stroke Other         1st degree Female relative <50   Cancer Neg Hx    Early death Neg Hx    Heart disease Neg Hx    Hypertension Neg Hx    Kidney disease Neg Hx     Medications:   Current Outpatient Medications on File Prior to Visit  Medication Sig Dispense Refill   atorvastatin (LIPITOR) 40 MG tablet TAKE ONE TABLET BY MOUTH ONE TIME DAILY 90 tablet 0   clopidogrel (PLAVIX) 75 MG tablet TAKE ONE TABLET BY MOUTH ONE TIME DAILY 90 tablet 0   collagenase (SANTYL) ointment Apply 1 application topically daily. 15 g 0   Cyanocobalamin (B-12 COMPLIANCE INJECTION IJ) Inject 1 Dose as directed every 30 (thirty) days.     diazepam (VALIUM) 2 MG tablet Take 1 tablet (2 mg total) by mouth every 8 (eight) hours as needed for anxiety. 90 tablet 3   DULoxetine (CYMBALTA) 60 MG capsule TAKE ONE CAPSULE BY MOUTH ONE TIME DAILY 90 capsule 0   memantine (NAMENDA) 10 MG tablet TAKE ONE TABLET BY MOUTH TWICE DAILY 180 tablet 0   solifenacin (VESICARE) 10 MG tablet TAKE ONE TABLET BY MOUTH ONE TIME DAILY 90 tablet 1   white petrolatum (VASELINE) GEL Apply 1 application. topically as needed for lip care.     No current facility-administered medications on file prior to visit.    Allergies:   Allergies  Allergen Reactions   Codeine Itching and Nausea And Vomiting   Tape Rash    Tears the skin      OBJECTIVE:  Physical Exam  There were no vitals filed for this visit.  There is no height or weight on file to calculate BMI. No results found.   General: well developed, well nourished, pleasant elderly Caucasian female, seated, in no evident distress Head: head normocephalic and atraumatic.   Neck: supple with no carotid or supraclavicular bruits Cardiovascular: regular rate and rhythm, no murmurs Musculoskeletal: no deformity Skin:  no rash/petichiae Vascular:  Normal pulses all extremities   Neurologic Exam Mental Status: Awake and fully alert.  Fluent speech and language. Recent memory impaired and remote  memory intact. Attention span, concentration and fund of knowledge impaired. Mood and affect appropriate and cooperative with history taking and exam.     10/25/2021    1:34 PM 04/23/2021    1:22 PM 10/17/2020   10:06 AM  MMSE - Mini Mental  State Exam  Orientation to time 3 4 3   Orientation to Place 5 4 5   Registration 3 3 3   Attention/ Calculation 0 0 2  Recall 2 2 1   Language- name 2 objects 2 2 2   Language- repeat 0 0 1  Language- follow 3 step command 3 3 3   Language- read & follow direction 1 1 1   Write a sentence 1 1 1   Copy design 0 0 1  Total score 20 20 23    Cranial Nerves: Pupils equal, briskly reactive to light. Extraocular movements full without nystagmus. Visual fields full to confrontation. Hearing intact. Facial sensation intact. Face, tongue, palate moves normally and symmetrically.  Motor: Normal bulk and tone. Normal strength in all tested extremity muscles  Sensory.: intact to touch , pinprick , position and vibratory sensation.  Coordination: Rapid alternating movements normal in all extremities except slightly decreased left hand. Finger-to-nose and heel-to-shin performed accurately bilaterally. Gait and Station: Arises from chair without difficulty. Stance is normal. Gait demonstrates normal stride length and minimal imbalance with use of rolling walker.  Tandem walking heel toe not attempted Reflexes: 1+ and symmetric. Toes downgoing.       ASSESSMENT/PLAN: Jocelyn [name] is a 89 y.o. year old female presented with waxing/waning confusion, right-sided numbness and left-sided visual impairment on 01/31/2020 with stroke work-up revealing R PCA infarct in setting of R PCA high-grade stenosis, infarcts secondary to large vessel disease source. Vascular risk factors include HTN, HLD, diffuse intracranial stenosis, former tobacco use, advanced age and EtOH use.      Cognitive impairment: MMSE 20/30  (prior 20/30) Discussed importance of ensuring taking medication as  prescribed and use of compensation strategies and reminders placed around her home.  Discussed importance of memory exercises and routine physical activity as tolerated as well as adequate sleep and managing stroke risk factors. Discussed being involved in more activities that she enjoys. Advised to use RW at all times and importance of life alert button.  Continue Namenda 10 mg twice daily - unable to tell full benefit due to intermittent use EEG 06/26/2020 normal MR brain w/wo 06/23/2020 no new or acute abnormalities Dementia panel WNL (mild low B12 -receiving supplementation thru PCP) R PCA stroke:  Residual deficits: Cognitive impairment and imbalance.  Continue clopidogrel 75 mg daily and atorvastatin 40 mg daily for secondary stroke prevention.  Discussed secondary stroke prevention measures and importance of close PCP follow-up for aggressive stroke risk factor management including HTN with BP goal<130/90 and HLD with LDL goal<70     Follow up in 1 year or call earlier if needed   CC:  Janith Lima, MD    I spent 34 minutes of face-to-face and non-face-to-face time with patient and daughter.  This included previsit chart review, lab review, study review, electronic health record documentation, patient and daughter education regarding cognitive impairment, review and discussion of MMSE, history of stroke, importance of managing stroke risk factors, other topics as mentioned above and answered all other questions to patient and daughters satisfaction   Frann Rider, [facility]  [facility] [address] [place], [place] [address]  Phone [phone] Fax [phone] Note: This document was prepared with digital dictation and possible smart phrase technology. Any transcriptional errors that result from this process are unintentional.

## 2022-11-07 ENCOUNTER — Encounter: Payer: Self-pay | Admitting: Adult Health

## 2022-11-07 ENCOUNTER — Ambulatory Visit (INDEPENDENT_AMBULATORY_CARE_PROVIDER_SITE_OTHER): Payer: Medicare Other | Admitting: Adult Health

## 2022-11-07 VITALS — BP 156/87 | HR 78 | Ht 66.0 in | Wt 142.8 lb

## 2022-11-07 DIAGNOSIS — I63531 Cerebral infarction due to unspecified occlusion or stenosis of right posterior cerebral artery: Secondary | ICD-10-CM | POA: Diagnosis not present

## 2022-11-07 DIAGNOSIS — F01B0 Vascular dementia, moderate, without behavioral disturbance, psychotic disturbance, mood disturbance, and anxiety: Secondary | ICD-10-CM

## 2022-11-07 NOTE — Patient Instructions (Addendum)
Continue Namenda 5mg  twice daily for memory   Continue clopidogrel 75 mg daily for secondary stroke prevention  Please consider restarting atorvastatin for secondary stroke prevention measures unless stopped for specific reason  Continue to follow up with PCP regarding cholesterol and blood pressure management  Maintain strict control of hypertension with blood pressure goal below 130/90 and cholesterol with LDL cholesterol (bad cholesterol) goal below 70 mg/dL.   Signs of a Stroke? Follow the BEFAST method:  Balance Watch for a sudden loss of balance, trouble with coordination or vertigo Eyes Is there a sudden loss of vision in one or both eyes? Or double vision?  Face: Ask the person to smile. Does one side of the face droop or is it numb?  Arms: Ask the person to raise both arms. Does one arm drift downward? Is there weakness or numbness of a leg? Speech: Ask the person to repeat a simple phrase. Does the speech sound slurred/strange? Is the person confused ? Time: If you observe any of these signs, call 911.     No further recommendations from neurological standpoint at this time, can follow up as needed      Thank you for coming to see [place] at [facility]. I hope we have been able to provide you high quality care today.  You may receive a patient satisfaction survey over the next few weeks. We would appreciate your feedback and comments so that we may continue to improve ourselves and the health of our patients.

## 2023-05-26 ENCOUNTER — Other Ambulatory Visit: Payer: Self-pay

## 2023-05-26 ENCOUNTER — Emergency Department (HOSPITAL_BASED_OUTPATIENT_CLINIC_OR_DEPARTMENT_OTHER): Payer: Medicare Other

## 2023-05-26 ENCOUNTER — Encounter (HOSPITAL_BASED_OUTPATIENT_CLINIC_OR_DEPARTMENT_OTHER): Payer: Self-pay | Admitting: Emergency Medicine

## 2023-05-26 ENCOUNTER — Emergency Department (HOSPITAL_BASED_OUTPATIENT_CLINIC_OR_DEPARTMENT_OTHER)
Admission: EM | Admit: 2023-05-26 | Discharge: 2023-05-26 | Disposition: A | Discharge status: Home / Self Care | Payer: Medicare Other | Attending: Emergency Medicine | Admitting: Emergency Medicine

## 2023-05-26 DIAGNOSIS — R531 Weakness: Secondary | ICD-10-CM | POA: Insufficient documentation

## 2023-05-26 DIAGNOSIS — N390 Urinary tract infection, site not specified: Secondary | ICD-10-CM | POA: Diagnosis not present

## 2023-05-26 DIAGNOSIS — Z7901 Long term (current) use of anticoagulants: Secondary | ICD-10-CM | POA: Insufficient documentation

## 2023-05-26 DIAGNOSIS — S93401A Sprain of unspecified ligament of right ankle, initial encounter: Secondary | ICD-10-CM | POA: Diagnosis not present

## 2023-05-26 DIAGNOSIS — Z20822 Contact with and (suspected) exposure to covid-19: Secondary | ICD-10-CM | POA: Insufficient documentation

## 2023-05-26 DIAGNOSIS — F0390 Unspecified dementia without behavioral disturbance: Secondary | ICD-10-CM | POA: Diagnosis not present

## 2023-05-26 DIAGNOSIS — S99911A Unspecified injury of right ankle, initial encounter: Secondary | ICD-10-CM | POA: Diagnosis present

## 2023-05-26 DIAGNOSIS — W1839XA Other fall on same level, initial encounter: Secondary | ICD-10-CM | POA: Insufficient documentation

## 2023-05-26 LAB — RESP PANEL BY RT-PCR (RSV, FLU A&B, COVID)  RVPGX2
Influenza A by PCR: NEGATIVE
Influenza B by PCR: NEGATIVE
Resp Syncytial Virus by PCR: NEGATIVE
SARS Coronavirus 2 by RT PCR: NEGATIVE

## 2023-05-26 LAB — COMPREHENSIVE METABOLIC PANEL
ALT: 9 U/L (ref 0–44)
AST: 13 U/L — ABNORMAL LOW (ref 15–41)
Albumin: 3.6 g/dL (ref 3.5–5.0)
Alkaline Phosphatase: 83 U/L (ref 38–126)
Anion gap: 15 (ref 5–15)
BUN: 23 mg/dL (ref 8–23)
CO2: 23 mmol/L (ref 22–32)
Calcium: 10.3 mg/dL (ref 8.9–10.3)
Chloride: 99 mmol/L (ref 98–111)
Creatinine, Ser: 1.28 mg/dL — ABNORMAL HIGH (ref 0.44–1.00)
GFR, Estimated: 40 mL/min — ABNORMAL LOW (ref >60)
Glucose, Bld: 102 mg/dL — ABNORMAL HIGH (ref 70–99)
Potassium: 4.4 mmol/L (ref 3.5–5.1)
Sodium: 137 mmol/L (ref 135–145)
Total Bilirubin: 1.2 mg/dL (ref 0.3–1.2)
Total Protein: 6.5 g/dL (ref 6.5–8.1)

## 2023-05-26 LAB — URINALYSIS, W/ REFLEX TO CULTURE (INFECTION SUSPECTED)
Bilirubin Urine: NEGATIVE
Glucose, UA: NEGATIVE mg/dL
Hgb urine dipstick: NEGATIVE
Ketones, ur: NEGATIVE mg/dL
Nitrite: POSITIVE — AB
Protein, ur: NEGATIVE mg/dL
Specific Gravity, Urine: 1.02 (ref 1.005–1.030)
pH: 5.5 (ref 5.0–8.0)

## 2023-05-26 LAB — CBC WITH DIFFERENTIAL/PLATELET
Abs Immature Granulocytes: 0.06 10*3/uL (ref 0.00–0.07)
Basophils Absolute: 0.1 10*3/uL (ref 0.0–0.1)
Basophils Relative: 1 %
Eosinophils Absolute: 0.4 10*3/uL (ref 0.0–0.5)
Eosinophils Relative: 2 %
HCT: 40.6 % (ref 36.0–46.0)
Hemoglobin: 13.6 g/dL (ref 12.0–15.0)
Immature Granulocytes: 0 %
Lymphocytes Relative: 10 %
Lymphs Abs: 1.6 10*3/uL (ref 0.7–4.0)
MCH: 29.9 pg (ref 26.0–34.0)
MCHC: 33.5 g/dL (ref 30.0–36.0)
MCV: 89.2 fL (ref 80.0–100.0)
Monocytes Absolute: 2 10*3/uL — ABNORMAL HIGH (ref 0.1–1.0)
Monocytes Relative: 13 %
Neutro Abs: 11.5 10*3/uL — ABNORMAL HIGH (ref 1.7–7.7)
Neutrophils Relative %: 74 %
Platelets: 324 10*3/uL (ref 150–400)
RBC: 4.55 MIL/uL (ref 3.87–5.11)
RDW: 12.7 % (ref 11.5–15.5)
WBC: 15.7 10*3/uL — ABNORMAL HIGH (ref 4.0–10.5)
nRBC: 0 % (ref 0.0–0.2)

## 2023-05-26 MED ORDER — CEPHALEXIN 500 MG PO CAPS: 500.0000 mg | ORAL_CAPSULE | Freq: Three times a day (TID) | ORAL | 0 refills | Status: AC

## 2023-05-26 MED ORDER — SODIUM CHLORIDE 0.9 % IV SOLN
2.0000 g | Freq: Once | INTRAVENOUS | Status: AC
  Administered 2023-05-26: 2 g via INTRAVENOUS
  Filled 2023-05-26: qty 20

## 2023-05-26 NOTE — ED Triage Notes (Signed)
Brought by family , pt from assisted living , memory unit . Had URI last week , per grand daughter , facility called stating pt been progressively weak x 3 days .  Normally pt is ambulatory , but she was unable to ambulate today . Right side weakness .  Pt reports dysuria , pointing to her groin area . Negative resp panel at the facility a week ago  per daughter .

## 2023-05-26 NOTE — Care Management (Signed)
Transition of Care [facility]) - Emergency Department Mini Assessment   Patient Details  Name: Jocelyn [name] MRN: [id number] Date of Birth: Jan 07, 1932  [facility] of [facility]) CM/SW Contact:    Lavenia Atlas, RN Phone Number: 05/26/2023, 3:06 PM   Clinical Narrative: Received secure chat from EDP requesting assistance with DME: wheelchair. This RNCM spoke with patient's daughter Babette Relic who reports request DME;walker prior to discharge today from the [facility] DME. Offered choice for DME: wheelchair, patient's daughter report will accept any DME supplier that can provide today prior to discharge.  This RNCM spoke with [facility] in [place] who reports patient is currently being followed [facility] at facility and does not need any new PT orders. Chip Boer can accept DME: wheelchair order however unsure how soon wc can be received. Patient's daughter Tammy request [employer] to follow for DME.  Notified EDP that DME: wheelchair order needs to include wc accessories: leg rest, and seat cushion. Narrative note on file, awaiting EDP to sign.      TOC will continue to follow.   ED Mini Assessment:       Barrier interventions: coordinating DME: wheelchair  Means of departure: Car  Interventions which prevented an admission or readmission: Other (must enter comment) (DME: wheelchair requested prior to discharge)    Patient Contact and Communications     Spoke with: Piedad Climes [phone] Contact Date: 05/26/23,   Contact time: 1506 Contact Phone Number: [phone]        Choice offered to / list presented to : Adult Children  Admission diagnosis:  COUGH,  CHEST CONGESTION,  CONFUSION,  WEAK Patient Active Problem List   Diagnosis Date Noted   Deficiency anemia 03/11/2022   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) 03/11/2022   Need for prophylactic vaccination and inoculation against varicella 03/11/2022   GAD (generalized anxiety disorder)  03/11/2022   Skin tear of right lower leg without complication 06/22/2020   OAB (overactive bladder) 02/23/2020   Cerebrovascular accident (CVA) due to stenosis of right posterior cerebral artery (HCC) 02/08/2020   Stage 3b chronic kidney disease (HCC) 01/26/2020   Current severe episode of major depressive disorder without psychotic features without prior episode (HCC) 01/25/2020   Vitamin B12 deficiency neuropathy (HCC) 01/25/2020   Primary osteoarthritis involving multiple joints 12/08/2017   Thoracic aorta atherosclerosis (HCC) 12/12/2014   Dyslipidemia, goal LDL below 130 09/20/2010   Essential hypertension 12/22/2008   PCP:  Joycelyn Man, NP Pharmacy:   [facility] # [address], [place] - [address] [place] [address] Phone: [phone] Fax: [phone]  [id number] Ginette Otto [facility] - [address] [place] [address] Phone: [phone] Fax: [phone]  [facility] [place], [place] - [place] [address] [place] [address] Phone: [phone] Fax: [phone]

## 2023-05-26 NOTE — ED Provider Notes (Addendum)
Received patient in turnover from Dr. Lockie Mola.  Please see their note for further details of Hx, PE.  Briefly patient is a 89 y.o. female with a Weakness .  Patient with a likely recent fall.  Awaiting imaging.  Plain films without obvious fracture.  CT imaging without acute finding.  D/c home.       Melene Plan, DO 05/26/23 1605

## 2023-05-26 NOTE — ED Notes (Signed)
In and out cath performed by this Scientist, physiological)

## 2023-05-26 NOTE — Discharge Instructions (Addendum)
Can be weightbearing as tolerated with walking boot.  Recommend using wheelchair or walker the patient's comfort.  Follow-up with primary care doctor.  You can also follow-up with orthopedic Dr. Eulah Pont if needed as well.  I have sent an antibiotic to continue treatment for urinary tract infection.

## 2023-05-26 NOTE — Progress Notes (Deleted)
Durable Medical Equipment  (From admission, onward)           Start     Ordered   05/26/23 1359  For home use only DME wheelchair cushion (seat and back)  Once        05/26/23 1358            Patient: Jocelyn [name] Current DOB: Nov 06, 1931 The patient is unable to use rolling walker, cane, or crutch to complete activities of daily living in her home. Sayge Eargle does have a caregiver and is able to self propel a manual wheelchair.

## 2023-05-26 NOTE — ED Notes (Signed)
Fall risk armband Fall risk socks Fall risk sign on door

## 2023-05-26 NOTE — ED Provider Notes (Signed)
[place] [facility] AT [facility] Provider Note   CSN: [id number] Arrival date & time: 05/26/23  1110     History  Chief Complaint  Patient presents with   Weakness    Jocelyn [name] is a 89 y.o. female.  Patient here with cough congestion URI symptoms for the last few days.  Has a history of dementia and stroke.  Lives at assisted living in a memory unit with 24/7 care.  Uses a walker and wheelchair at times to ambulate but normally she does not need assistive devices.  She is having pain in her right knee and right ankle today and having hard time ambulating.  She was not acting quite like herself overnight.  Seems to be having some disorganized thought process.  She had a negative viral panel here recently at her facility.  She can tell me her name.  She is telling me she has pain in her ankle and right knee.  She cannot remember she had a fall.  She does take Plavix.  No sign of head trauma per family.  She has been acting towards her baseline this morning.  But they were concerned for possible pneumonia or urinary tract infection.  They were concerned about the swelling in her ankle.  No other facility could think that she had fallen.  But she was in bed mostly yesterday.  She has been having people help with transfers at times.  The history is provided by the patient and a caregiver.       Home Medications Prior to Admission medications   Medication Sig Start Date End Date Taking? Authorizing Provider  cephALEXin (KEFLEX) 500 MG capsule Take 1 capsule (500 mg total) by mouth 3 (three) times daily for 5 days. 05/26/23 05/31/23 Yes Floy Riegler, DO  atorvastatin (LIPITOR) 40 MG tablet TAKE ONE TABLET BY MOUTH ONE TIME DAILY 10/17/21   Etta Grandchild, MD  clopidogrel (PLAVIX) 75 MG tablet TAKE ONE TABLET BY MOUTH ONE TIME DAILY 10/17/21   Etta Grandchild, MD  collagenase (SANTYL) ointment Apply 1 application topically daily. 07/26/20   Young, Johanna C, PA-C   Cyanocobalamin (B-12 COMPLIANCE INJECTION IJ) Inject 1 Dose as directed every 30 (thirty) days.    [provider]  diazepam (VALIUM) 2 MG tablet Take 1 tablet (2 mg total) by mouth every 8 (eight) hours as needed for anxiety. 03/11/22   Etta Grandchild, MD  DULoxetine (CYMBALTA) 60 MG capsule TAKE ONE CAPSULE BY MOUTH ONE TIME DAILY 10/17/21   Etta Grandchild, MD  memantine (NAMENDA) 10 MG tablet TAKE ONE TABLET BY MOUTH TWICE DAILY 10/17/21   Ihor Austin, NP  nitrofurantoin, macrocrystal-monohydrate, (MACROBID) 100 MG capsule Take 100 mg by mouth 2 (two) times daily. 09/21/22   [provider]  solifenacin (VESICARE) 10 MG tablet TAKE ONE TABLET BY MOUTH ONE TIME DAILY 10/20/21   Etta Grandchild, MD  white petrolatum (VASELINE) GEL Apply 1 application. topically as needed for lip care.    [provider]      Allergies    Codeine and Tape    Review of Systems   Review of Systems  Physical Exam Updated Vital Signs BP (!) 148/82   Pulse 82   Temp 97.6 F (36.4 C)   Resp 19   Wt 65.3 kg   SpO2 100%   BMI 23.24 kg/m  Physical Exam Vitals and nursing note reviewed.  Constitutional:      General: She is  not in acute distress.    Appearance: She is well-developed.  HENT:     Head: Normocephalic and atraumatic.  Eyes:     Extraocular Movements: Extraocular movements intact.     Conjunctiva/sclera: Conjunctivae normal.     Pupils: Pupils are equal, round, and reactive to light.  Cardiovascular:     Rate and Rhythm: Normal rate and regular rhythm.     Heart sounds: No murmur heard. Pulmonary:     Effort: Pulmonary effort is normal. No respiratory distress.     Breath sounds: Normal breath sounds.  Abdominal:     General: Abdomen is flat.     Palpations: Abdomen is soft.     Tenderness: There is no abdominal tenderness.  Musculoskeletal:        General: Swelling and tenderness present.     Cervical back: Neck supple.     Comments: Tenderness and  swelling to the right ankle with some bruising over the shin, tenderness to the right knee but she has good range of motion of the right knee and the right hip without much discomfort, no obvious deformity to the right ankle but fairly significant swelling to the right lateral malleolus  Skin:    General: Skin is warm and dry.     Capillary Refill: Capillary refill takes less than 2 seconds.  Neurological:     General: No focal deficit present.     Mental Status: She is alert.     Comments: Patient overall appears to have normal cranial nerves, normal speech, can tell me her name and who is in the room with her and where she is she is alert and oriented.  Strength and sensation appear to be intact.  Visual fields intact.  Normal finger-to-nose finger, normal speech, 5+ out of 5 strength throughout, normal sensation throughout  Psychiatric:        Mood and Affect: Mood normal.     ED Results / Procedures / Treatments   Labs (all labs ordered are listed, but only abnormal results are displayed) Labs Reviewed  COMPREHENSIVE METABOLIC PANEL - Abnormal; Notable for the following components:      Result Value   Glucose, Bld 102 (*)    Creatinine, Ser 1.28 (*)    AST 13 (*)    GFR, Estimated 40 (*)    All other components within normal limits  CBC WITH DIFFERENTIAL/PLATELET - Abnormal; Notable for the following components:   WBC 15.7 (*)    Neutro Abs 11.5 (*)    Monocytes Absolute 2.0 (*)    All other components within normal limits  URINALYSIS, W/ REFLEX TO CULTURE (INFECTION SUSPECTED) - Abnormal; Notable for the following components:   APPearance CLOUDY (*)    Nitrite POSITIVE (*)    Leukocytes,Ua SMALL (*)    Bacteria, UA MANY (*)    All other components within normal limits  RESP PANEL BY RT-PCR (RSV, FLU A&B, COVID)  [id number]  URINE CULTURE    EKG EKG Interpretation Date/Time:  Monday May 26 2023 11:45:46 EDT Ventricular Rate:  96 PR Interval:  171 QRS Duration:  82 QT  Interval:  353 QTC Calculation: 447 R Axis:   -68  Text Interpretation: Sinus rhythm Confirmed by Virgina Norfolk [phone]) on 05/26/2023 12:07:41 PM  Radiology No results found.  Procedures Procedures    Medications Ordered in ED Medications  cefTRIAXone (ROCEPHIN) 2 g in sodium chloride 0.9 % 100 mL IVPB (2 g Intravenous New Bag/Given 05/26/23 1414)    ED  Course/ Medical Decision Making/ A&P                                 Medical Decision Making Amount and/or Complexity of Data Reviewed Labs: ordered. Radiology: ordered.   Cailee Blanke is here with generalized weakness.  Normal vitals.  No fever.  Has had cough and sputum production the last few days.  She lives in an assisted living facility in the memory unit.  She is here with 24/7 care.  She does use assistive devices sometimes to ambulate but she was having a hard time ambulating today but appears that she has swelling to the right ankle.  The not sure if there has been any trauma but she is having pain in her right knee and right ankle and it appears that she likely has either an ankle sprain or fracture on exam.  She got tenderness to the right knee but no obvious swelling or deformity.  No major pain to the right hip.  There is no signs of head trauma on exam.  She is neurologically intact.  I have much lower suspicion that she suffered a stroke given that her strength sensation and speech and orientation is intact.  Sounds like she has been having some hallucinations at night.  Overall I will pursue trauma workup with a CT scan of her head and neck and x-rays of her hip, right knee, right ankle.  Will get a chest x-ray to evaluate for pneumonia, urinalysis to evaluate for infection and check basic labs to look for any electrolyte abnormalities.  She does not look dehydrated.  Will recheck a COVID and flu test.  Overall low suspicion for stroke and family also is okay now pursuing a workup passed a CT scan for stroke given dementia  at baseline and my low clinical suspicion.  She does have support at home given what I suspect is a least a right ankle sprain.  She has a walker and wheelchair and people that can help her with her ADLs.  Given her vital signs have no concern for sepsis at this time.  Urinalysis does look consistent with infection.  Will treat with a dose IV Rocephin.  She does have white count of 15 but otherwise lab works unremarkable.  I do not think she has sepsis at this time.  Her mentation as well.  Images including CT scan of the head and neck and x-rays are pending at time of handoff to oncoming ED staff.  I have ordered a wheelchair for her for back of her facility assuming that she is has ankle sprain.  I have talked with case management about making sure that she has access to PT at her facility.  Anticipate but not a walking boot.  I have sent an antibiotic to your pharmacy.  Please see oncoming ED staff's note for further results, evaluation, disposition of the patient.  This chart was dictated using voice recognition software.  Despite best efforts to proofread,  errors can occur which can change the documentation meaning.         Final Clinical Impression(s) / ED Diagnoses Final diagnoses:  Lower urinary tract infectious disease  Sprain of right ankle, unspecified ligament, initial encounter    Rx / DC Orders ED Discharge Orders          Ordered    cephALEXin (KEFLEX) 500 MG capsule  3 times daily  05/26/23 1416              Virgina Norfolk, DO 05/26/23 1417

## 2023-05-26 NOTE — ED Provider Notes (Signed)
Given right ankle injury and right knee injury I think the safest mode of ambulation in the short-term might be wheelchair and I have ordered this.   Virgina Norfolk, DO 05/26/23 8469

## 2023-05-26 NOTE — Progress Notes (Signed)
Durable Medical Equipment  (From admission, onward)           Start     Ordered   05/26/23 1518  For home use only DME wheelchair cushion (seat and back)  Once       Comments: Will need wheelchair to include accessories: leg rest and seat cushion   05/26/23 1522               Patient: Jocelyn [name] DOB: 1932/07/01 The patient is unable to use rolling walker, cane, or crutch to complete activities of daily living in her home. Heidee Anania does have a caregiver and is able to self propel a manual wheelchair.

## 2023-05-28 LAB — URINE CULTURE: Culture: >=100000 — AB

## 2023-05-29 ENCOUNTER — Telehealth (HOSPITAL_BASED_OUTPATIENT_CLINIC_OR_DEPARTMENT_OTHER): Payer: Self-pay

## 2023-05-29 NOTE — Telephone Encounter (Signed)
Post ED Visit - Positive Culture Follow-up  Culture report reviewed by antimicrobial stewardship pharmacist: [facility] []  Enzo Bi, Pharm.D. []  Celedonio Miyamoto, Pharm.D., BCPS AQ-ID []  Garvin Fila, Pharm.D., BCPS [x]  Wilburn Cornelia, [address].D., BCPS []  [place], [address].D., BCPS, AAHIVP []  Estella Husk, Pharm.D., BCPS, AAHIVP []  Lysle Pearl, PharmD, BCPS []  Phillips Climes, PharmD, BCPS []  Agapito Games, PharmD, BCPS []  Verlan Friends, PharmD []  Mervyn Gay, PharmD, BCPS []  Vinnie Level, PharmD  [facility] []  Len Childs, PharmD []  Greer Pickerel, PharmD []  Adalberto Cole, PharmD []  Perlie Gold, Rph []  Lonell Face) Jean Rosenthal, PharmD []  Earl Many, PharmD []  Junita Push, PharmD []  Dorna Leitz, PharmD []  Terrilee Files, PharmD []  Lynann Beaver, PharmD []  Keturah Barre, PharmD []  Loralee Pacas, PharmD []  Bernadene Person, PharmD   Positive urine culture Treated with Cephalexin, organism sensitive to the same and no further patient follow-up is required at this time.  Sandria Senter 05/29/2023, 12:13 PM

## 2023-06-10 NOTE — Progress Notes (Signed)
ok 

## 2023-08-14 ENCOUNTER — Emergency Department (HOSPITAL_COMMUNITY): Payer: Medicare Other

## 2023-08-14 ENCOUNTER — Observation Stay (HOSPITAL_COMMUNITY)
Admission: EM | Admit: 2023-08-14 | Discharge: 2023-08-17 | Disposition: A | Discharge status: Home / Self Care | Payer: Medicare Other | Attending: Internal Medicine | Admitting: Internal Medicine

## 2023-08-14 ENCOUNTER — Encounter (HOSPITAL_COMMUNITY): Payer: Self-pay | Admitting: Internal Medicine

## 2023-08-14 ENCOUNTER — Other Ambulatory Visit: Payer: Self-pay

## 2023-08-14 DIAGNOSIS — R4701 Aphasia: Secondary | ICD-10-CM

## 2023-08-14 DIAGNOSIS — N1832 Chronic kidney disease, stage 3b: Secondary | ICD-10-CM | POA: Diagnosis not present

## 2023-08-14 DIAGNOSIS — Z87891 Personal history of nicotine dependence: Secondary | ICD-10-CM | POA: Insufficient documentation

## 2023-08-14 DIAGNOSIS — F0390 Unspecified dementia without behavioral disturbance: Secondary | ICD-10-CM | POA: Diagnosis not present

## 2023-08-14 DIAGNOSIS — S42142A Displaced fracture of glenoid cavity of scapula, left shoulder, initial encounter for closed fracture: Secondary | ICD-10-CM | POA: Diagnosis not present

## 2023-08-14 DIAGNOSIS — R220 Localized swelling, mass and lump, head: Secondary | ICD-10-CM | POA: Diagnosis not present

## 2023-08-14 DIAGNOSIS — Z79899 Other long term (current) drug therapy: Secondary | ICD-10-CM | POA: Diagnosis not present

## 2023-08-14 DIAGNOSIS — F419 Anxiety disorder, unspecified: Secondary | ICD-10-CM

## 2023-08-14 DIAGNOSIS — W19XXXA Unspecified fall, initial encounter: Secondary | ICD-10-CM | POA: Diagnosis present

## 2023-08-14 DIAGNOSIS — F32A Depression, unspecified: Secondary | ICD-10-CM | POA: Diagnosis present

## 2023-08-14 DIAGNOSIS — F0394 Unspecified dementia, unspecified severity, with anxiety: Secondary | ICD-10-CM

## 2023-08-14 DIAGNOSIS — N390 Urinary tract infection, site not specified: Secondary | ICD-10-CM | POA: Diagnosis not present

## 2023-08-14 DIAGNOSIS — I129 Hypertensive chronic kidney disease with stage 1 through stage 4 chronic kidney disease, or unspecified chronic kidney disease: Secondary | ICD-10-CM | POA: Diagnosis not present

## 2023-08-14 DIAGNOSIS — I712 Thoracic aortic aneurysm, without rupture, unspecified: Secondary | ICD-10-CM | POA: Diagnosis not present

## 2023-08-14 DIAGNOSIS — M7989 Other specified soft tissue disorders: Secondary | ICD-10-CM | POA: Diagnosis not present

## 2023-08-14 DIAGNOSIS — R296 Repeated falls: Principal | ICD-10-CM | POA: Insufficient documentation

## 2023-08-14 DIAGNOSIS — Z8673 Personal history of transient ischemic attack (TIA), and cerebral infarction without residual deficits: Secondary | ICD-10-CM | POA: Diagnosis not present

## 2023-08-14 DIAGNOSIS — N1831 Chronic kidney disease, stage 3a: Secondary | ICD-10-CM | POA: Diagnosis present

## 2023-08-14 DIAGNOSIS — M25512 Pain in left shoulder: Secondary | ICD-10-CM | POA: Diagnosis present

## 2023-08-14 DIAGNOSIS — S022XXA Fracture of nasal bones, initial encounter for closed fracture: Secondary | ICD-10-CM | POA: Diagnosis present

## 2023-08-14 DIAGNOSIS — N3281 Overactive bladder: Secondary | ICD-10-CM | POA: Diagnosis present

## 2023-08-14 LAB — URINALYSIS, W/ REFLEX TO CULTURE (INFECTION SUSPECTED)
Bilirubin Urine: NEGATIVE
Glucose, UA: NEGATIVE mg/dL
Hgb urine dipstick: NEGATIVE
Ketones, ur: NEGATIVE mg/dL
Nitrite: NEGATIVE
Protein, ur: NEGATIVE mg/dL
Specific Gravity, Urine: 1.015 (ref 1.005–1.030)
pH: 7 (ref 5.0–8.0)

## 2023-08-14 LAB — CBC
HCT: 43.3 % (ref 36.0–46.0)
Hemoglobin: 14.6 g/dL (ref 12.0–15.0)
MCH: 29.9 pg (ref 26.0–34.0)
MCHC: 33.7 g/dL (ref 30.0–36.0)
MCV: 88.5 fL (ref 80.0–100.0)
Platelets: 257 10*3/uL (ref 150–400)
RBC: 4.89 MIL/uL (ref 3.87–5.11)
RDW: 13 % (ref 11.5–15.5)
WBC: 8 10*3/uL (ref 4.0–10.5)
nRBC: 0 % (ref 0.0–0.2)

## 2023-08-14 LAB — COMPREHENSIVE METABOLIC PANEL
ALT: 8 U/L (ref 0–44)
AST: 15 U/L (ref 15–41)
Albumin: 3.8 g/dL (ref 3.5–5.0)
Alkaline Phosphatase: 66 U/L (ref 38–126)
Anion gap: 11 (ref 5–15)
BUN: 16 mg/dL (ref 8–23)
CO2: 23 mmol/L (ref 22–32)
Calcium: 10.3 mg/dL (ref 8.9–10.3)
Chloride: 102 mmol/L (ref 98–111)
Creatinine, Ser: 1.22 mg/dL — ABNORMAL HIGH (ref 0.44–1.00)
GFR, Estimated: 42 mL/min — ABNORMAL LOW (ref >60)
Glucose, Bld: 109 mg/dL — ABNORMAL HIGH (ref 70–99)
Potassium: 4.3 mmol/L (ref 3.5–5.1)
Sodium: 136 mmol/L (ref 135–145)
Total Bilirubin: 1 mg/dL (ref 0.0–1.2)
Total Protein: 6.2 g/dL — ABNORMAL LOW (ref 6.5–8.1)

## 2023-08-14 LAB — PROTIME-INR
INR: 1 (ref 0.8–1.2)
Prothrombin Time: 13.7 s (ref 11.4–15.2)

## 2023-08-14 LAB — CBG MONITORING, ED: Glucose-Capillary: 94 mg/dL (ref 70–99)

## 2023-08-14 MED ORDER — SENNOSIDES-DOCUSATE SODIUM 8.6-50 MG PO TABS
1.0000 | ORAL_TABLET | Freq: Every day | ORAL | Status: DC
  Administered 2023-08-14 – 2023-08-15 (×2): 1 via ORAL
  Filled 2023-08-14 (×3): qty 1

## 2023-08-14 MED ORDER — ACETAMINOPHEN 500 MG PO TABS: 1000.0000 mg | ORAL_TABLET | Freq: Four times a day (QID) | ORAL | Status: DC | PRN

## 2023-08-14 MED ORDER — MORPHINE SULFATE (PF) 4 MG/ML IV SOLN
4.0000 mg | Freq: Once | INTRAVENOUS | Status: AC
  Administered 2023-08-14: 4 mg via INTRAVENOUS
  Filled 2023-08-14: qty 1

## 2023-08-14 MED ORDER — DULOXETINE HCL 20 MG PO CPEP
40.0000 mg | ORAL_CAPSULE | Freq: Every day | ORAL | Status: DC
  Administered 2023-08-14 – 2023-08-17 (×4): 40 mg via ORAL
  Filled 2023-08-14 (×4): qty 2

## 2023-08-14 MED ORDER — GUAIFENESIN ER 600 MG PO TB12
600.0000 mg | ORAL_TABLET | Freq: Two times a day (BID) | ORAL | Status: DC
  Administered 2023-08-14 – 2023-08-17 (×7): 600 mg via ORAL
  Filled 2023-08-14 (×7): qty 1

## 2023-08-14 MED ORDER — BISACODYL 10 MG RE SUPP: 10.0000 mg | RECTAL | Status: DC | PRN

## 2023-08-14 MED ORDER — IOHEXOL 350 MG/ML SOLN
75.0000 mL | Freq: Once | INTRAVENOUS | Status: AC | PRN
  Administered 2023-08-14: 75 mL via INTRAVENOUS

## 2023-08-14 MED ORDER — MEMANTINE HCL 5 MG PO TABS
5.0000 mg | ORAL_TABLET | Freq: Two times a day (BID) | ORAL | Status: DC
  Administered 2023-08-14 – 2023-08-17 (×7): 5 mg via ORAL
  Filled 2023-08-14 (×8): qty 1

## 2023-08-14 MED ORDER — MAGNESIUM HYDROXIDE 400 MG/5ML PO SUSP: 30.0000 mL | Freq: Every day | ORAL | Status: DC | PRN

## 2023-08-14 MED ORDER — NITROFURANTOIN MONOHYD MACRO 100 MG PO CAPS
100.0000 mg | ORAL_CAPSULE | Freq: Two times a day (BID) | ORAL | Status: AC
  Administered 2023-08-14 – 2023-08-15 (×3): 100 mg via ORAL
  Filled 2023-08-14 (×4): qty 1

## 2023-08-14 MED ORDER — FESOTERODINE FUMARATE ER 4 MG PO TB24
4.0000 mg | ORAL_TABLET | Freq: Every day | ORAL | Status: DC
  Administered 2023-08-14 – 2023-08-17 (×4): 4 mg via ORAL
  Filled 2023-08-14 (×5): qty 1

## 2023-08-14 MED ORDER — ACETAMINOPHEN 325 MG PO TABS: 650.0000 mg | ORAL_TABLET | Freq: Four times a day (QID) | ORAL | Status: DC | PRN

## 2023-08-14 MED ORDER — OXYCODONE HCL 5 MG PO TABS
5.0000 mg | ORAL_TABLET | Freq: Four times a day (QID) | ORAL | Status: DC | PRN
  Administered 2023-08-14 – 2023-08-16 (×2): 5 mg via ORAL
  Filled 2023-08-14 (×2): qty 1

## 2023-08-14 NOTE — Progress Notes (Signed)
 Orthopedic Tech Progress Note Patient Details:  [name] [birth year] [id number]  Responded to level 2 trauma, not needed at this time  Patient ID: Jocelyn [name], female   DOB: [birth year], 89 y.o.   MRN: [id number]  Bernarda JAYSON December 08/14/2023, 12:47 AM

## 2023-08-14 NOTE — ED Provider Notes (Signed)
 Patient was holding in the ED for evaluation of possible nursing home placement.  Patient initially was seen last evening after a fall.  Patient had a stroke evaluation.  Patient was noted to have pain in her shoulder as well as her face.  Patient was seen by Dr. Theadore and her imaging tests were diagnostic of a glenoid fracture and a nasal bone fracture.  Patient holding in the ED for possible nursing home placement.  PT has evaluated the patient and patient is not appropriate for discharge.  Patient does not qualify for transfer from ED to SNF due to her insurance.  Patient is not safe for discharge with her injuries.  Of note patient also has noted some swelling in her right arm since being in the ED.  Patient did have an IV placed.  Doppler study has been ordered to rule out DVT  I will consult the medical service for possible admission  Case discussed with Dr Jimmy Randol Simmonds, MD 08/14/23 [phone]

## 2023-08-14 NOTE — ED Notes (Signed)
 Trauma Response Nurse Documentation   Jocelyn [name] is a 89 y.o. female arriving to [facility] ED via EMS  On clopidogrel  75 mg daily. Trauma was activated as a Level 2 by ED charge RN based on the following trauma criteria Elderly patients > 65 with head trauma on anti-coagulation (excluding ASA).  Patient cleared for CT by Dr. Theadore EDP. Pt transported to CT with trauma response nurse present to monitor. RN remained with the patient throughout their absence from the department for clinical observation.   GCS 15.  History   Past Medical History:  Diagnosis Date   HTN (hypertension)    Hyperlipidemia    Urinary incontinence      Past Surgical History:  Procedure Laterality Date   ABDOMINAL HYSTERECTOMY     TONSILLECTOMY         Initial Focused Assessment (If applicable, or please see trauma documentation): Alert/oriented female presents from SNF after a fall with head injury, on plavix . Bruising to left knee, nose, left eye. Abrasion to nose.  Airway patent, BS clear No obvious uncontrolled hemorrhage GCS 15 CT's Completed:   CT Head, CT Maxillofacial, and CT C-Spine   Interventions:  Portable XRAYS, CT head, face, neck IV/labs deferred by EDP  Plan for disposition:  Discharge anticipated  Consults completed:  none  Event Summary: Presents after a fall at SNF, states she stepped on something sharp and fell. Nose abrasion with bleeding controlled, bruising to nose, left eye and left knee. Scans with nasal fx. Anticipate D/C back to facility. MTP Summary (If applicable): NA  Bedside handoff with ED RN Jocelyn [name].    Jocelyn [name]  Trauma Response RN  Please call TRN at [phone] for further assistance.

## 2023-08-14 NOTE — Progress Notes (Signed)
 Orthopedic Tech Progress Note Patient Details:  Jocelyn [name] Apr 27, 1932 [id number] Shoulder immobilizer applied to patients LUE   Ortho Devices Type of Ortho Device: Sling immobilizer Ortho Device/Splint Location: LUE Ortho Device/Splint Interventions: Ordered, Application, Adjustment   Post Interventions Patient Tolerated: Well Instructions Provided: Adjustment of device, Care of device  Jocelyn [name] December 08/14/2023, 6:15 AM

## 2023-08-14 NOTE — ED Provider Notes (Signed)
 [facility] Emergency Department Provider Note MRN:  [id number]  Arrival date & time: 08/14/23     Chief Complaint   Fall   History of Present Illness   Jocelyn [name] is a 89 y.o. year-old female with a history of hypertension presenting to the ED with chief complaint of fall.  Stepped on something sharp and tripped and fell on the hardwood floor.  Endorsing left shoulder pain, left knee pain, left foot pain, head trauma, nose pain.  Review of Systems  A thorough review of systems was obtained and all systems are negative except as noted in the HPI and PMH.   Patient's Health History    Past Medical History:  Diagnosis Date   HTN (hypertension)    Hyperlipidemia    Urinary incontinence     Past Surgical History:  Procedure Laterality Date   ABDOMINAL HYSTERECTOMY     TONSILLECTOMY      Family History  Problem Relation Age of Onset   Diabetes Other        1st degree relative   Hyperlipidemia Other    Stroke Other        1st degree Female relative <50   Cancer Neg Hx    Early death Neg Hx    Heart disease Neg Hx    Hypertension Neg Hx    Kidney disease Neg Hx     Social History   Socioeconomic History   Marital status: Widowed    Spouse name: Not on file   Number of children: 1   Years of education: Not on file   Highest education level: Not on file  Occupational History   Occupation: Retired  Tobacco Use   Smoking status: Former   Smokeless tobacco: Former    Quit date: 03/17/2017   Tobacco comments:    STATES SHE QUIT 25 YEARS AGO  Vaping Use   Vaping status: Never Used  Substance and Sexual Activity   Alcohol use: Yes    Alcohol/week: 14.0 standard drinks of alcohol    Types: 7 Glasses of wine, 7 Cans of beer per week   Drug use: No   Sexual activity: Never    Birth control/protection: Surgical  Other Topics Concern   Not on file  Social History Narrative   Regular Exercise-no         Social Drivers of Health    Financial Resource Strain: Low Risk  (06/14/2021)   Overall Financial Resource Strain (CARDIA)    Difficulty of Paying Living Expenses: Not hard at all  Food Insecurity: No Food Insecurity (06/20/2022)   Hunger Vital Sign    Worried About Running Out of Food in the Last Year: Never true    Ran Out of Food in the Last Year: Never true  Transportation Needs: No Transportation Needs (06/20/2022)   PRAPARE - Administrator, Civil Service (Medical): No    Lack of Transportation (Non-Medical): No  Physical Activity: Inactive (06/20/2022)   Exercise Vital Sign    Days of Exercise per Week: 0 days    Minutes of Exercise per Session: 0 min  Stress: No Stress Concern Present (06/20/2022)   [employer] of Occupational Health - Occupational Stress Questionnaire    Feeling of Stress : Not at all  Social Connections: Moderately Integrated (06/20/2022)   Social Connection and Isolation Panel [NHANES]    Frequency of Communication with Friends and Family: More than three times a week    Frequency of Social Gatherings with  Friends and Family: More than three times a week    Attends Religious Services: More than 4 times per year    Active Member of Clubs or Organizations: Yes    Attends Banker Meetings: More than 4 times per year    Marital Status: Widowed  Intimate Partner Violence: Not At Risk (06/20/2022)   Humiliation, Afraid, Rape, and Kick questionnaire    Fear of Current or Ex-Partner: No    Emotionally Abused: No    Physically Abused: No    Sexually Abused: No     Physical Exam   Vitals:   08/14/23 0419 08/14/23 0430  BP:  (!) 142/87  Pulse:  81  Resp:  18  Temp: 98.3 F (36.8 C)   SpO2:  99%    CONSTITUTIONAL: Chronically ill-appearing, NAD NEURO/PSYCH:  Alert and oriented x 3, no focal deficits EYES:  eyes equal and reactive ENT/NECK:  no LAD, no JVD CARDIO: Regular rate, well-perfused, normal S1 and S2 PULM:  CTAB no wheezing or rhonchi GI/GU:   non-distended, non-tender MSK/SPINE:  No gross deformities, tenderness to left foot, left knee SKIN: Bruising to the nose, left shoulder   *Additional and/or pertinent findings included in MDM below  Diagnostic and Interventional Summary    EKG Interpretation Date/Time:    Ventricular Rate:    PR Interval:    QRS Duration:    QT Interval:    QTC Calculation:   R Axis:      Text Interpretation:         Labs Reviewed  COMPREHENSIVE METABOLIC PANEL - Abnormal; Notable for the following components:      Result Value   Glucose, Bld 109 (*)    Creatinine, Ser 1.22 (*)    Total Protein 6.2 (*)    GFR, Estimated 42 (*)    All other components within normal limits  CBC  PROTIME-INR  CBG MONITORING, ED    MR BRAIN WO CONTRAST  Final Result    CT CHEST ABDOMEN PELVIS W CONTRAST  Final Result    CT ANGIO HEAD NECK W WO CM (CODE STROKE)  Final Result    DG Pelvis Portable  Final Result    DG Chest Portable 1 View  Final Result    DG Foot Complete Left  Final Result    DG Knee Complete 4 Views Left  Final Result    DG Shoulder Left  Final Result    CT MAXILLOFACIAL WO CONTRAST  Final Result    CT CERVICAL SPINE WO CONTRAST  Final Result    CT HEAD WO CONTRAST ( )  Final Result      Medications  morphine  (PF) 4 MG/ML injection 4 mg (4 mg Intravenous Given 08/14/23 0150)  iohexol  (OMNIPAQUE ) 350 MG/ML injection 75 mL (75 mLs Intravenous Contrast Given 08/14/23 0312)     Procedures  /  Critical Care .Critical Care  Performed by: Theadore Ozell HERO, MD Authorized by: Theadore Ozell HERO, MD   Critical care provider statement:    Critical care time (minutes):  32   Critical care was necessary to treat or prevent imminent or life-threatening deterioration of the following conditions:  CNS failure or compromise and trauma   Critical care was time spent personally by me on the following activities:  Development of treatment plan with patient or surrogate,  discussions with consultants, evaluation of patient's response to treatment, examination of patient, ordering and review of laboratory studies, ordering and review of radiographic studies, ordering and performing  treatments and interventions, pulse oximetry, re-evaluation of patient's condition and review of old charts   ED Course and Medical Decision Making  Initial Impression and Ddx Differential diagnosis includes intracranial bleeding.  Patient is on Plavix .  May have orthopedic injuries.  About an hour after arrival here in the emergency department patient began complaining of abdominal pain, obtaining pan scan.  Past medical/surgical history that increases complexity of ED encounter: TIA  Interpretation of Diagnostics I personally reviewed the laboratory assessment and my interpretation is as follows: No significant blood count or electrolyte disturbance  Imaging reveals nasal bone fracture, possible glenoid fracture that is likely acute given the tenderness on exam.  Patient has no tenderness to the first toe and so doubt fracture here.  Patient Reassessment and Ultimate Disposition/Management Clinical Course as of 08/14/23 0619  Thu Aug 14, 2023  0319 Clinical change at 245 persistent speech disturbance unable to name objects, code stroke initiated [MB]    Clinical Course User Index [MB] Theadore Ozell HERO, MD     MRI is negative for acute stroke.  Per neurology recommendations patient is appropriate for outpatient management.  However patient is in independent living at this time requiring a walker and now she will be in a sling for this glenoid fracture.  Will consult TOC for recommendations with regard to escalation of care to SNF for rehab.  Signed out to default provider.  Patient management required discussion with the following services or consulting groups:  Neurology  Complexity of Problems Addressed Acute illness or injury that poses threat of life of bodily  function  Additional Data Reviewed and Analyzed Further history obtained from: Further history from spouse/family member  Additional Factors Impacting ED Encounter Risk Consideration of hospitalization  Ozell HERO. Theadore, MD [facility] [email or web address]  Final Clinical Impressions(s) / ED Diagnoses     ICD-10-CM   1. Anxiety  F41.9     2. Dementia with anxiety, unspecified dementia severity, unspecified dementia type (HCC)  F03.94     3. Closed fracture of nasal bone, initial encounter  S02.2XXA     4. Closed fracture of glenoid cavity and neck of left scapula, initial encounter  D57.857J    D57.847J       ED Discharge Orders     None        Discharge Instructions Discussed with and Provided to Patient:   Discharge Instructions   None      Theadore Ozell HERO, MD 08/14/23 [phone]

## 2023-08-14 NOTE — ED Notes (Signed)
 Family called out, this RN went in to room which is when family noted to this RN that there seems to have been a mental change. NIH was done. Provider notified.

## 2023-08-14 NOTE — ED Notes (Signed)
 ED TO INPATIENT HANDOFF REPORT  ED Nurse Name and Phone #: Jerona ANDREWS  S Name/Age/Gender Jocelyn [name] 89 y.o. female Room/Bed: 040C/040C  Code Status   Code Status: Full Code  Home/SNF/Other Skilled nursing facility Patient oriented to: self, place, and time Is this baseline? Yes   Triage Complete: Triage complete  Chief Complaint Fall [W19.XXXA]  Triage Note Patient BIB [facility] from [facility]. Patient was walking with walker and fell from standing landing on hardwood floor. Patient takes plavix  for previous TIA. Patient denies LOC. Patient has nose deformity and skin tear on left forearm. Patient has hx dementia however is A&Ox4 at this time.   Allergies Allergies  Allergen Reactions   Codeine Itching and Nausea And Vomiting   Penicillins Rash   Tape Rash    Tears the skin    Level of Care/Admitting Diagnosis ED Disposition     ED Disposition  Admit   Condition  --   Comment  Hospital Area: [facility] [[id number]]  Level of Care: Med-Surg [16]  May admit patient to [facility] or [facility] if equivalent level of care is available:: No  Covid Evaluation: Asymptomatic - no recent exposure (last 10 days) testing not required  Diagnosis: Fall [[id number]]  Admitting Physician: ROSAN DAYTON JAYSON LORINE  Attending Physician: ROSAN DAYTON JAYSON [[id number]]  Certification:: I certify this patient will need inpatient services for at least 2 midnights  Expected Medical Readiness: 08/16/2023          B Medical/Surgery History Past Medical History:  Diagnosis Date   HTN (hypertension)    Hyperlipidemia    Urinary incontinence    Past Surgical History:  Procedure Laterality Date   ABDOMINAL HYSTERECTOMY     TONSILLECTOMY       A IV Location/Drains/Wounds Patient Lines/Drains/Airways Status     Active Line/Drains/Airways     Name Placement date Placement time Site Days   Peripheral IV 08/14/23 20 G 1 Right Antecubital 08/14/23  0149   Antecubital  less than 1   Wound / Incision (Open or Dehisced) 06/29/20 Laceration;Other (Comment) Pretibial Right;Mid;Anterior 7.5x2.5x3 cm triangular shaped wound to right mid anterior shin; 2.5x2x2.5 wound below larger wound 06/29/20  1358  Pretibial  1141            Intake/Output Last 24 hours No intake or output data in the 24 hours ending 08/14/23 1517  Labs/Imaging Results for orders placed or performed during the hospital encounter of 08/14/23 (from the past 48 hours)  CBC     Status: None   Collection Time: 08/14/23  1:52 AM  Result Value Ref Range   WBC 8.0 4.0 - 10.5 K/uL   RBC 4.89 3.87 - 5.11 MIL/uL   Hemoglobin 14.6 12.0 - 15.0 g/dL   HCT 56.6 63.9 - 53.9 %   MCV 88.5 80.0 - 100.0 fL   MCH 29.9 26.0 - 34.0 pg   MCHC 33.7 30.0 - 36.0 g/dL   RDW 86.9 88.4 - 84.4 %   Platelets 257 150 - 400 K/uL   nRBC 0.0 0.0 - 0.2 %    Comment: Performed at [facility], [address] [place], [place] [address]  Comprehensive metabolic panel     Status: Abnormal   Collection Time: 08/14/23  1:52 AM  Result Value Ref Range   Sodium 136 135 - 145 mmol/L   Potassium 4.3 3.5 - 5.1 mmol/L   Chloride 102 98 - 111 mmol/L   CO2 23 22 -  32 mmol/L   Glucose, Bld 109 (H) 70 - 99 mg/dL    Comment: Glucose reference range applies only to samples taken after fasting for at least 8 hours.   BUN 16 8 - 23 mg/dL   Creatinine, Ser 8.77 (H) 0.44 - 1.00 mg/dL   Calcium  10.3 8.9 - 10.3 mg/dL   Total Protein 6.2 (L) 6.5 - 8.1 g/dL   Albumin 3.8 3.5 - 5.0 g/dL   AST 15 15 - 41 U/L   ALT 8 0 - 44 U/L   Alkaline Phosphatase 66 38 - 126 U/L   Total Bilirubin 1.0 0.0 - 1.2 mg/dL   GFR, Estimated 42 (L) >60 mL/min    Comment: (NOTE) Calculated using the CKD-EPI Creatinine Equation (2021)    Anion gap 11 5 - 15    Comment: Performed at [facility], [address], [place], [place] [address]  Protime-INR     Status: None   Collection Time: 08/14/23  1:52 AM  Result Value Ref Range    Prothrombin Time 13.7 11.4 - 15.2 seconds   INR 1.0 0.8 - 1.2    Comment: (NOTE) INR goal varies based on device and disease states. Performed at [facility], [address] [place], [place] [address]   CBG monitoring, ED     Status: None   Collection Time: 08/14/23  2:54 AM  Result Value Ref Range   Glucose-Capillary 94 70 - 99 mg/dL    Comment: Glucose reference range applies only to samples taken after fasting for at least 8 hours.  Urinalysis, w/ Reflex to Culture (Infection Suspected) -Urine, Clean Catch     Status: Abnormal   Collection Time: 08/14/23 10:20 AM  Result Value Ref Range   Specimen Source URINE, CLEAN CATCH    Color, Urine YELLOW YELLOW   APPearance CLEAR CLEAR   Specific Gravity, Urine 1.015 1.005 - 1.030   pH 7.0 5.0 - 8.0   Glucose, UA NEGATIVE NEGATIVE mg/dL   Hgb urine dipstick NEGATIVE NEGATIVE   Bilirubin Urine NEGATIVE NEGATIVE   Ketones, ur NEGATIVE NEGATIVE mg/dL   Protein, ur NEGATIVE NEGATIVE mg/dL   Nitrite NEGATIVE NEGATIVE   Leukocytes,Ua TRACE (A) NEGATIVE   RBC / HPF 0-5 0 - 5 RBC/hpf   WBC, UA 0-5 0 - 5 WBC/hpf    Comment:        Reflex urine culture not performed if WBC <=10, OR if Squamous epithelial cells >5. If Squamous epithelial cells >5 suggest recollection.    Bacteria, UA RARE (A) NONE SEEN   Squamous Epithelial / HPF 0-5 0 - 5 /HPF   Budding Yeast PRESENT     Comment: Performed at [facility], [address], [place], [place] [address]   UE VENOUS DUPLEX (7am - 7pm) Result Date: 08/14/2023 UPPER VENOUS STUDY  Patient Name:  Jocelyn [name]  Date of Exam:   08/14/2023 Medical Rec #: [id number]    Accession #:    [id number] Date of Birth: [birth year]   Patient Gender: F Patient Age:   8 years Exam Location:  [facility] Procedure:      VAS US  UPPER EXTREMITY VENOUS DUPLEX Referring Phys: THOM KNAPP --------------------------------------------------------------------------------  Indications: Swelling Comparison  Study: No previous exams Performing Technologist: Jody Hill RVT, RDMS  Examination Guidelines: A complete evaluation includes B-mode imaging, spectral Doppler, color Doppler, and power Doppler as needed of all accessible portions of each vessel. Bilateral testing is considered an integral part of  a complete examination. Limited examinations for reoccurring indications may be performed as noted.  Right Findings: +----------+------------+---------+-----------+----------+-------+ RIGHT     CompressiblePhasicitySpontaneousPropertiesSummary +----------+------------+---------+-----------+----------+-------+ IJV           Full       Yes       Yes                      +----------+------------+---------+-----------+----------+-------+ Subclavian    Full       Yes       Yes                      +----------+------------+---------+-----------+----------+-------+ Axillary      Full       Yes       Yes                      +----------+------------+---------+-----------+----------+-------+ Brachial      Full       Yes       Yes                      +----------+------------+---------+-----------+----------+-------+ Radial        Full                                          +----------+------------+---------+-----------+----------+-------+ Ulnar         Full                                          +----------+------------+---------+-----------+----------+-------+ Cephalic      Full                                          +----------+------------+---------+-----------+----------+-------+ Basilic       Full       Yes       Yes                      +----------+------------+---------+-----------+----------+-------+  Left Findings: +----------+------------+---------+-----------+----------+-------+ LEFT      CompressiblePhasicitySpontaneousPropertiesSummary +----------+------------+---------+-----------+----------+-------+ Subclavian    Full       Yes       Yes                       +----------+------------+---------+-----------+----------+-------+  Summary:  Right: No evidence of deep vein thrombosis in the upper extremity. No evidence of superficial vein thrombosis in the upper extremity.  Left: No evidence of thrombosis in the subclavian.  *See table(s) above for measurements and observations.  Diagnosing physician: Lonni Gaskins MD Electronically signed by Lonni Gaskins MD on 08/14/2023 at 1:48:33 PM.    Final    MR BRAIN WO CONTRAST Result Date: 08/14/2023 CLINICAL DATA:  Neuro deficit with acute stroke suspected EXAM: MRI HEAD WITHOUT CONTRAST TECHNIQUE: Multiplanar, multiecho pulse sequences of the brain and surrounding structures were obtained without intravenous contrast. COMPARISON:  Head CT and CTA from earlier today FINDINGS: Brain: No acute infarction, hemorrhage, hydrocephalus, extra-axial collection or mass lesion. Advanced chronic small vessel ischemic gliosis seen throughout the cerebral white matter and in the pons. Small chronic infarcts in the left cerebellum, right occipital cortex, and left  basal ganglia. Cerebral volume loss is mild and generalized for age. Vascular: Major flow voids are preserved, there was preceding CTA Skull and upper cervical spine: Normal marrow signal. Sinuses/Orbits: Congested appearance of nasal mucosa and partial opacification of the left maxillary sinus, incidental based on history. IMPRESSION: 1. No acute finding. 2. Advanced chronic ischemic injury. Electronically Signed   By: Dorn Roulette M.D.   On: 08/14/2023 05:33   CT CHEST ABDOMEN PELVIS W CONTRAST Result Date: 08/14/2023 CLINICAL DATA:  Polytrauma, blunt.  Code stroke EXAM: CT CHEST, ABDOMEN, AND PELVIS WITH CONTRAST TECHNIQUE: Multidetector CT imaging of the chest, abdomen and pelvis was performed following the standard protocol during bolus administration of intravenous contrast. RADIATION DOSE REDUCTION: This exam was performed according to the  departmental dose-optimization program which includes automated exposure control, adjustment of the mA and/or kV according to patient size and/or use of iterative reconstruction technique. CONTRAST:  75mL OMNIPAQUE  IOHEXOL  350 MG/ML SOLN COMPARISON:  Chest x-ray 05/26/2023, CT chest 06/21/2014 FINDINGS: CHEST: Cardiovascular: No aortic injury. Stable ascending thoracic aorta measuring up to 4.1 cm. The heart is normal in size. No significant pericardial effusion. Severe atherosclerotic plaque. At least 3 vessel coronary calcification. The main pulmonary artery is normal in caliber. No central pulmonary embolus. Mediastinum/Nodes: No pneumomediastinum. No mediastinal hematoma. The esophagus is unremarkable.  Small hiatal hernia. The thyroid  is unremarkable. The central airways are patent. No mediastinal, hilar, or axillary lymphadenopathy. Posterior right diaphragmatic fat containing hernia. Lungs/Pleura: Biapical pleural/pulmonary scarring. No focal consolidation. Chronic stable 1.1 x 0.7 cm right upper lobe pulmonary nodule-no further follow-up indicated. Triangular subpleural micronodule along the left major fissure likely an intrapulmonary lymph node-no further follow-up indicated. Few scattered chronic micronodules bilaterally-no further follow-up indicated. No pulmonary mass. No pulmonary contusion or laceration. No pneumatocele formation. No pleural effusion. No pneumothorax. No hemothorax. Musculoskeletal/Chest wall: No chest wall mass. Diffusely decreased bone density. No acute rib or sternal fracture. No spinal fracture. Chronic T8 compression fracture. ABDOMEN / PELVIS: Hepatobiliary: Not enlarged. No focal lesion. No laceration or subcapsular hematoma. The gallbladder is otherwise unremarkable with no radio-opaque gallstones. No biliary ductal dilatation. Pancreas: Atrophic parenchyma. Scattered calcifications throughout the parenchyma consistent with chronic pancreatitis. Otherwise normal pancreatic  contour. No main pancreatic duct dilatation. Spleen: Not enlarged. No focal lesion. No laceration, subcapsular hematoma, or vascular injury. Adrenals/Urinary Tract: No nodularity bilaterally. Bilateral kidneys enhance symmetrically. No hydronephrosis. No contusion, laceration, or subcapsular hematoma. No injury to the vascular structures or collecting systems. No hydroureter. The urinary bladder is unremarkable. On delayed imaging, there is no urothelial wall thickening and there are no filling defects in the opacified portions of the bilateral collecting systems or ureters. Stomach/Bowel: No small or large bowel wall thickening or dilatation. Colonic diverticulosis. The appendix is unremarkable. Vasculature/Lymphatics: No abdominal aorta or iliac aneurysm. No active contrast extravasation or pseudoaneurysm. No abdominal, pelvic, inguinal lymphadenopathy. Reproductive: Normal. Other: No simple free fluid ascites. No pneumoperitoneum. No hemoperitoneum. No mesenteric hematoma identified. No organized fluid collection. Musculoskeletal: No significant soft tissue hematoma. Tiny fat containing umbilical hernia. Small fat containing left inguinal hernia. Trace fat containing right inguinal hernia. Diffusely decreased bone density. Multilevel degenerative changes spine. No acute pelvic fracture. No spinal fracture. Mild retrolisthesis of L1 on L2. Grade 1 anterolisthesis of L5 on S1. Ports and Devices: None. IMPRESSION: 1. No acute intrathoracic, intra-abdominal, intrapelvic traumatic injury. 2. No acute fracture or traumatic malalignment of the thoracic or lumbar spine in a patient with chronic T8 compression fracture. Other imaging  findings of potential clinical significance: 1. Stable ascending thoracic aorta (4.1 cm). Recommend annual imaging followup by CTA or MRA. This recommendation follows 2010 ACCF/AHA/AATS/ACR/ASA/SCA/SCAI/SIR/STS/SVM Guidelines for the Diagnosis and Management of Patients with Thoracic Aortic  Disease. Circulation. 2010; 121: Z733-z630. Aortic aneurysm NOS (ICD10-I71.9). 2.  Aortic Atherosclerosis (ICD10-I70.0). 3. Colonic diverticulosis with no acute diverticulitis. 4. Small hiatal hernia. 5. Ventral and inguinal fat containing hernias. Electronically Signed   By: Morgane  Naveau M.D.   On: 08/14/2023 03:31   CT ANGIO HEAD NECK W WO CM (CODE STROKE) Result Date: 08/14/2023 CLINICAL DATA:  Neuro deficit, acute, stroke suspected EXAM: CT ANGIOGRAPHY HEAD AND NECK WITH AND WITHOUT CONTRAST TECHNIQUE: Multidetector CT imaging of the head and neck was performed using the standard protocol during bolus administration of intravenous contrast. Multiplanar CT image reconstructions and MIPs were obtained to evaluate the vascular anatomy. Carotid stenosis measurements (when applicable) are obtained utilizing NASCET criteria, using the distal internal carotid diameter as the denominator. RADIATION DOSE REDUCTION: This exam was performed according to the departmental dose-optimization program which includes automated exposure control, adjustment of the mA and/or kV according to patient size and/or use of iterative reconstruction technique. CONTRAST:  75 mL of Omnipaque  350 IV. COMPARISON:  Same day head CT.  CTA head/neck February 01, 2020. FINDINGS: CT HEAD FINDINGS Brain: No evidence of acute large vascular territory infarction, hemorrhage, hydrocephalus, extra-axial collection or mass lesion/mass effect. Similar remote infarcts and chronic microvascular ischemic disease as described on same day CT head. Vascular: See below. Skull: No acute fracture. Sinuses/Orbits: Better evaluated on same day maxillofacial CT. Review of the MIP images confirms the above findings CTA NECK FINDINGS Aortic arch: Great vessel origins are patent without significant stenosis. Aortic atherosclerosis. Right carotid system: Atherosclerosis at the carotid bifurcation and involving the proximal ICA with approximately 60% stenosis of the  proximal ICA. Left carotid system: Atherosclerosis at the carotid bifurcation without greater than 50% stenosis. Vertebral arteries: Left dominant. Both vertebral arteries are patent. Moderate stenosis of the distal right V2 vertebral artery. Skeleton: No acute abnormality on limited assessment. Other neck: No acute abnormality on limited assessment. Multilevel degenerative change. Upper chest: Visualized lung apices are clear. Review of the MIP images confirms the above findings CTA HEAD FINDINGS Anterior circulation: Bilateral intracranial ICAs, MCAs, and ACAs are patent. Similar moderate right paraclinoid ICA stenosis. Chronic stenosis of the right pericallosal artery. Multifocal severe right M2 and moderate left M2 MCA stenosis. Posterior circulation: The intradural vertebral arteries and basilar artery are patent with multifocal mild stenosis. Right intradural vertebral artery is small/non dominant. Short segment occlusion of the left P2 PCA with poor irregular distal opacification, worsened since the prior. Chronic severe multifocal right P2 PCA stenosis with poor distal opacification, progressed. Venous sinuses: Not well evaluated due to arterial timing. Review of the MIP images confirms the above findings IMPRESSION: 1. Short segment occlusion of the left P2 PCA with poor irregular distal opacification, progressed since the prior. 2. Chronic severe multifocal right P2 PCA stenosis with poor distal opacification, progressed since the prior. 3. Multifocal severe right M2 and moderate left M2 MCA stenosis. 4. Similar moderate right paraclinoid ICA stenosis. 5. Approximately 60% stenosis of the proximal right ICA. 6. Moderate stenosis of the distal right V2 vertebral artery. Findings discussed with Dr. Jerrie via telephone at 3:19 a.m. Electronically Signed   By: Gilmore GORMAN Molt M.D.   On: 08/14/2023 03:31   DG Foot Complete Left Result Date: 08/14/2023 CLINICAL DATA:  fall EXAM: LEFT  FOOT - COMPLETE 3+ VIEW  COMPARISON:  None Available. FINDINGS: Vague cortical irregularity of the base of the first digit proximal phalanx. No dislocation. Plantar calcaneal spur. Midfoot mild degenerative changes. Soft tissues are unremarkable. IMPRESSION: Vague cortical irregularity of the base of the first digit proximal phalanx. Correlate with point tenderness to palpation to evaluate for an acute component. Electronically Signed   By: Morgane  Naveau M.D.   On: 08/14/2023 01:13   DG Pelvis Portable Result Date: 08/14/2023 CLINICAL DATA:  Fall EXAM: PORTABLE PELVIS 1-2 VIEWS COMPARISON:  None Available. FINDINGS: Limited evaluation due to overlapping osseous structures and overlying soft tissues. No acute displaced fracture or dislocation of either hips. There is no evidence of pelvic fracture or diastasis. No pelvic bone lesions are seen. IMPRESSION: Negative for acute traumatic injury. Electronically Signed   By: Morgane  Naveau M.D.   On: 08/14/2023 01:11   DG Knee Complete 4 Views Left Result Date: 08/14/2023 CLINICAL DATA:  fall EXAM: LEFT KNEE - COMPLETE 4+ VIEW COMPARISON:  None Available. FINDINGS: Chondrocalcinosis. Mild tricompartmental degenerative changes of the knee. No evidence of fracture, dislocation, or joint effusion. Soft tissues are unremarkable. IMPRESSION: No acute displaced fracture or dislocation. Electronically Signed   By: Morgane  Naveau M.D.   On: 08/14/2023 01:09   DG Chest Portable 1 View Result Date: 08/14/2023 CLINICAL DATA:  Fall Level 2 fall on thinners, bruising to knee and shoulder, pain when touching or moving foot EXAM: PORTABLE CHEST 1 VIEW COMPARISON:  Chest x-ray 05/26/2023, CT chest 06/21/2014 FINDINGS: The heart and mediastinal contours are unchanged. Atherosclerotic plaque. No focal consolidation. No pulmonary edema. No pleural effusion. No pneumothorax. No acute osseous abnormality. IMPRESSION: 1. No active disease. 2.  Aortic Atherosclerosis (ICD10-I70.0). Electronically Signed   By:  Morgane  Naveau M.D.   On: 08/14/2023 01:08   DG Shoulder Left Result Date: 08/14/2023 CLINICAL DATA:  fall EXAM: LEFT SHOULDER - 2+ VIEW COMPARISON:  Chest x-ray 08/14/2023 FINDINGS: Cortical irregularity of the inferior glenoid. No left shoulder dislocation. There is no evidence of arthropathy or other focal bone abnormality. Soft tissues are unremarkable. Atherosclerotic plaque. IMPRESSION: 1. Cortical irregularity of the inferior glenoid. Question acute nondisplaced fracture. 2.  Aortic Atherosclerosis (ICD10-I70.0). Electronically Signed   By: Morgane  Naveau M.D.   On: 08/14/2023 01:07   CT MAXILLOFACIAL WO CONTRAST Result Date: 08/14/2023 CLINICAL DATA:  Facial trauma, blunt; Neck trauma (Age >= 65y); Head trauma, minor (Age >= 65y) EXAM: CT HEAD WITHOUT CONTRAST CT MAXILLOFACIAL WITHOUT CONTRAST CT CERVICAL SPINE WITHOUT CONTRAST TECHNIQUE: Multidetector CT imaging of the head, cervical spine, and maxillofacial structures were performed using the standard protocol without intravenous contrast. Multiplanar CT image reconstructions of the cervical spine and maxillofacial structures were also generated. RADIATION DOSE REDUCTION: This exam was performed according to the departmental dose-optimization program which includes automated exposure control, adjustment of the mA and/or kV according to patient size and/or use of iterative reconstruction technique. COMPARISON:  CT head and C-spine 05/26/2023, CT max face 03/07/2017, CT angio neck 02/01/2020 FINDINGS: CT HEAD FINDINGS Brain: Patchy and confluent areas of decreased attenuation are noted throughout the deep and periventricular white matter of the cerebral hemispheres bilaterally, compatible with chronic microvascular ischemic disease. Right occipital lobe encephalomalacia. No evidence of large-territorial acute infarction. No parenchymal hemorrhage. No mass lesion. No extra-axial collection. No mass effect or midline shift. No hydrocephalus. Basilar  cisterns are patent. Vascular: No hyperdense vessel. Skull: No acute fracture or focal lesion. Other: None. CT MAXILLOFACIAL FINDINGS Osseous:  Acute left nasal bone fracture.  No destructive process. Sinuses/Orbits: Left maxillary and sphenoid sinus mucosal thickening. Paranasal sinuses and mastoid air cells are clear. Bilateral lens replacement. Otherwise the orbits are unremarkable. Soft tissues: Redemonstration of a chronic 3.5 x 2.8 cm right submandibular mass with associated calcifications. CT CERVICAL SPINE FINDINGS Alignment: Normal. Skull base and vertebrae: Multilevel moderate degenerative changes spine. No acute fracture. No aggressive appearing focal osseous lesion or focal pathologic process. Soft tissues and spinal canal: No prevertebral fluid or swelling. No visible canal hematoma. Upper chest: Biapical pleural/pulmonary scarring. Other: Atherosclerotic plaque of the carotid arteries within the neck. IMPRESSION: 1. No acute intracranial abnormality. 2. Acute left nasal bone fracture. 3. No acute displaced fracture or traumatic listhesis of the cervical spine. 4. 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 Incomplete characterization on prior imaging, recommend outpatient MRI neck with and without contrast for further evaluation. Electronically Signed   By: Morgane  Naveau M.D.   On: 08/14/2023 01:05   CT CERVICAL SPINE WO CONTRAST Result Date: 08/14/2023 CLINICAL DATA:  Facial trauma, blunt; Neck trauma (Age >= 65y); Head trauma, minor (Age >= 65y) EXAM: CT HEAD WITHOUT CONTRAST CT MAXILLOFACIAL WITHOUT CONTRAST CT CERVICAL SPINE WITHOUT CONTRAST TECHNIQUE: Multidetector CT imaging of the head, cervical spine, and maxillofacial structures were performed using the standard protocol without intravenous contrast. Multiplanar CT image reconstructions of the cervical spine and maxillofacial structures were also generated. RADIATION DOSE REDUCTION: This exam was performed  according to the departmental dose-optimization program which includes automated exposure control, adjustment of the mA and/or kV according to patient size and/or use of iterative reconstruction technique. COMPARISON:  CT head and C-spine 05/26/2023, CT max face 03/07/2017, CT angio neck 02/01/2020 FINDINGS: CT HEAD FINDINGS Brain: Patchy and confluent areas of decreased attenuation are noted throughout the deep and periventricular white matter of the cerebral hemispheres bilaterally, compatible with chronic microvascular ischemic disease. Right occipital lobe encephalomalacia. No evidence of large-territorial acute infarction. No parenchymal hemorrhage. No mass lesion. No extra-axial collection. No mass effect or midline shift. No hydrocephalus. Basilar cisterns are patent. Vascular: No hyperdense vessel. Skull: No acute fracture or focal lesion. Other: None. CT MAXILLOFACIAL FINDINGS Osseous: Acute left nasal bone fracture.  No destructive process. Sinuses/Orbits: Left maxillary and sphenoid sinus mucosal thickening. Paranasal sinuses and mastoid air cells are clear. Bilateral lens replacement. Otherwise the orbits are unremarkable. Soft tissues: Redemonstration of a chronic 3.5 x 2.8 cm right submandibular mass with associated calcifications. CT CERVICAL SPINE FINDINGS Alignment: Normal. Skull base and vertebrae: Multilevel moderate degenerative changes spine. No acute fracture. No aggressive appearing focal osseous lesion or focal pathologic process. Soft tissues and spinal canal: No prevertebral fluid or swelling. No visible canal hematoma. Upper chest: Biapical pleural/pulmonary scarring. Other: Atherosclerotic plaque of the carotid arteries within the neck. IMPRESSION: 1. No acute intracranial abnormality. 2. Acute left nasal bone fracture. 3. No acute displaced fracture or traumatic listhesis of the cervical spine. 4. 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 Incomplete  characterization on prior imaging, recommend outpatient MRI neck with and without contrast for further evaluation. Electronically Signed   By: Morgane  Naveau M.D.   On: 08/14/2023 01:05   CT HEAD WO CONTRAST ( ) Result Date: 08/14/2023 CLINICAL DATA:  Facial trauma, blunt; Neck trauma (Age >= 65y); Head trauma, minor (Age >= 65y) EXAM: CT HEAD WITHOUT CONTRAST CT MAXILLOFACIAL WITHOUT CONTRAST CT CERVICAL SPINE WITHOUT CONTRAST TECHNIQUE: Multidetector CT imaging of the head, cervical  spine, and maxillofacial structures were performed using the standard protocol without intravenous contrast. Multiplanar CT image reconstructions of the cervical spine and maxillofacial structures were also generated. RADIATION DOSE REDUCTION: This exam was performed according to the departmental dose-optimization program which includes automated exposure control, adjustment of the mA and/or kV according to patient size and/or use of iterative reconstruction technique. COMPARISON:  CT head and C-spine 05/26/2023, CT max face 03/07/2017, CT angio neck 02/01/2020 FINDINGS: CT HEAD FINDINGS Brain: Patchy and confluent areas of decreased attenuation are noted throughout the deep and periventricular white matter of the cerebral hemispheres bilaterally, compatible with chronic microvascular ischemic disease. Right occipital lobe encephalomalacia. No evidence of large-territorial acute infarction. No parenchymal hemorrhage. No mass lesion. No extra-axial collection. No mass effect or midline shift. No hydrocephalus. Basilar cisterns are patent. Vascular: No hyperdense vessel. Skull: No acute fracture or focal lesion. Other: None. CT MAXILLOFACIAL FINDINGS Osseous: Acute left nasal bone fracture.  No destructive process. Sinuses/Orbits: Left maxillary and sphenoid sinus mucosal thickening. Paranasal sinuses and mastoid air cells are clear. Bilateral lens replacement. Otherwise the orbits are unremarkable. Soft tissues: Redemonstration of  a chronic 3.5 x 2.8 cm right submandibular mass with associated calcifications. CT CERVICAL SPINE FINDINGS Alignment: Normal. Skull base and vertebrae: Multilevel moderate degenerative changes spine. No acute fracture. No aggressive appearing focal osseous lesion or focal pathologic process. Soft tissues and spinal canal: No prevertebral fluid or swelling. No visible canal hematoma. Upper chest: Biapical pleural/pulmonary scarring. Other: Atherosclerotic plaque of the carotid arteries within the neck. IMPRESSION: 1. No acute intracranial abnormality. 2. Acute left nasal bone fracture. 3. No acute displaced fracture or traumatic listhesis of the cervical spine. 4. 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 Incomplete characterization on prior imaging, recommend outpatient MRI neck with and without contrast for further evaluation. Electronically Signed   By: Morgane  Naveau M.D.   On: 08/14/2023 01:05    Pending Labs Unresulted Labs (From admission, onward)     Start     Ordered   08/15/23 0500  Basic metabolic panel  Tomorrow morning,   R        08/14/23 1330   08/15/23 0500  CBC  Tomorrow morning,   R        08/14/23 1330            Vitals/Pain Today's Vitals   08/14/23 0827 08/14/23 1343 08/14/23 1415 08/14/23 1455  BP: (!) 144/91  125/77   Pulse: 100  94   Resp: 18  16   Temp: 98.5 F (36.9 C) 98.4 F (36.9 C)    TempSrc: Oral Oral    SpO2: 100%  98%   PainSc:    Asleep    Isolation Precautions No active isolations  Medications Medications  bisacodyl  (DULCOLAX) suppository 10 mg (has no administration in time range)  DULoxetine  (CYMBALTA ) DR capsule 40 mg (40 mg Oral Given 08/14/23 0959)  guaiFENesin  (MUCINEX ) 12 hr tablet 600 mg (600 mg Oral Given 08/14/23 0958)  magnesium  hydroxide (MILK OF MAGNESIA) suspension 30 mL (has no administration in time range)  memantine  (NAMENDA ) tablet 5 mg (5 mg Oral Given 08/14/23 0958)  senna-docusate  (Senokot-S) tablet 1 tablet (1 tablet Oral Given 08/14/23 0958)  fesoterodine  (TOVIAZ ) tablet 4 mg (4 mg Oral Given 08/14/23 0956)  acetaminophen  (TYLENOL ) tablet 650 mg (has no administration in time range)  oxyCODONE  (Oxy IR/ROXICODONE ) immediate release tablet 5 mg (5 mg Oral Given 08/14/23 0959)  nitrofurantoin  (macrocrystal-monohydrate) (MACROBID ) capsule  100 mg (0 mg Oral Hold 08/14/23 1151)  morphine  (PF) 4 MG/ML injection 4 mg (4 mg Intravenous Given 08/14/23 0150)  iohexol  (OMNIPAQUE ) 350 MG/ML injection 75 mL (75 mLs Intravenous Contrast Given 08/14/23 9687)    Mobility walks with device     Focused Assessments     R Recommendations: See Admitting Provider Note  Report given to:   Additional Notes: Call or message with any question.

## 2023-08-14 NOTE — H&P (Signed)
 Date: 08/14/2023               Patient Name:  Jocelyn [name] MRN: [id number]  DOB: [birth year] Age / Sex: 89 y.o., female   PCP: Oneita Charmaine Agent, NP         Medical Service: Internal Medicine Teaching Service         Attending Physician: Dr. Rosan Dayton BROCKS, DO      First Contact: Dr. Damien Lease, DO Pager [phone]    Second Contact: Dr. Ozell Kung, MD Pager [phone]         After Hours (After 5p/  First Contact Pager: [phone]  weekends / holidays): Second Contact Pager: [phone]   SUBJECTIVE   Chief Complaint: fall  History of Present Illness: Jocelyn [name] is an 89 yo female with dementia, overactive bladder, and vit D deficiency, who presented after a fall.  The patient fell after going to the bathroom and she was found down by staff with blood in her hair and around head. It is unclear how long she was down for, but according to the daughter, it was likely <1 hr from what staff had told her. Code stroke was called because of the fall, but MRI was negative and ruled this out.   Of note, the patient is currently being treated for a UTI at [facility], and according to her daughter, often gets episodes of confusion/cognitive changes when she has a UTI. The patient's daughter denies any fevers, chills, chest pain, SOB, abd pain, n/v/d. The patient herself denies any pain, although she was previously endorsing L shoulder pain and L hip pain to her daughter.   Meds:   Macrobid  100 md bid x 5 days (to end 1/3) Mucinex  600 mg BID Cranberry tablets 500 mg daily Dulcolax suppository PRN Meclizine 25 mg PRN (has not needed in >1 month) Clopidogrel  75 mg dailiy Duloxetine  40 mg daily Memantine  5 mg BID Senna-S once daily  Solifenacin  10 mg daily Vit D daily  Current Meds  Medication Sig   acetaminophen  (TYLENOL ) 500 MG tablet Take 1,000 mg by mouth 2 (two) times daily as needed for fever or mild pain (pain score 1-3).   bisacodyl  (DULCOLAX) 10 MG suppository Place 10 mg  rectally as needed for moderate constipation.   cholecalciferol (VITAMIN D3) 25 MCG (1000 UNIT) tablet Take 1,000 Units by mouth daily.   clopidogrel  (PLAVIX ) 75 MG tablet TAKE ONE TABLET BY MOUTH ONE TIME DAILY   Cranberry 500 MG TABS Take 500 mg by mouth daily.   DULoxetine  (CYMBALTA ) 20 MG capsule Take 40 mg by mouth daily.   Emollient (CERAVE MOISTURIZING) CREA Apply 1 application  topically every 12 (twelve) hours as needed (for dryness).   guaiFENesin  (MUCINEX ) 600 MG 12 hr tablet Take 600 mg by mouth 2 (two) times daily.   magnesium  hydroxide (MILK OF MAGNESIA) 400 MG/5ML suspension Take 30 mLs by mouth daily as needed for mild constipation.   meclizine (ANTIVERT) 25 MG tablet Take 25 mg by mouth daily as needed for dizziness.   memantine  (NAMENDA ) 10 MG tablet TAKE ONE TABLET BY MOUTH TWICE DAILY (Patient taking differently: Take 5 mg by mouth 2 (two) times daily.)   nitrofurantoin , macrocrystal-monohydrate, (MACROBID ) 100 MG capsule Take 100 mg by mouth 2 (two) times daily.   sennosides-docusate sodium  (SENOKOT-S) 8.6-50 MG tablet Take 1 tablet by mouth daily.   solifenacin  (VESICARE ) 10 MG tablet TAKE ONE TABLET BY MOUTH ONE TIME DAILY    Past Medical History  Past  Surgical History:  Procedure Laterality Date   ABDOMINAL HYSTERECTOMY     TONSILLECTOMY      Social:  Lives at [facility] x 2 years Occupation: retired Support: family Level of Function: independent of ADLs, uses a walker to ambulate PCP: Through [facility] Substances: Former tobacco user.   Family History: reviewed, no pertinent updates   Allergies: Allergies as of 08/14/2023 - Review Complete 08/14/2023  Allergen Reaction Noted   Codeine Itching and Nausea And Vomiting    Penicillins Rash 04/03/2022   Tape Rash 02/16/2020    Review of Systems: A complete ROS was negative except as per HPI.   OBJECTIVE:   Physical Exam: Blood pressure (!) 144/91, pulse 100, temperature 98.5 F (36.9 C),  temperature source Oral, resp. rate 18, SpO2 100%.   Constitutional: chronically ill-appearing elderly female laying in bed, in no acute distress Cardiovascular: regular rate and rhythm, no m/r/g Pulmonary/Chest: normal work of breathing on room air, lungs clear to auscultation anteriorly Abdominal: soft, non-tender, non-distended MSK: normal bulk and tone Neurological: Somnolent, but wakes to voice. Oriented to self, place, but not to time. Able to follow simple commands and move all extremities spontaneously, but does require frequent redirection.  Skin: bruises in various stages of healing on body Psych: appropriate  Labs: CBC    Component Value Date/Time   WBC 8.0 08/14/2023 0152   RBC 4.89 08/14/2023 0152   HGB 14.6 08/14/2023 0152   HGB 16.2 (H) 03/28/2015 0853   HCT 43.3 08/14/2023 0152   HCT 48.0 (H) 03/28/2015 0853   PLT 257 08/14/2023 0152   PLT 69 (L) 03/28/2015 0853   MCV 88.5 08/14/2023 0152   MCV 91.2 03/28/2015 0853   MCH 29.9 08/14/2023 0152   MCHC 33.7 08/14/2023 0152   RDW 13.0 08/14/2023 0152   RDW 12.8 03/28/2015 0853   LYMPHSABS 1.6 05/26/2023 1136   LYMPHSABS 2.1 03/28/2015 0853   MONOABS 2.0 (H) 05/26/2023 1136   MONOABS 0.5 03/28/2015 0853   EOSABS 0.4 05/26/2023 1136   EOSABS 0.5 03/28/2015 0853   BASOSABS 0.1 05/26/2023 1136   BASOSABS 0.1 03/28/2015 0853     CMP     Component Value Date/Time   NA 136 08/14/2023 0152   NA 141 03/28/2015 0853   K 4.3 08/14/2023 0152   K 4.2 03/28/2015 0853   CL 102 08/14/2023 0152   CO2 23 08/14/2023 0152   CO2 21 (L) 03/28/2015 0853   GLUCOSE 109 (H) 08/14/2023 0152   GLUCOSE 130 03/28/2015 0853   BUN 16 08/14/2023 0152   BUN 10.8 03/28/2015 0853   CREATININE 1.22 (H) 08/14/2023 0152   CREATININE 1.1 03/28/2015 0853   CALCIUM  10.3 08/14/2023 0152   CALCIUM  9.9 03/28/2015 0853   PROT 6.2 (L) 08/14/2023 0152   PROT 6.5 03/28/2015 0853   ALBUMIN 3.8 08/14/2023 0152   ALBUMIN 3.8 03/28/2015 0853   AST  15 08/14/2023 0152   AST 14 03/28/2015 0853   ALT 8 08/14/2023 0152   ALT 14 03/28/2015 0853   ALKPHOS 66 08/14/2023 0152   ALKPHOS 55 03/28/2015 0853   BILITOT 1.0 08/14/2023 0152   BILITOT 0.97 03/28/2015 0853   GFRNONAA 42 (L) 08/14/2023 0152   GFRAA >60 02/01/2020 0034    Imaging: MRI brain: negative CXR: Normal L shoulder xray: possible acute nondisplaced fx of inferior glenoid CT maxillofacial: acute left nasal bone fx, chronic 3.5x2.8 cm R submandibular mass w/ calcifications   EKG: not done  ASSESSMENT & PLAN:  Assessment & Plan by Problem: Principal Problem:   Fall   Jocelyn [name] is a 89 y.o. person living with a history of dementia, overactive bladder, and vit D deficiency who presented with a fall and admitted for fall on hospital day 0  Fall Patient fell and sustained a L glenoid fracture and nasal bone fx. CTH negative for bleed and MRI ruled out stroke. On her exam, she is able to follow simple commands but does require frequent redirection, which her daughter states is not her baseline, but does sometimes occur she gets UTIs. Additionally, given her injuries, she is not safe to be discharged back to [facility] care and will benefit from placement (likely SNF).  - Tylenol  1000 mg q6h PRN for mild pain - Oxycodone  5 mg q6h PRN for severe pain - PT/OT (likely will need SNF placement)  - Continue L shoulder immobilizer   UTI Diagnosed at [facility] and being treated with Macrobid  for 5 days, to end 1/3. Urinalysis here with trace leukocytes, no WBCs or nitrites.   Chronic right submandibular mass Seen on CT imaging dating back to 2021. MRI neck w/ and without contrast recommended for further evaluation.   Dementia Depression Continued home memantine  5 mg bid, duloxetine  40 mg daily.  Overactive bladder On Solifenacin  10 mg daily at home and replaced with fesoteridine 4 mg daily here.   CKD3a Last GFR in October was 40, today is stable at 33. Cr  1.22. - BMP   Diet: Normal VTE: SCDs IVF: None,None Code: Full  Prior to Admission Living Arrangement:  [facility] Anticipated Discharge Location: SNF Barriers to Discharge: PT/OT, medical stability  Dispo: Admit patient to Inpatient with expected length of stay greater than 2 midnights.  Signed: Jeremiah Curci N, DO Internal Medicine Resident PGY-3  08/14/2023, 1:52 PM

## 2023-08-14 NOTE — Progress Notes (Signed)
 RUE venous duplex has been completed.  Preliminary results given to Dr. Lynelle Doctor.    Results can be found under chart review under CV PROC. 08/14/2023 1:14 PM Shany Marinez RVT, RDMS

## 2023-08-14 NOTE — ED Notes (Signed)
 Updated family member at bedside on change of patients plan of care and possible admission

## 2023-08-14 NOTE — Consult Note (Addendum)
 NEUROLOGY CONSULT NOTE   Date of service: August 14, 2023 Patient Name: Jocelyn [name] MRN:  [id number] DOB:  27-Feb-1932 Chief Complaint: Aphasia Requesting Provider: Theadore Ozell HERO, MD  History of Present Illness  Verdene Creson is a 89 y.o. female with a PMhx significant for dementia with sundowning/confusion/wandering (resides in SNF), hypertension, hyperlipidemia, stage IIIb chronic kidney disease, mood disorder, generalized anxiety disorder, frequent falls  She had a fall at her facility today and on arrival to the ED was initially fully oriented and alert.   While in the ED she suddenly became acutely confused, was having difficulty naming even simple objects such as pen, seemed generally weak, and code stroke was activated  She follows with [facility] for her history of stroke and dementia; last MMSE 22/30 (prior 20/30)  LKW: 2:45 AM Modified rankin score: 4-Needs assistance to walk and tend to bodily needs IV Thrombolysis: No, symptoms improving, risk given recent fall and severe hematomas and pelvic pain felt to outweigh benefit EVT: No, symptoms too mild and baseline MRS  NIHSS components Score: Comment  1a Level of Conscious 0[x]  1[]  2[]  3[]      1b LOC Questions 0[]  1[x]  2[]       1c LOC Commands 0[x]  1[]  2[]       2 Best Gaze 0[x]  1[]  2[]       3 Visual 0[x]  1[]  2[]  3[]      4 Facial Palsy 0[x]  1[]  2[]  3[]      5a Motor Arm - left 0[x]  1[]  2[]  3[]  4[]  UN[]    5b Motor Arm - Right 0[x]  1[]  2[]  3[]  4[]  UN[]    6a Motor Leg - Left 0[x]  1[]  2[]  3[]  4[]  UN[]    6b Motor Leg - Right 0[x]  1[]  2[]  3[]  4[]  UN[]    7 Limb Ataxia 0[x]  1[]  2[]  3[]  UN[]     8 Sensory 0[x]  1[]  2[]  UN[]      9 Best Language 0[]  1[x]  2[]  3[]      10 Dysarthria 0[x]  1[]  2[]  UN[]      11 Extinct. and Inattention 0[x]  1[]  2[]       TOTAL:       ROS  Unable to assess secondary to patient's mental status   Past History   Past Medical History:  Diagnosis Date   HTN (hypertension)     Hyperlipidemia    Urinary incontinence     Past Surgical History:  Procedure Laterality Date   ABDOMINAL HYSTERECTOMY     TONSILLECTOMY      Family History: Family History  Problem Relation Age of Onset   Diabetes Other        1st degree relative   Hyperlipidemia Other    Stroke Other        1st degree Female relative <50   Cancer Neg Hx    Early death Neg Hx    Heart disease Neg Hx    Hypertension Neg Hx    Kidney disease Neg Hx     Social History  reports that she has quit smoking. She quit smokeless tobacco use about 6 years ago. She reports current alcohol use of about 14.0 standard drinks of alcohol per week. She reports that she does not use drugs.  Allergies  Allergen Reactions   Codeine Itching and Nausea And Vomiting   Tape Rash    Tears the skin    Medications   Current Facility-Administered Medications:    iohexol  (OMNIPAQUE ) 350 MG/ML injection 75 mL, 75 mL, Intravenous, Once PRN, Bero, Michael M, MD  Current  Outpatient Medications:    atorvastatin  (LIPITOR) 40 MG tablet, TAKE ONE TABLET BY MOUTH ONE TIME DAILY, Disp: 90 tablet, Rfl: 0   clopidogrel  (PLAVIX ) 75 MG tablet, TAKE ONE TABLET BY MOUTH ONE TIME DAILY, Disp: 90 tablet, Rfl: 0   collagenase  (SANTYL ) ointment, Apply 1 application topically daily., Disp: 15 g, Rfl: 0   Cyanocobalamin  (B-12 COMPLIANCE INJECTION IJ), Inject 1 Dose as directed every 30 (thirty) days., Disp: , Rfl:    diazepam  (VALIUM ) 2 MG tablet, Take 1 tablet (2 mg total) by mouth every 8 (eight) hours as needed for anxiety., Disp: 90 tablet, Rfl: 3   DULoxetine  (CYMBALTA ) 60 MG capsule, TAKE ONE CAPSULE BY MOUTH ONE TIME DAILY, Disp: 90 capsule, Rfl: 0   memantine  (NAMENDA ) 10 MG tablet, TAKE ONE TABLET BY MOUTH TWICE DAILY, Disp: 180 tablet, Rfl: 0   nitrofurantoin , macrocrystal-monohydrate, (MACROBID ) 100 MG capsule, Take 100 mg by mouth 2 (two) times daily., Disp: , Rfl:    solifenacin  (VESICARE ) 10 MG tablet, TAKE ONE TABLET BY  MOUTH ONE TIME DAILY, Disp: 90 tablet, Rfl: 1   white petrolatum (VASELINE) GEL, Apply 1 application. topically as needed for lip care., Disp: , Rfl:   Vitals   Vitals:   09/02/23 0022 02-Sep-2023 0025 09-02-23 0027 09/02/23 0115  BP:  (!) 198/102  (!) 165/79  Pulse:  68  71  Resp:  14  15  Temp:  98.5 F (36.9 C)    TempSrc:  Oral    SpO2: 100% 100% 100% 100%    There is no height or weight on file to calculate BMI.  Physical Exam   Constitutional: Appears frail Psych: Affect anxious but redirectable, cooperative, pleasant HENT: Nasal hematoma and left suborbital hematoma Cardiovascular: Normal rate and regular rhythm.  Respiratory: Effort normal, non-labored breathing GI: Reporting significant pelvic discomfort Skin: Multiple bruises in various stages of healing throughout  Neurologic Examination   Neuro: Mental Status: Patient is awake, alert, oriented to person, incorrectly states age as 52, correctly states month is January, unable to provide significant details.  Occasional word finding difficulty and paraphasic/phonemic errors, but fairly mild aphasia (family agrees, nondisabling given her baseline) Cranial Nerves: II: Visual Fields are full to orienting to stimuli in all quadrants.  III,IV, VI: EOMI without ptosis or diploplia.  V: Facial sensation is symmetric to light touch VII: Facial movement is symmetric.  VIII: hearing is intact to voice Motor: No drift in all 4 extremities Sensory: Sensation is symmetric to light touch in all 4 extremities Cerebellar: FNF and HKS are intact bilaterally   Labs/Imaging/Neurodiagnostic studies   CBC:  Recent Labs  Lab Sep 02, 2023 0152  WBC 8.0  HGB 14.6  HCT 43.3  MCV 88.5  PLT 257   Basic Metabolic Panel:  Lab Results  Component Value Date   NA 136 09-02-23   K 4.3 2023-09-02   CO2 23 09-02-2023   GLUCOSE 109 (H) 09-02-23   BUN 16 09/02/23   CREATININE 1.22 (H) 2023/09/02   CALCIUM  10.3 September 02, 2023    GFRNONAA 42 (L) 09/02/23   GFRAA >60 02/01/2020   Lipid Panel:  Lab Results  Component Value Date   LDLCALC 55 03/11/2022   HgbA1c:  Lab Results  Component Value Date   HGBA1C 4.7 (L) 02/01/2020   INR  Lab Results  Component Value Date   INR 1.0 2023/09/02   APTT  Lab Results  Component Value Date   APTT 29 01/31/2020   AED levels: No results found for:  PHENYTOIN, ZONISAMIDE, LAMOTRIGINE, LEVETIRACETA  CT Head without contrast(Personally reviewed): No acute intracranial process  CT angio Head and Neck with contrast(Personally reviewed): 1. Short segment occlusion of the left P2 PCA with poor irregular distal opacification, progressed since the prior. 2. Chronic severe multifocal right P2 PCA stenosis with poor distal opacification, progressed since the prior. 3. Multifocal severe right M2 and moderate left M2 MCA stenosis. 4. Similar moderate right paraclinoid ICA stenosis. 5. Approximately 60% stenosis of the proximal right ICA. 6. Moderate stenosis of the distal right V2 vertebral artery.  MRI Brain: Pending  ASSESSMENT   Code stroke activated for acute speech difficulty.  Certainly given her age and history she is at risk of stroke.  However at this time her symptoms are too mild to treat especially given significant risk of bleeding with her recent falls.  Discussed with family, certainly even if MRI is negative this could represent a TIA.  However they would not want to escalate her antiplatelet regimen, and would be very hesitant to pursue anticoagulation as well --would prefer to hold off on full TIA/stroke workup unless MRI brain is positive  Differential would also include delirium in the setting of hospitalization/pain/UTI, anxiety related speech changes, dementia related fluctuation  RECOMMENDATIONS  -MRI brain -If this is negative no other inpatient neurological workup -If positive, note will be addended to reflect any changes in  plan  Addendum: MRI brain negative -- neurology will sign off  ______________________________________________________________________   Signed, Lola LITTIE Jernigan, MD [facility]  CRITICAL CARE Performed by: Lola LITTIE Jernigan   Total critical care time: 35 minutes  Critical care time was exclusive of separately billable procedures and treating other patients.  Critical care was necessary to treat or prevent imminent or life-threatening deterioration.  Critical care was time spent personally by me on the following activities: development of treatment plan with patient and/or surrogate as well as nursing, discussions with consultants, evaluation of patient's response to treatment, examination of patient, obtaining history from patient or surrogate, ordering and performing treatments and interventions, ordering and review of laboratory studies, ordering and review of radiographic studies, pulse oximetry and re-evaluation of patient's condition.

## 2023-08-14 NOTE — ED Triage Notes (Signed)
 Patient BIB [facility] from [facility]. Patient was walking with walker and fell from standing landing on hardwood floor. Patient takes plavix  for previous TIA. Patient denies LOC. Patient has nose deformity and skin tear on left forearm. Patient has hx dementia however is A&Ox4 at this time.

## 2023-08-14 NOTE — Progress Notes (Signed)
 Patient here from [facility] on [address]. Patient has traditional Medicare and is not active with [facility] so she will not be able to go to SNF for rehab at this time as she does not have a qualifying stay. Patient will have to return to [facility] with home health services.  Niels Portugal, MSW, LCSW Transitions of Care  Clinical Social Worker II [phone]

## 2023-08-14 NOTE — Progress Notes (Signed)
 OT Cancellation Note  Patient Details Name: Jocelyn [name] MRN: [id number] DOB: 09-07-1931   Cancelled Treatment:    Reason Eval/Treat Not Completed: Other (comment) Order placed as imminent however does not appear to be imminent. (Discussed with CM. Pt likely to be admitted. will follow up tomorrow as schedule allows.) If OT eval needed before then please call [phone] or secure chat OT @ [facility] OT. thanks  [facility] 08/14/2023, 2:58 PM Kreg Sink, OT/L   Acute OT Clinical Specialist Acute Rehabilitation Services Pager [phone] Office [phone]

## 2023-08-14 NOTE — ED Notes (Signed)
 Patient cleaned and changed. Dressing applied to skin tear on left forearm.

## 2023-08-14 NOTE — Code Documentation (Signed)
 Stroke Response Nurse Documentation Code Documentation  Jocelyn [name] is a 89 y.o. female arriving to [facility]  via [facility] on 1/2 with past medical hx of dementia, HTN, HLD, CKD, anxiety. On clopidogrel  75 mg daily. Code stroke was activated by ED.   Patient from SNF where she fell earlier today and brought to the hospital. While in the ED, she suddenly became confused and had difficulty naming objects/family. LKW at 0245 and now complaining of Aphasia .   Stroke team at the bedside on patient arrival. Labs drawn and patient cleared for CT by Dr. Theadore. Patient to CT with team. NIHSS 2, see documentation for details and code stroke times. Patient with disoriented and Expressive aphasia  on exam. The following imaging was completed:  CT Head and CTA. Patient is not a candidate for IV Thrombolytic due to resolving symptoms. Patient is not a candidate for IR due to mild symptoms and baseline MRS.   Care Plan: neuro checks q2.   Bedside handoff with ED RN Milo.    Griselda Alm ORN  Rapid Response RN

## 2023-08-14 NOTE — Progress Notes (Signed)
   08/13/23 0008  Spiritual Encounters  Type of Visit Initial  Care provided to: Pt not available  Conversation partners present during encounter Nurse;Other (comment)  Referral source Trauma page  Reason for visit Trauma  OnCall Visit Yes   Responded to level 2 trauma FOT, no family present. Patient from SNF. Patient unavailable.

## 2023-08-14 NOTE — Evaluation (Signed)
 Physical Therapy Evaluation Patient Details Name: Jocelyn [name] MRN: [id number] DOB: 1932/07/02 Today's Date: 08/14/2023  History of Present Illness  Patient is 89 y.o. female from [facility] where she fell earlier today and brought to the ED for Lt shoulder, knee, foot pain and head trauma w/ nose pain. Spinal imaging indicates no acute injuries of cervical, thoracic, or lumbar spine w/ chronic T8 compression fracture. Imaging of Lt UE/LE reveal Lt closed fracture of glenoid cavity and neck of left scapula. Imaging of Lt foot shows vague cortical irregularity at base of the first proximal phalanx but not fx confirmed. Pt also found to have nasal bone fx. While in the ED, pt suddenly became confused w/ difficulty naming objects/family. Code Stroke activated and Brain MRI negative for acute abnormality. PMH significant for dementia with sundowning/confusion/wandering (resides in SNF), HTN, HLD, stage IIIb CKD, mood disorder, generalized anxiety disorder, frequent falls.   Clinical Impression  Jocelyn [name] is 89 y.o. female admitted with above HPI and diagnosis. Patient is currently limited by functional impairments below (see PT problem list). Patient is a resident at [facility], memory care unit, and is mod ind with RW for transfers and gait with min assist for dressing and bathing at baseline. Patient currently requires Mod assist for bed mobility and Min assist for sit<>stand from elevated surface. Pt required mod assist to stabilize standing balance and weight shift for small lateral steps along EOB. Pt is mobilizing well below baseline of Mod ind PTA. Patient will benefit from continued skilled PT interventions to address impairments and progress independence with mobility, recommending. Patient will benefit from continued inpatient follow up therapy, <3 hours/day. Acute PT will follow and progress as able.         If plan is discharge home, recommend the following: A lot of help with walking  and/or transfers;A lot of help with bathing/dressing/bathroom;Assistance with cooking/housework;Assist for transportation;Help with stairs or ramp for entrance;Supervision due to cognitive status;Direct supervision/assist for medications management   Can travel by private vehicle   No    Equipment Recommendations  (defer to facility)  Recommendations for Other Services  OT consult    Functional Status Assessment Patient has had a recent decline in their functional status and demonstrates the ability to make significant improvements in function in a reasonable and predictable amount of time.     Precautions / Restrictions Precautions Precautions: Fall Precaution Comments: last 6 months at least 8-9 falls; falls hapen when not using RW. Required Braces or Orthoses: Sling Restrictions Weight Bearing Restrictions Per Provider Order: Yes LUE Weight Bearing Per Provider Order: Non weight bearing      Mobility  Bed Mobility Overal bed mobility: Needs Assistance Bed Mobility: Rolling, Supine to Sit, Sit to Supine Rolling: Min assist   Supine to sit: Min assist, Mod assist, HOB elevated Sit to supine: Mod assist, +2 for safety/equipment        Transfers Overall transfer level: Needs assistance Equipment used: 1 person hand held assist Transfers: Sit to/from Stand Sit to Stand: Min assist, From elevated surface (stretcher height)           General transfer comment: Min assist for rise/lower from stretcher. pt holding therapist with Rt UE, NWB on Lt UE in sling. Pt required assist to reposition bil LE's for stand as placing Rt foot too far anteriorly. Mod assist to weight shift Rt/Lt for small steps along EOB. pt able to take 1 with each LE.    Ambulation/Gait  Stairs            Wheelchair Mobility     Tilt Bed    Modified Rankin (Stroke Patients Only)       Balance Overall balance assessment: Needs assistance, History of  Falls Sitting-balance support: Single extremity supported, Feet unsupported Sitting balance-Leahy Scale: Fair Sitting balance - Comments: CGA for safety   Standing balance support: Single extremity supported, During functional activity Standing balance-Leahy Scale: Poor Standing balance comment: reliant on support of therapist                             Pertinent Vitals/Pain Pain Assessment Pain Assessment: Faces Faces Pain Scale: Hurts little more Pain Location: Lt shoulder Pain Descriptors / Indicators: Discomfort, Grimacing Pain Intervention(s): Limited activity within patient's tolerance, Monitored during session, Repositioned    Home Living Family/patient expects to be discharged to:: Assisted living (lives at memory care unit [facility]; granddaughter HCPOA)                 Home Equipment: [employer] (2 wheels);Wheelchair - manual [facility] can be pedaled with feet or UE push) Additional Comments: had a fall and Rt foot fx previously and pt used WC at that time    Prior Function Prior Level of Function : Needs assist       Physical Assist : ADLs (physical);Mobility (physical)   ADLs (physical): Dressing;Toileting;Bathing Mobility Comments: has halo rail on bed to get in/out, very mobile with RW to stand and amb in facility ADLs Comments: aids/staff assists with dressing in morning and evening. pt does toileting mod Ind, unless bad day with vertigo and can't do it herself.     Extremity/Trunk Assessment   Upper Extremity Assessment Upper Extremity Assessment: Generalized weakness;LUE deficits/detail;Right hand dominant;RUE deficits/detail RUE Deficits / Details: edema noted in UE more distal than proximal LUE Deficits / Details: sling repositioned at EOB,    Lower Extremity Assessment Lower Extremity Assessment: LLE deficits/detail;RLE deficits/detail;Generalized weakness RLE Deficits / Details: grossly 3+/5 with ability to complete SLR and  functional sit<>stand as well as bridge in bed LLE Deficits / Details: grossly 3+/5 with ability to complete SLR and functional sit<>stand as well as bridge in bed    Cervical / Trunk Assessment Cervical / Trunk Assessment: Kyphotic  Communication   Communication Communication: Hearing impairment  Cognition Arousal: Lethargic, Alert Behavior During Therapy: WFL for tasks assessed/performed, Flat affect Overall Cognitive Status: History of cognitive impairments - at baseline                                 General Comments: Lethargic at start but alert once engaged in mobility. pt with baseline cog/memory deficits.        General Comments      Exercises     Assessment/Plan    PT Assessment Patient needs continued PT services  PT Problem List Decreased strength;Decreased range of motion;Decreased activity tolerance;Decreased balance;Decreased mobility;Decreased coordination;Decreased cognition;Decreased knowledge of use of DME;Decreased safety awareness;Decreased knowledge of precautions;Pain       PT Treatment Interventions DME instruction;Gait training;Stair training;Functional mobility training;Therapeutic activities;Therapeutic exercise;Balance training;Neuromuscular re-education;Cognitive remediation;Patient/family education;Wheelchair mobility training    PT Goals (Current goals can be found in the Care Plan section)  Acute Rehab PT Goals Patient Stated Goal: recover from fall and get mobile PT Goal Formulation: With patient/family Time For Goal Achievement: 08/28/23 Potential to Achieve Goals: Good  Frequency Min 1X/week     Co-evaluation               AM-PAC PT 6 Clicks Mobility  Outcome Measure Help needed turning from your back to your side while in a flat bed without using bedrails?: A Little Help needed moving from lying on your back to sitting on the side of a flat bed without using bedrails?: A Lot Help needed moving to and from a  bed to a chair (including a wheelchair)?: A Lot Help needed standing up from a chair using your arms (e.g., wheelchair or bedside chair)?: A Little Help needed to walk in hospital room?: Total Help needed climbing 3-5 steps with a railing? : Total 6 Click Score: 12    End of Session Equipment Utilized During Treatment: Gait belt;Other (comment) (Lt UE sling) Activity Tolerance: Patient tolerated treatment well Patient left: in bed;with call bell/phone within reach;with family/visitor present Nurse Communication: Mobility status PT Visit Diagnosis: Other abnormalities of gait and mobility (R26.89);Muscle weakness (generalized) (M62.81);History of falling (Z91.81);Difficulty in walking, not elsewhere classified (R26.2);Pain Pain - Right/Left: Left Pain - part of body: Hip;Knee;Shoulder    Time: 8983-8951 PT Time Calculation (min) (ACUTE ONLY): 32 min   Charges:   PT Evaluation $PT Eval Moderate Complexity: 1 Mod PT Treatments $Therapeutic Activity: 8-22 mins PT General Charges $$ ACUTE PT VISIT: 1 Visit         Vernell DONEEN KLEIN, DPT Acute Rehabilitation Services Office [phone]  08/14/23 11:03 AM

## 2023-08-14 NOTE — ED Notes (Signed)
 Patient turned and changed.

## 2023-08-14 NOTE — ED Notes (Signed)
 Patient transported to MRI

## 2023-08-14 NOTE — ED Notes (Signed)
 Patient transported to CT

## 2023-08-14 NOTE — ED Notes (Signed)
 [facility] updated on patient status.

## 2023-08-14 NOTE — ED Notes (Signed)
 Pt transported to Vascular

## 2023-08-15 DIAGNOSIS — S42152A Displaced fracture of neck of scapula, left shoulder, initial encounter for closed fracture: Secondary | ICD-10-CM

## 2023-08-15 DIAGNOSIS — N1831 Chronic kidney disease, stage 3a: Secondary | ICD-10-CM

## 2023-08-15 DIAGNOSIS — R220 Localized swelling, mass and lump, head: Secondary | ICD-10-CM | POA: Diagnosis present

## 2023-08-15 DIAGNOSIS — S42142A Displaced fracture of glenoid cavity of scapula, left shoulder, initial encounter for closed fracture: Secondary | ICD-10-CM

## 2023-08-15 DIAGNOSIS — F0390 Unspecified dementia without behavioral disturbance: Secondary | ICD-10-CM

## 2023-08-15 DIAGNOSIS — R296 Repeated falls: Secondary | ICD-10-CM | POA: Diagnosis not present

## 2023-08-15 DIAGNOSIS — Z8673 Personal history of transient ischemic attack (TIA), and cerebral infarction without residual deficits: Secondary | ICD-10-CM

## 2023-08-15 DIAGNOSIS — W19XXXA Unspecified fall, initial encounter: Secondary | ICD-10-CM

## 2023-08-15 DIAGNOSIS — I712 Thoracic aortic aneurysm, without rupture, unspecified: Secondary | ICD-10-CM

## 2023-08-15 DIAGNOSIS — S022XXA Fracture of nasal bones, initial encounter for closed fracture: Secondary | ICD-10-CM

## 2023-08-15 LAB — BASIC METABOLIC PANEL
Anion gap: 11 (ref 5–15)
BUN: 23 mg/dL (ref 8–23)
CO2: 24 mmol/L (ref 22–32)
Calcium: 10.1 mg/dL (ref 8.9–10.3)
Chloride: 100 mmol/L (ref 98–111)
Creatinine, Ser: 1.2 mg/dL — ABNORMAL HIGH (ref 0.44–1.00)
GFR, Estimated: 43 mL/min — ABNORMAL LOW (ref >60)
Glucose, Bld: 115 mg/dL — ABNORMAL HIGH (ref 70–99)
Potassium: 5 mmol/L (ref 3.5–5.1)
Sodium: 135 mmol/L (ref 135–145)

## 2023-08-15 LAB — CBC
HCT: 41.1 % (ref 36.0–46.0)
Hemoglobin: 13.6 g/dL (ref 12.0–15.0)
MCH: 29.5 pg (ref 26.0–34.0)
MCHC: 33.1 g/dL (ref 30.0–36.0)
MCV: 89.2 fL (ref 80.0–100.0)
Platelets: 254 10*3/uL (ref 150–400)
RBC: 4.61 MIL/uL (ref 3.87–5.11)
RDW: 13.1 % (ref 11.5–15.5)
WBC: 8.9 10*3/uL (ref 4.0–10.5)
nRBC: 0 % (ref 0.0–0.2)

## 2023-08-15 MED ORDER — HYDROXYZINE HCL 10 MG PO TABS: 10.0000 mg | ORAL_TABLET | Freq: Three times a day (TID) | ORAL | Status: DC | PRN

## 2023-08-15 MED ORDER — HEPARIN SODIUM (PORCINE) 5000 UNIT/ML IJ SOLN
5000.0000 [IU] | Freq: Three times a day (TID) | INTRAMUSCULAR | Status: DC
  Administered 2023-08-15 – 2023-08-17 (×6): 5000 [IU] via SUBCUTANEOUS
  Filled 2023-08-15 (×6): qty 1

## 2023-08-15 MED ORDER — ACETAMINOPHEN 500 MG PO TABS
1000.0000 mg | ORAL_TABLET | Freq: Four times a day (QID) | ORAL | Status: DC
  Administered 2023-08-15 – 2023-08-17 (×9): 1000 mg via ORAL
  Filled 2023-08-15 (×10): qty 2

## 2023-08-15 MED ORDER — CLOPIDOGREL BISULFATE 75 MG PO TABS
75.0000 mg | ORAL_TABLET | Freq: Every day | ORAL | Status: DC
  Administered 2023-08-15 – 2023-08-17 (×3): 75 mg via ORAL
  Filled 2023-08-15 (×3): qty 1

## 2023-08-15 MED ORDER — LIDOCAINE 5 % EX PTCH
1.0000 | MEDICATED_PATCH | CUTANEOUS | Status: DC
  Administered 2023-08-15 – 2023-08-17 (×3): 1 via TRANSDERMAL
  Filled 2023-08-15 (×3): qty 1

## 2023-08-15 NOTE — Care Management CC44 (Signed)
 Condition Code 44 Documentation Completed  Patient Details  Name: Falen Lehrmann MRN: [id number] Date of Birth: 1932-07-25   Condition Code 44 given:  Yes Patient signature on Condition Code 44 notice:  Yes Documentation of 2 MD's agreement:  Yes Code 44 added to claim:  Yes    Stephane Powell Jansky, RN 08/15/2023, 1:25 PM

## 2023-08-15 NOTE — Progress Notes (Signed)
 Subjective: Jocelyn [name] is a 89 y.o. person living with a history of dementia, overactive bladder, and vit D deficiency who presented with a fall and admitted for fall.  Today, she reports a lot of facial and Left shoulder pain. Except for pain, she denies acute complaints at this time.   Objective:  Vital signs in last 24 hours: Vitals:   08/14/23 2006 08/14/23 2321 08/15/23 0441 08/15/23 0825  BP: 118/71 138/75 (!) 150/77 129/79  Pulse: 88 78 75 84  Resp:  16  17  Temp: 97.8 F (36.6 C) 98 F (36.7 C) 98 F (36.7 C) (!) 97.5 F (36.4 C)  TempSrc: Oral     SpO2: 99% 92% 100%    Physical Exam: General:NAD, sitting in bed  HEENT: intraorbital ecchymosis present bilaterally, edema present on nasal bridge   Cardiac:RRR, no murmurs present  Pulmonary:clear to auscultate, normal effort on RA Neuro:A&O to self and place and month  Skin:Warm and dry, numerous ecchymosis on extremities  MSK: Left arm in sling, radial pulses are present and symmetrical bilaterally  Psych:  normal mood and affect     Latest Ref Rng & Units 08/14/2023    1:52 AM 05/26/2023   11:36 AM 03/11/2022    9:43 AM  CBC  WBC 4.0 - 10.5 K/uL 8.0  15.7  5.6   Hemoglobin 12.0 - 15.0 g/dL 85.3  86.3  86.9   Hematocrit 36.0 - 46.0 % 43.3  40.6  38.5   Platelets 150 - 400 K/uL 257  324  234.0        Latest Ref Rng & Units 08/14/2023    1:52 AM 05/26/2023   11:36 AM 03/11/2022    9:43 AM  BMP  Glucose 70 - 99 mg/dL 890  897  86   BUN 8 - 23 mg/dL 16  23  14    Creatinine 0.44 - 1.00 mg/dL 8.77  8.71  8.84   Sodium 135 - 145 mmol/L 136  137  141   Potassium 3.5 - 5.1 mmol/L 4.3  4.4  4.1   Chloride 98 - 111 mmol/L 102  99  106   CO2 22 - 32 mmol/L 23  23  27    Calcium  8.9 - 10.3 mg/dL 89.6  89.6  9.9      Assessment/Plan:  Principal Problem:   Fall Active Problems:   Depression   UTI (urinary tract infection)   Overactive bladder   Glenoid fracture of shoulder, left, closed, initial encounter    Nasal bone fracture   Mass of submandibular region   Dementia (HCC)   CKD stage 3a, GFR 45-59 ml/min (HCC)   Thoracic aortic aneurysm (HCC)   History of ischemic right PCA stroke  Fall Fractured L glenoid Fractured nasal bone  Patient fell and sustained a L glenoid fracture and nasal bone fx. We spoke with both patient and her daughter (on the phone) who would like to change her code status to DNR/DNI. Patient was evaluated by PT who recommends SNF placement.  Plan: -Will schedule tylenol  1000 mg q6 hours -Oxycodone  5 mg q6h PRN for severe pain  -Continue to work with social work for SNF placement  -Continue sling for shoulder immobilization  -PT/OT -code status changed to DNR/DNI  UTI Diagnosed at [facility] and being treated with Macrobid  for 5 days, to end 1/3  Chronic right submandibular mass Seen on CT imaging dating back to 2021. MRI neck w/ and without contrast recommended for further evaluation.  Dementia Depression Continued home memantine  5 mg bid, duloxetine  40 mg daily.   Overactive bladder On Solifenacin  10 mg daily at home and replaced with fesoteridine 4 mg daily here.    CKD3a Stable   History of ischemic R PCA stroke -Begin Plavix  today   Resolved Problems:   __________________________________ Prior to Admission Living Arrangement:ALF Anticipated Discharge Location:SNF Barriers to Discharge:SNF placement  Dispo: Anticipated discharge in approximately 1 day(s).   Kandis Perkins, DO 08/15/2023, 12:12 PM After 5pm on weekdays and 1pm on weekends: On Call pager [phone]

## 2023-08-15 NOTE — Evaluation (Addendum)
 Occupational Therapy Evaluation Patient Details Name: Jocelyn [name] MRN: [id number] DOB: Sep 30, 1931 Today's Date: 08/15/2023   History of Present Illness Patient is 89 y.o. female from [facility] where she fell earlier today and brought to the ED for Lt shoulder, knee, foot pain and head trauma w/ nose pain. Spinal imaging indicates no acute injuries of cervical, thoracic, or lumbar spine w/ chronic T8 compression fracture. Imaging of Lt UE/LE reveal Lt closed fracture of glenoid cavity and neck of left scapula. Imaging of Lt foot shows vague cortical irregularity at base of the first proximal phalanx but not fx confirmed. Pt also found to have nasal bone fx. While in the ED, pt suddenly became confused w/ difficulty naming objects/family. Code Stroke activated and Brain MRI negative for acute abnormality. PMH significant for dementia with sundowning/confusion/wandering (resides in SNF), HTN, HLD, stage IIIb CKD, mood disorder, generalized anxiety disorder, frequent falls.   Clinical Impression   Canaan was evaluated s/p the above admission list. She is from [facility] and is mod I with most tasks at baseline, she has had several recent falls and staff assists with ADLs and IADLs as needed. Upon evaluation the pt was limited by pain, generalized weakness, unsteady gait and limited activity tolerance. Overall she needed min A for bed mobility and CGA for transfers with RW. Due to the deficits listed below the pt also needs up to min A for LB ADLs and set up A for UB ADLs in sitting. Sling removed for session, pt tolerated functional use and ROM of LUE and LLE. Anticipate pt will have great continued progress acutely and be able to discharge back to current ALF/memory care with [facility] for further therapy in pt's natural setting.   Confirmed LUE and LUE WBAT with Hoffman, MD via secure chat 1/3, sling for comfort, ROM as tolerated.         If plan is discharge home, recommend the following: A  little help with walking and/or transfers;A little help with bathing/dressing/bathroom;Assistance with cooking/housework;Direct supervision/assist for medications management;Help with stairs or ramp for entrance;Assist for transportation;Direct supervision/assist for financial management;Supervision due to cognitive status    Functional Status Assessment  Patient has had a recent decline in their functional status and demonstrates the ability to make significant improvements in function in a reasonable and predictable amount of time.  Equipment Recommendations  None recommended by OT       Precautions / Restrictions Precautions Precautions: Fall Precaution Comments: last 6 months at least 8-9 falls; falls hapen when not using RW. Required Braces or Orthoses: Sling (for comfort per Hoffoman, MD via secure chat 1/3) Restrictions Weight Bearing Restrictions Per Provider Order: Yes LUE Weight Bearing Per Provider Order: Weight bearing as tolerated (for comfort per Hoffoman, MD via secure chat 1/3 - confirmed with ortho)      Mobility Bed Mobility Overal bed mobility: Needs Assistance Bed Mobility: Supine to Sit, Sit to Supine     Supine to sit: Min assist Sit to supine: Min assist        Transfers Overall transfer level: Needs assistance Equipment used: Rolling walker (2 wheels) Transfers: Sit to/from Stand Sit to Stand: Contact guard assist           General transfer comment: from bed and toilet      Balance Overall balance assessment: Needs assistance, History of Falls Sitting-balance support: Single extremity supported, Feet unsupported Sitting balance-Leahy Scale: Fair     Standing balance support: No upper extremity supported, During functional activity  Standing balance-Leahy Scale: Fair Standing balance comment: statically at the sink                           ADL either performed or assessed with clinical judgement   ADL Overall ADL's : Needs  assistance/impaired Eating/Feeding: Set up;Sitting   Grooming: Contact guard assist;Standing   Upper Body Bathing: Set up;Sitting   Lower Body Bathing: Minimal assistance;Sit to/from stand   Upper Body Dressing : Set up;Sitting   Lower Body Dressing: Minimal assistance;Sit to/from stand   Toilet Transfer: Contact guard assist;Ambulation;Rolling walker (2 wheels);Regular Toilet   Toileting- Clothing Manipulation and Hygiene: Contact guard assist;Sitting/lateral lean       Functional mobility during ADLs: Contact guard assist;Rolling walker (2 wheels) General ADL Comments: slowed and deliberate, no overt LOB, good management of RW     Vision Baseline Vision/History: 0 No visual deficits Vision Assessment?: No apparent visual deficits     Perception Perception: Within Functional Limits       Praxis Praxis: WFL       Pertinent Vitals/Pain Pain Assessment Pain Assessment: Faces Faces Pain Scale: Hurts a little bit Pain Location: every where Pain Descriptors / Indicators: Discomfort, Grimacing Pain Intervention(s): Limited activity within patient's tolerance, Monitored during session     Extremity/Trunk Assessment Upper Extremity Assessment Upper Extremity Assessment: LUE deficits/detail;RUE deficits/detail RUE Deficits / Details: WFL, generally weak, brusing throughout LUE Deficits / Details: sling removed, tolerating AROM. Using functionally for RW, toileting, grooming and bed mobility LUE Sensation: WNL   Lower Extremity Assessment Lower Extremity Assessment: Defer to PT evaluation   Cervical / Trunk Assessment Cervical / Trunk Assessment: Kyphotic   Communication Communication Communication: No apparent difficulties   Cognition Arousal: Lethargic, Alert Behavior During Therapy: WFL for tasks assessed/performed, Flat affect Overall Cognitive Status: History of cognitive impairments - at baseline                                 General  Comments: follows all commands, good insight to deficits. denies dementia dx     General Comments  VSS on RA, family present            Home Living Family/patient expects to be discharged to:: Assisted living                             Home Equipment: Agricultural Consultant (2 wheels);Wheelchair - manual   Additional Comments: [facility]      Prior Functioning/Environment Prior Level of Function : Needs assist       Physical Assist : ADLs (physical);Mobility (physical)   ADLs (physical): Dressing;Toileting;Bathing Mobility Comments: has halo rail on bed to get in/out, very mobile with RW to stand and amb in facility ADLs Comments: aids/staff assists with dressing in morning and evening. pt does toileting mod Ind, unless bad day with vertigo and can't do it herself.        OT Problem List: Decreased strength;Decreased range of motion;Decreased activity tolerance;Impaired balance (sitting and/or standing);Decreased safety awareness;Decreased knowledge of precautions;Decreased knowledge of use of DME or AE;Pain      OT Treatment/Interventions: Self-care/ADL training;Therapeutic exercise;DME and/or AE instruction;Therapeutic activities;Patient/family education;Balance training    OT Goals(Current goals can be found in the care plan section) Acute Rehab OT Goals Patient Stated Goal: home OT Goal Formulation: With patient Time For Goal Achievement: 08/29/23  Potential to Achieve Goals: Good ADL Goals Pt Will Perform Grooming: with modified independence Pt Will Perform Upper Body Dressing: with modified independence Pt Will Perform Lower Body Dressing: with modified independence Pt Will Transfer to Toilet: with modified independence  OT Frequency: Min 1X/week       AM-PAC OT 6 Clicks Daily Activity     Outcome Measure Help from another person eating meals?: A Little Help from another person taking care of personal grooming?: A Little Help from another  person toileting, which includes using toliet, bedpan, or urinal?: A Little Help from another person bathing (including washing, rinsing, drying)?: A Little Help from another person to put on and taking off regular upper body clothing?: A Little Help from another person to put on and taking off regular lower body clothing?: A Little 6 Click Score: 18   End of Session Equipment Utilized During Treatment: Gait belt;Rolling walker (2 wheels) Nurse Communication: Mobility status  Activity Tolerance: Patient tolerated treatment well Patient left: in bed;with call bell/phone within reach;with family/visitor present  OT Visit Diagnosis: Unsteadiness on feet (R26.81);Other abnormalities of gait and mobility (R26.89);Muscle weakness (generalized) (M62.81);Repeated falls (R29.6);History of falling (Z91.81);Pain                Time: 8780-8761 OT Time Calculation (min): 19 min Charges:  OT General Charges $OT Visit: 1 Visit OT Evaluation $OT Eval Moderate Complexity: 1 Mod  Lucie Kendall, OTR/L Acute Rehabilitation Services Office [phone] Secure Chat Communication Preferred   Lucie JONETTA Kendall 08/15/2023, 12:50 PM

## 2023-08-15 NOTE — TOC Initial Note (Addendum)
 Transition of Care [facility]) - Initial/Assessment Note    Patient Details  Name: Jocelyn [name] MRN: [id number] Date of Birth: [birth year]  [facility] of [facility]) CM/SW Contact:    Harlene Sharps, LCSW Phone Number: 08/15/2023, 11:01 AM  Clinical Narrative:                 CSW spoke with granddaughter Andriette who was at bedside with pt. Per [name], pt is from [facility] Care at [facility]. They have recently become unhappy with her care and would like for pt to received rehab at SNF prior to returning to a lower level of care. [name] has a preference for [facility] or [facility], agreeable to a fax out for more options. CSW discussed next steps with pt and [name] who noted they will be looking at possibly switching ALF's. Insurance criteria was also discussed. Medical team updated to plan. TOC will continue to follow for DC needs.    2:00 CSW notified pt would be OBS again. Pt has not met 3 midnight inpatient stay and does not have a [facility] waiver. CSW discussed with family and while disappointed they note that pt is much closer to baseline than yesterday. They are agreeable to pt returning to [facility]. CSW spoke with Fredick who noted that as pt had a severe decline yesterday, even with significant improvement, they will need to lay eyes on pt prior to her returning. They have scheduled a visit for 11 am on Monday. Medical team notified to delay in discharge.  Family also notified and agreeable. TOC will continue to follow.   3:00 Debra [phone]) with Fredick visited pt and noted she was attempting to get someone to do pt's in person assessment prior to Monday, unsure if this is a possibility. TOC will continue to follow and DC pt when able.  Expected Discharge Plan: Skilled Nursing Facility Barriers to Discharge: Continued Medical Work up, English As A Second Language Teacher, SNF Pending bed offer   Patient Goals and CMS Choice Patient states their goals for this hospitalization and ongoing recovery are:: Pt  and Granddaughter state they would like to look for another ALF after SNF. CMS Medicare.gov Compare Post Acute Care list provided to:: Patient Represenative (must comment) Choice offered to / list presented to : Adult Children Publishing Rights Manager)      Expected Discharge Plan and Services In-house Referral: Clinical Social Work   Post Acute Care Choice: Skilled Nursing Facility Living arrangements for the past 2 months: Assisted Living Facility                                      Prior Living Arrangements/Services Living arrangements for the past 2 months: Assisted Living Facility Lives with:: Facility Resident Patient language and need for interpreter reviewed:: Yes Do you feel safe going back to the place where you live?: Yes      Need for Family Participation in Patient Care: Yes (Comment) Care giver support system in place?: Yes (comment) Current home services: Home OT, Home PT, DME Criminal Activity/Legal Involvement Pertinent to Current Situation/Hospitalization: No - Comment as needed  Activities of Daily Living   ADL Screening (condition at time of admission) Independently performs ADLs?: No Does the patient have a NEW difficulty with bathing/dressing/toileting/self-feeding that is expected to last >3 days?: Yes (Initiates electronic notice to provider for possible OT consult) (left shoulder fracture) Does the patient have a NEW difficulty with getting in/out  of bed, walking, or climbing stairs that is expected to last >3 days?: Yes (Initiates electronic notice to provider for possible PT consult) (left shoulder fracture) Does the patient have a NEW difficulty with communication that is expected to last >3 days?: No Is the patient deaf or have difficulty hearing?: No Does the patient have difficulty seeing, even when wearing glasses/contacts?: Yes Does the patient have difficulty concentrating, remembering, or making decisions?: Yes  Permission  Sought/Granted Permission sought to share information with : Family Supports Permission granted to share information with : Yes, Verbal Permission Granted  Share Information with NAME: [name]     Permission granted to share info w Relationship: Grndtr     Emotional Assessment Appearance:: Appears stated age Attitude/Demeanor/Rapport: Inconsistent Affect (typically observed): Pleasant Orientation: : Oriented to Self, Oriented to Situation Alcohol / Substance Use: Not Applicable Psych Involvement: No (comment)  Admission diagnosis:  Anxiety [F41.9] Fall [W19.XXXA] Closed fracture of nasal bone, initial encounter [S02.2XXA] Closed fracture of glenoid cavity and neck of left scapula, initial encounter [S42.142A, S42.152A] Dementia with anxiety, unspecified dementia severity, unspecified dementia type (HCC) [F03.94] Patient Active Problem List   Diagnosis Date Noted   Glenoid fracture of shoulder, left, closed, initial encounter 08/15/2023   Nasal bone fracture 08/15/2023   Mass of submandibular region 08/15/2023   Dementia (HCC) 08/15/2023   CKD stage 3a, GFR 45-59 ml/min (HCC) 08/15/2023   Thoracic aortic aneurysm (HCC) 08/15/2023   History of ischemic right PCA stroke 08/15/2023   Fall 08/14/2023   Deficiency anemia 03/11/2022   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) 03/11/2022   Need for prophylactic vaccination and inoculation against varicella 03/11/2022   GAD (generalized anxiety disorder) 03/11/2022   Skin tear of right lower leg without complication 06/22/2020   Overactive bladder 02/23/2020   Cerebrovascular accident (CVA) due to stenosis of right posterior cerebral artery (HCC) 02/08/2020   Stage 3b chronic kidney disease (HCC) 01/26/2020   Current severe episode of major depressive disorder without psychotic features without prior episode (HCC) 01/25/2020   Vitamin B12 deficiency neuropathy (HCC) 01/25/2020   Primary osteoarthritis involving multiple  joints 12/08/2017   Thoracic aorta atherosclerosis (HCC) 12/12/2014   UTI (urinary tract infection) 09/10/2013   Depression 06/09/2013   Dyslipidemia, goal LDL below 130 09/20/2010   Essential hypertension 12/22/2008   PCP:  Oneita Charmaine Agent, NP Pharmacy:   [facility], [place] - [address] [place] [address] Phone: [phone] Fax: [phone]     Social Drivers of Health (SDOH) Social History: SDOH Screenings   Food Insecurity: Patient Unable To Answer (08/14/2023)  Housing: Patient Unable To Answer (08/14/2023)  Transportation Needs: Patient Unable To Answer (08/14/2023)  Utilities: Patient Unable To Answer (08/14/2023)  Alcohol Screen: Low Risk  (06/20/2022)  Depression (PHQ2-9): Low Risk  (06/20/2022)  Financial Resource Strain: Low Risk  (06/14/2021)  Physical Activity: Inactive (06/20/2022)  Social Connections: Unknown (08/14/2023)  Stress: No Stress Concern Present (06/20/2022)  Tobacco Use: Medium Risk (08/14/2023)   SDOH Interventions:     Readmission Risk Interventions     No data to display

## 2023-08-15 NOTE — Plan of Care (Signed)
  Problem: Education: Goal: Knowledge of General Education information will improve Description: Including pain rating scale, medication(s)/side effects and non-pharmacologic comfort measures Outcome: Progressing   Problem: Activity: Goal: Risk for activity intolerance will decrease Outcome: Progressing   Problem: Nutrition: Goal: Adequate nutrition will be maintained Outcome: Progressing   Problem: Elimination: Goal: Will not experience complications related to bowel motility Outcome: Progressing   Problem: Pain Management: Goal: General experience of comfort will improve Outcome: Progressing   Problem: Safety: Goal: Ability to remain free from injury will improve Outcome: Progressing   Problem: Skin Integrity: Goal: Risk for impaired skin integrity will decrease Outcome: Progressing

## 2023-08-15 NOTE — Hospital Course (Addendum)
 Active Problems:   Depression   Overactive bladder   Glenoid fracture of shoulder, left, closed, initial encounter   Nasal bone fracture   Mass of submandibular region   Dementia (HCC)   CKD stage 3a, GFR 45-59 ml/min (HCC)   Thoracic aortic aneurysm (HCC)   History of ischemic right PCA stroke  Principal Problem (Resolved):   Fall Resolved Problems:   UTI (urinary tract infection)  Consults:***  Procedures:***  Follow-up items: -recommend outpatient MRI neck with and without contrast for further evaluation  Fall Left glenoid fracture Nasal bone fracture Patient fell and sustained a L glenoid fracture and nasal bone fx. CTH negative for bleed and MRI ruled out stroke. Patient's pain was controlled throughout hospitalization with scheduled tylenol . She will be discharged to ALF with HHOT and HHPT.   UTI Diagnosed at [facility] and was treated with Macrobid  for 5 days.  Chronic right submandibular mass Seen on CT imaging dating back to 2021. MRI neck w/ and without contrast recommended for further evaluation.  Dementia Depression Continued home memantine  5 mg bid, duloxetine  40 mg daily.   Overactive bladder On Solifenacin  10 mg daily at home and replaced with fesoteridine 4 mg daily here.    CKD3a Last GFR in October was 40, stable Scr at 1.20 and eGFR 43

## 2023-08-15 NOTE — NC FL2 (Addendum)
 [place]  MEDICAID FL2 LEVEL OF CARE FORM     IDENTIFICATION  Patient Name: Jocelyn [name] Birthdate: [birth year] Sex: female Admission Date (Current Location): 08/14/2023  [facility] and [place] Number:  Producer, Television/film/video and Address:  [facility] [place], [address], [place], [place] [address]      Provider Number: [id number]  Attending Physician Name and Address:  Rosan Dayton BROCKS, DO  Relative Name and Phone Number:       Current Level of Care: Hospital Recommended Level of Care: Skilled Nursing Facility Prior Approval Number:    Date Approved/Denied:   PASRR Number:   [id number] Discharge Plan: SNF    Current Diagnoses: Patient Active Problem List   Diagnosis Date Noted   Glenoid fracture of shoulder, left, closed, initial encounter 08/15/2023   Nasal bone fracture 08/15/2023   Mass of submandibular region 08/15/2023   Dementia (HCC) 08/15/2023   CKD stage 3a, GFR 45-59 ml/min (HCC) 08/15/2023   Thoracic aortic aneurysm (HCC) 08/15/2023   History of ischemic right PCA stroke 08/15/2023   Fall 08/14/2023   Deficiency anemia 03/11/2022   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) 03/11/2022   Need for prophylactic vaccination and inoculation against varicella 03/11/2022   GAD (generalized anxiety disorder) 03/11/2022   Skin tear of right lower leg without complication 06/22/2020   Overactive bladder 02/23/2020   Cerebrovascular accident (CVA) due to stenosis of right posterior cerebral artery (HCC) 02/08/2020   Stage 3b chronic kidney disease (HCC) 01/26/2020   Current severe episode of major depressive disorder without psychotic features without prior episode (HCC) 01/25/2020   Vitamin B12 deficiency neuropathy (HCC) 01/25/2020   Primary osteoarthritis involving multiple joints 12/08/2017   Thoracic aorta atherosclerosis (HCC) 12/12/2014   UTI (urinary tract infection) 09/10/2013   Depression 06/09/2013   Dyslipidemia, goal LDL  below 130 09/20/2010   Essential hypertension 12/22/2008    Orientation RESPIRATION BLADDER Height & Weight     Self, Place  Normal Incontinent Weight:   Height:     BEHAVIORAL SYMPTOMS/MOOD NEUROLOGICAL BOWEL NUTRITION STATUS      Continent Diet (See DC summary)  AMBULATORY STATUS COMMUNICATION OF NEEDS Skin   Extensive Assist Verbally Skin abrasions (Bilateral nose, arm, and leg Ecchymosis)                       Personal Care Assistance Level of Assistance  Bathing, Feeding, Dressing Bathing Assistance: Maximum assistance Feeding assistance: Limited assistance Dressing Assistance: Maximum assistance     Functional Limitations Info  Sight, Hearing, Speech Sight Info: Adequate Hearing Info: Adequate Speech Info: Adequate    SPECIAL CARE FACTORS FREQUENCY  PT (By licensed PT), OT (By licensed OT)     PT Frequency: 5x week OT Frequency: 5x week            Contractures Contractures Info: Not present    Additional Factors Info  Code Status, Allergies, Psychotropic Code Status Info: DNR Allergies Info: Codeine  Penicillins  Tape Psychotropic Info: Duloxetine          Current Medications (08/15/2023):  This is the current hospital active medication list Current Facility-Administered Medications  Medication Dose Route Frequency Provider Last Rate Last Admin   acetaminophen  (TYLENOL ) tablet 1,000 mg  1,000 mg Oral Q6H Bender, Emily, DO   1,000 mg at 08/15/23 1015   bisacodyl  (DULCOLAX) suppository 10 mg  10 mg Rectal PRN Randol Simmonds, MD       DULoxetine  (CYMBALTA ) DR  capsule 40 mg  40 mg Oral Daily Randol Simmonds, MD   40 mg at 08/15/23 1016   fesoterodine  (TOVIAZ ) tablet 4 mg  4 mg Oral Daily Randol Simmonds, MD   4 mg at 08/15/23 1016   guaiFENesin  (MUCINEX ) 12 hr tablet 600 mg  600 mg Oral BID Randol Simmonds, MD   600 mg at 08/15/23 1015   hydrOXYzine  (ATARAX ) tablet 10 mg  10 mg Oral TID PRN Kandis Perkins, DO       lidocaine  (LIDODERM ) 5 % 1 patch  1 patch Transdermal Q24H  Kandis Perkins, DO   1 patch at 08/15/23 1016   magnesium  hydroxide (MILK OF MAGNESIA) suspension 30 mL  30 mL Oral Daily PRN Randol Simmonds, MD       memantine  (NAMENDA ) tablet 5 mg  5 mg Oral BID Randol Simmonds, MD   5 mg at 08/15/23 1016   nitrofurantoin  (macrocrystal-monohydrate) (MACROBID ) capsule 100 mg  100 mg Oral BID Randol Simmonds, MD   100 mg at 08/15/23 1016   oxyCODONE  (Oxy IR/ROXICODONE ) immediate release tablet 5 mg  5 mg Oral Q6H PRN Atway, Rayann N, DO   5 mg at 08/14/23 9040   senna-docusate (Senokot-S) tablet 1 tablet  1 tablet Oral Daily Randol Simmonds, MD   1 tablet at 08/15/23 1015     Discharge Medications: Please see discharge summary for a list of discharge medications.  Relevant Imaging Results:  Relevant Lab Results:   Additional Information SS# [id number], [place]

## 2023-08-15 NOTE — Progress Notes (Signed)
 Physical Therapy Treatment  Patient Details Name: Jocelyn [name] MRN: [id number] DOB: 08/08/1932 Today's Date: 08/15/2023   History of Present Illness Patient is 89 y.o. female from [facility] where she fell earlier today and brought to the ED for Lt shoulder, knee, foot pain and head trauma w/ nose pain. Spinal imaging indicates no acute injuries of cervical, thoracic, or lumbar spine w/ chronic T8 compression fracture. Imaging of Lt UE/LE reveal Lt closed fracture of glenoid cavity and neck of left scapula. Imaging of Lt foot shows vague cortical irregularity at base of the first proximal phalanx but not fx confirmed. Pt also found to have nasal bone fx. While in the ED, pt suddenly became confused w/ difficulty naming objects/family. Code Stroke activated and Brain MRI negative for acute abnormality. PMH significant for dementia with sundowning/confusion/wandering (resides in SNF), HTN, HLD, stage IIIb CKD, mood disorder, generalized anxiety disorder, frequent falls.    PT Comments  Pt progressing towards physical therapy goals. Pt complains of minimal pain in L elbow however she does not appear limited by it throughout functional mobility. Pt ambulated ~250' with the RW for support and progressed to supervision status for safety by end of session. Pt reports she is not walking as fast as she usually does, however pt did not appear unsteady and was without overt LOB throughout gait training. Anticipate pt is close to baseline of function. Recommend return to ALF with HHPT to follow up. Will continue to follow.     If plan is discharge home, recommend the following: Assist for transportation;Help with stairs or ramp for entrance;Supervision due to cognitive status;A little help with walking and/or transfers;A little help with bathing/dressing/bathroom;Assistance with cooking/housework;Direct supervision/assist for medications management   Can travel by private vehicle     No  Equipment  Recommendations   (Defer to next venue of care, may benefit from a shower chair.)    Recommendations for Other Services OT consult     Precautions / Restrictions Precautions Precautions: Fall Precaution Comments: last 6 months at least 8-9 falls; falls hapen when not using RW. Required Braces or Orthoses: Sling (for comfort per [place], MD via secure chat 1/3) Restrictions Weight Bearing Restrictions Per Provider Order: Yes LUE Weight Bearing Per Provider Order: Weight bearing as tolerated (for comfort per [place], MD via secure chat 1/3 - confirmed with ortho)     Mobility  Bed Mobility Overal bed mobility: Needs Assistance Bed Mobility: Supine to Sit       Sit to supine: Supervision, HOB elevated   General bed mobility comments: Pt was able to transition to EOB without assistance. HOB elevated, min use of rails.    Transfers Overall transfer level: Needs assistance Equipment used: Rolling walker (2 wheels) Transfers: Sit to/from Stand Sit to Stand: Contact guard assist           General transfer comment: Close guard for safety as pt powered up to full stand. VC's for hand placement on seated surface for safety but no assist required for power up to full stand.    Ambulation/Gait Ambulation/Gait assistance: Contact guard assist, Supervision Gait Distance (Feet): 250 Feet Assistive device: Rolling walker (2 wheels) Gait Pattern/deviations: Step-through pattern, Decreased stride length, Trunk flexed Gait velocity: Decreased Gait velocity interpretation: 1.31 - 2.62 ft/sec, indicative of limited community ambulator   General Gait Details: VC's for closer walker proximity throughout gait training. Pt without any overt LOB. Overall moving slow but generally steady with RW for support. Chair follow for safety but not  utilized.   Stairs             Wheelchair Mobility     Tilt Bed    Modified Rankin (Stroke Patients Only)       Balance Overall  balance assessment: Needs assistance, History of Falls Sitting-balance support: Single extremity supported, Feet unsupported Sitting balance-Leahy Scale: Fair Sitting balance - Comments: CGA for safety   Standing balance support: No upper extremity supported, During functional activity Standing balance-Leahy Scale: Fair                              Cognition Arousal: Alert Behavior During Therapy: WFL for tasks assessed/performed Overall Cognitive Status: History of cognitive impairments - at baseline                                 General Comments: follows all commands, good insight to deficits. Very observant.        Exercises      General Comments General comments (skin integrity, edema, etc.): VSS on RA, family present      Pertinent Vitals/Pain Pain Assessment Pain Assessment: Faces Faces Pain Scale: Hurts a little bit Pain Location: L elbow Pain Descriptors / Indicators: Discomfort, Grimacing Pain Intervention(s): Limited activity within patient's tolerance, Monitored during session, Repositioned    Home Living Family/patient expects to be discharged to:: Assisted living                 Home Equipment: Agricultural Consultant (2 wheels);Wheelchair - manual Additional Comments: [facility]    Prior Function            PT Goals (current goals can now be found in the care plan section) Acute Rehab PT Goals Patient Stated Goal: recover from fall and get mobile PT Goal Formulation: With patient Time For Goal Achievement: 08/28/23 Potential to Achieve Goals: Good Progress towards PT goals: Progressing toward goals    Frequency    Min 1X/week      PT Plan      Co-evaluation              AM-PAC PT 6 Clicks Mobility   Outcome Measure  Help needed turning from your back to your side while in a flat bed without using bedrails?: A Little Help needed moving from lying on your back to sitting on the side of a flat  bed without using bedrails?: A Little Help needed moving to and from a bed to a chair (including a wheelchair)?: A Little Help needed standing up from a chair using your arms (e.g., wheelchair or bedside chair)?: A Little Help needed to walk in hospital room?: A Little Help needed climbing 3-5 steps with a railing? : A Little 6 Click Score: 18    End of Session Equipment Utilized During Treatment: Gait belt Activity Tolerance: Patient tolerated treatment well Patient left: in chair;with call bell/phone within reach;with chair alarm set Nurse Communication: Mobility status PT Visit Diagnosis: Other abnormalities of gait and mobility (R26.89);Muscle weakness (generalized) (M62.81);History of falling (Z91.81);Difficulty in walking, not elsewhere classified (R26.2);Pain Pain - Right/Left: Left Pain - part of body: Hip;Knee;Shoulder     Time: 8582-8554 PT Time Calculation (min) (ACUTE ONLY): 28 min  Charges:    $Gait Training: 23-37 mins PT General Charges $$ ACUTE PT VISIT: 1 Visit  Leita Sable, PT, DPT Acute Rehabilitation Services Secure Chat Preferred Office: [phone]    Leita JONETTA Sable 08/15/2023, 2:55 PM

## 2023-08-15 NOTE — Care Management Obs Status (Signed)
 MEDICARE OBSERVATION STATUS NOTIFICATION   Patient Details  Name: Jocelyn [name] MRN: [id number] Date of Birth: [birth year]   Medicare Observation Status Notification Given:  Yes  Explained form to daughter Madelin Arias at bedside , and grand daughter Andriette Pouch via phone.   Daughter Madelin Arias signed form   Stephane Powell Jansky, RN 08/15/2023, 1:16 PM

## 2023-08-16 DIAGNOSIS — S42142D Displaced fracture of glenoid cavity of scapula, left shoulder, subsequent encounter for fracture with routine healing: Secondary | ICD-10-CM

## 2023-08-16 DIAGNOSIS — W19XXXD Unspecified fall, subsequent encounter: Secondary | ICD-10-CM

## 2023-08-16 DIAGNOSIS — S42152D Displaced fracture of neck of scapula, left shoulder, subsequent encounter for fracture with routine healing: Secondary | ICD-10-CM | POA: Diagnosis not present

## 2023-08-16 DIAGNOSIS — R296 Repeated falls: Secondary | ICD-10-CM | POA: Diagnosis not present

## 2023-08-16 DIAGNOSIS — S022XXD Fracture of nasal bones, subsequent encounter for fracture with routine healing: Secondary | ICD-10-CM

## 2023-08-16 DIAGNOSIS — F0394 Unspecified dementia, unspecified severity, with anxiety: Secondary | ICD-10-CM | POA: Diagnosis not present

## 2023-08-16 MED ORDER — SENNOSIDES-DOCUSATE SODIUM 8.6-50 MG PO TABS
2.0000 | ORAL_TABLET | Freq: Every day | ORAL | Status: DC
  Administered 2023-08-16: 2 via ORAL
  Filled 2023-08-16: qty 2

## 2023-08-16 NOTE — Progress Notes (Signed)
 DON of [facility] visited the patient last night and performed an assessment for the patient to be readmitted to the facility.

## 2023-08-16 NOTE — Plan of Care (Signed)

## 2023-08-16 NOTE — Progress Notes (Signed)
 Transition of Care [facility]) - CAGE-AID Screening   Patient Details  Name: Jocelyn [name] MRN: [id number] Date of Birth: [birth year]   Darice CHRISTELLA Rouleau, RN Trauma Response Nurse Phone Number: [phone] 08/16/2023, 3:52 PM      CAGE-AID Screening:    Have You Ever Felt You Ought to Cut Down on Your Drinking or Drug Use?: No Have People Annoyed You By Critizing Your Drinking Or Drug Use?: No Have You Felt Bad Or Guilty About Your Drinking Or Drug Use?: No Have You Ever Had a Drink or Used Drugs First Thing In The Morning to Steady Your Nerves or to Get Rid of a Hangover?: No CAGE-AID Score: 0  Substance Abuse Education Offered: (S) No (No services needed - pt lives at [facility], Hx of dementia- no alcohol / drug use)

## 2023-08-16 NOTE — Progress Notes (Signed)
   Subjective:  Jocelyn [name] is a 89 y.o. person living with a history of dementia, overactive bladder, and vit D deficiency who presented with a fall and admitted for fall.   Today, she reported that her pain is not bad this morning.  She denied any other acute complaints at this time.  I did speak with her granddaughter on phone and gave her an update with placement.  The granddaughter and patient's daughter are both okay with her returning to the [place] assisted living facility on Monday.  She was updated that will have home health OT and PT.   Objective:  Vital signs in last 24 hours: Vitals:   08/15/23 0825 08/15/23 1703 08/15/23 2043 08/16/23 0502  BP: 129/79 124/82 119/81 (!) 155/87  Pulse: 84 97 (!) 102 64  Resp: 17 17    Temp: (!) 97.5 F (36.4 C) 98.1 F (36.7 C) 98.1 F (36.7 C) 98 F (36.7 C)  TempSrc:   Oral   SpO2:  98% 95% 98%   Physical Exam: General: Elderly appearing female resting in bed HENT: Intraorbital ecchymosis present bilaterally Cardiac: Regular rate and rhythm, no murmurs present Pulmonary: Normal effort on room air Abdominal: Soft, no tenderness to palpation Neuro: Alert and oriented to self, place, time, situation MSK: Bilateral radial pulses are present and symmetrical  Skin: Warm and dry, numerous ecchymoses present Psych: Normal mood and affect  Assessment/Plan:  Principal Problem:   Fall Active Problems:   Depression   UTI (urinary tract infection)   Overactive bladder   Glenoid fracture of shoulder, left, closed, initial encounter   Nasal bone fracture   Mass of submandibular region   Dementia (HCC)   CKD stage 3a, GFR 45-59 ml/min (HCC)   Thoracic aortic aneurysm (HCC)   History of ischemic right PCA stroke  Fall Fractured L glenoid Fractured nasal bone  Patient fell and sustained a L glenoid fracture and nasal bone fx. I spoke with the granddaughter on the phone who is comfortable with moving forward with the patient returning  to her assisted living facility with home health occupational therapy and home health physical therapy.  Patient appears well this morning, reports minimal pain. Plan: -Continue scheduled Tylenol  1000 mg every 6 hours -Continue oxycodone  5 mg every 6 as needed for severe pain -Patient will be admitted until an RN from her assisted living facility is able to evaluate her on Monday -Continue PT OT  Chronic right submandibular mass Seen on CT imaging dating back to 2021. MRI neck w/ and without contrast recommended for further evaluation.    Dementia Depression Continued home memantine  5 mg bid, duloxetine  40 mg daily.   Overactive bladder On Solifenacin  10 mg daily at home and replaced with fesoteridine 4 mg daily here.   CKD3a Stable, will continue with lab holiday    History of ischemic R PCA stroke Continue Plavix    Resolved Problems:  UTI s/p 5 day course of Macrobid  __________________________________ Prior to Admission Living Arrangement:ALF Anticipated Discharge Location:ALF with HHOT/PT Barriers to Discharge:Placement  Dispo: Anticipated discharge in approximately 2 day(s).   Kandis Perkins, DO 08/16/2023, 10:32 AM Pager: [phone] After 5pm on weekdays and 1pm on weekends: On Call pager [phone]

## 2023-08-17 DIAGNOSIS — R296 Repeated falls: Secondary | ICD-10-CM | POA: Diagnosis not present

## 2023-08-17 MED ORDER — LIDOCAINE 5 % EX PTCH: 1.0000 | MEDICATED_PATCH | CUTANEOUS | 0 refills | Status: AC

## 2023-08-17 NOTE — Discharge Summary (Signed)
 Name: Jocelyn [name] MRN: [id number] DOB: 08/24/1931 89 y.o. PCP: Oneita Charmaine Agent, NP  Date of Admission: 08/14/2023 12:20 AM Date of Discharge: 08/17/2023 1:51 PM Attending Physician: Lovie Clarity, MD  Discharge diagnosis: Active Problems:   Depression   Overactive bladder   Glenoid fracture of shoulder, left, closed, initial encounter   Nasal bone fracture   Mass of submandibular region   Dementia (HCC)   CKD stage 3a, GFR 45-59 ml/min [facility])   Thoracic aortic aneurysm (HCC)   History of ischemic right PCA stroke  Principal Problem (Resolved):   Fall Resolved Problems:   UTI (urinary tract infection)   Discharge medications: Allergies as of 08/17/2023       Reactions   Codeine Itching, Nausea And Vomiting   Penicillins Rash   Tape Rash   Tears the skin        Medication List     STOP taking these medications    meclizine 25 MG tablet Commonly known as: ANTIVERT   nitrofurantoin  (macrocrystal-monohydrate) 100 MG capsule Commonly known as: MACROBID        TAKE these medications    acetaminophen  500 MG tablet Commonly known as: TYLENOL  Take 1,000 mg by mouth 2 (two) times daily as needed for fever or mild pain (pain score 1-3).   bisacodyl  10 MG suppository Commonly known as: DULCOLAX Place 10 mg rectally as needed for moderate constipation.   CeraVe Moisturizing Crea Apply 1 application  topically every 12 (twelve) hours as needed (for dryness).   cholecalciferol 25 MCG (1000 UNIT) tablet Commonly known as: VITAMIN D3 Take 1,000 Units by mouth daily.   clopidogrel  75 MG tablet Commonly known as: PLAVIX  TAKE ONE TABLET BY MOUTH ONE TIME DAILY   Cranberry 500 MG Tabs Take 500 mg by mouth daily.   DULoxetine  20 MG capsule Commonly known as: CYMBALTA  Take 40 mg by mouth daily.   guaiFENesin  600 MG 12 hr tablet Commonly known as: MUCINEX  Take 600 mg by mouth 2 (two) times daily.   lidocaine  5 % Commonly known as: LIDODERM  Place 1  patch onto the skin daily. Remove & Discard patch within 12 hours or as directed by MD Start taking on: August 18, 2023   magnesium  hydroxide 400 MG/5ML suspension Commonly known as: MILK OF MAGNESIA Take 30 mLs by mouth daily as needed for mild constipation.   memantine  10 MG tablet Commonly known as: NAMENDA  TAKE ONE TABLET BY MOUTH TWICE DAILY What changed: how much to take   sennosides-docusate sodium  8.6-50 MG tablet Commonly known as: SENOKOT-S Take 1 tablet by mouth daily.   solifenacin  10 MG tablet Commonly known as: VESICARE  TAKE ONE TABLET BY MOUTH ONE TIME DAILY        Follow-up appointments:  Follow-up Information     Oneita Charmaine Agent, NP Follow up.   Specialty: Nurse Practitioner Contact information: [address] [place] [address] [phone]                 Disposition and recommendations: [name] is a 89 y.o. year old admitted after mechanical fall on blood thinners.  Discharged on hospital day 3 in good condition back to her home assisted living facility.  Fall No intracranial hemorrhage.  She suffered a small left glenoid fracture and nasal bone fracture, neither requiring fixation.  Weightbearing as tolerated is okay for the left arm.  Tylenol  for pain control.  Home health PT/OT/RN.  Chronic right submandibular mass Dating back to 2021, redemonstrated  during this admission.  MRI neck for better characterization if in line with goals of care.  Dementia Depression Continued outpatient memantine  and duloxetine .  History of right PCA cerebral infarction Plavix  was restarted prior to discharge.  Hospital course:  Fall Left glenoid fracture Nasal bone fracture Patient fell and sustained a L glenoid fracture and nasal bone fx. CTH negative for bleed and MRI ruled out stroke. Patient's pain was controlled throughout hospitalization with scheduled tylenol . She will be discharged to ALF with HHOT and HHPT.    UTI Diagnosed at [facility] and was treated with Macrobid  for 5 days.  Chronic right submandibular mass Seen on CT imaging dating back to 2021. MRI neck w/ and without contrast recommended for further evaluation.  Dementia Depression Continued home memantine  5 mg bid, duloxetine  40 mg daily.   Overactive bladder On Solifenacin  10 mg daily at home and replaced with fesoteridine 4 mg daily here.    CKD3a Last GFR in October was 40, stable Scr at 1.20 and eGFR 43  Discharge exam: Feels much better than she did on admission.  Eager to leave the hospital.  Shoulder still painful but she is able to raise it overhead.   Blood pressure 124/70, pulse 88, temperature 99 F (37.2 C), temperature source Oral, resp. rate 17, SpO2 93%.  No distress, frail-appearing Bruising around the nose and under her eyes Heart rate and rhythm are normal, radial pulses strong Breathing comfortably on room air Skin is warm and dry Decent range of motion with left shoulder, able to raise her arm over her head Alert and oriented to person and place, speech is normal sounding, no facial asymmetry  Pertinent studies and procedures: Recent Results (from the past 56199 hours)  ECHOCARDIOGRAM COMPLETE   Collection Time: 02/01/20  1:43 PM  Result Value   BP 151/74   Narrative      ECHOCARDIOGRAM REPORT       Patient Name:   Jocelyn [name] Date of Exam: 02/01/2020 Medical Rec #:  [id number]   Height:       66.0 in Accession #:    [id number]  Weight:       156.4 lb Date of Birth:  November 20, 1931  BSA:          1.801 m Patient Age:    89 years    BP:           145/78 mmHg Patient Gender: F           HR:           65 bpm. Exam Location:  Inpatient  Procedure: 2D Echo  Indications:    Stroke 434.91 / I163.9   History:        Patient has no prior history of Echocardiogram examinations.                 Risk Factors:Hypertension and Dyslipidemia.   Sonographer:    Annabella Fell RDCS Referring Phys: [id number]  DELAYNE LULLA SOLIAN  IMPRESSIONS    1. Left ventricular ejection fraction, by estimation, is 65 to 70%. The left ventricle has normal function. The left ventricle has no regional wall motion abnormalities. Left ventricular diastolic parameters are consistent with Grade I diastolic  dysfunction (impaired relaxation). Elevated left ventricular end-diastolic pressure.  2. Right ventricular systolic function is normal. The right ventricular size is normal. Tricuspid regurgitation signal is inadequate for assessing PA pressure.  3. The mitral valve is normal in structure. Trivial mitral valve regurgitation. No evidence of mitral  stenosis.  4. The aortic valve was not well visualized. Aortic valve regurgitation is mild.  5. The inferior vena cava is normal in size with greater than 50% respiratory variability, suggesting right atrial pressure of 3 mmHg.  FINDINGS  Left Ventricle: Left ventricular ejection fraction, by estimation, is 65 to 70%. The left ventricle has normal function. The left ventricle has no regional wall motion abnormalities. The left ventricular internal cavity size was normal in size. There is  no left ventricular hypertrophy. Left ventricular diastolic parameters are consistent with Grade I diastolic dysfunction (impaired relaxation). Elevated left ventricular end-diastolic pressure.  Right Ventricle: The right ventricular size is normal. No increase in right ventricular wall thickness. Right ventricular systolic function is normal. Tricuspid regurgitation signal is inadequate for assessing PA pressure.  Left Atrium: Left atrial size was normal in size.  Right Atrium: Right atrial size was normal in size.  Pericardium: There is no evidence of pericardial effusion.  Mitral Valve: The mitral valve is normal in structure. Normal mobility of the mitral valve leaflets. Trivial mitral valve regurgitation. No evidence of mitral valve stenosis.  Tricuspid Valve: The tricuspid valve is  normal in structure. Tricuspid valve regurgitation is trivial. No evidence of tricuspid stenosis.  Aortic Valve: The aortic valve was not well visualized. Aortic valve regurgitation is mild.  Pulmonic Valve: The pulmonic valve was normal in structure. Pulmonic valve regurgitation is not visualized. No evidence of pulmonic stenosis.  Aorta: The aortic root was not well visualized and the ascending aorta was not well visualized.  Venous: The inferior vena cava is normal in size with greater than 50% respiratory variability, suggesting right atrial pressure of 3 mmHg.  IAS/Shunts: No atrial level shunt detected by color flow Doppler.    LEFT VENTRICLE PLAX 2D LVIDd:         3.80 cm  Diastology LVIDs:         2.30 cm  LV e' lateral:   5.77 cm/s LV PW:         1.00 cm  LV E/e' lateral: 13.8 LV IVS:        1.10 cm  LV e' medial:    6.31 cm/s LVOT diam:     2.10 cm  LV E/e' medial:  12.6 LV SV:         52 LV SV Index:   29 LVOT Area:     3.46 cm    RIGHT VENTRICLE RV S prime:     16.10 cm/s TAPSE (M-mode): 1.8 cm  LEFT ATRIUM             Index       RIGHT ATRIUM          Index LA diam:        3.10 cm 1.72 cm/m  RA Area:     7.13 cm LA Vol (A2C):   31.7 ml 17.60 ml/m RA Volume:   9.63 ml  5.35 ml/m LA Vol (A4C):   32.7 ml 18.15 ml/m LA Biplane Vol: 33.5 ml 18.60 ml/m  AORTIC VALVE LVOT Vmax:   69.00 cm/s LVOT Vmean:  46.500 cm/s LVOT VTI:    0.151 m   AORTA Ao Root diam: 3.20 cm  MITRAL VALVE MV Area (PHT): 3.06 cm     SHUNTS MV Decel Time: 248 msec     Systemic VTI:  0.15 m MV E velocity: 79.60 cm/s   Systemic Diam: 2.10 cm MV A velocity: 120.00 cm/s MV E/A ratio:  0.66  Traci  Turner MD Electronically signed by Wilbert Bihari MD Signature Date/Time: 02/01/2020/1:50:56 PM       Final     *Note: Due to a large number of results and/or encounters for the requested time period, some results have not been displayed. A complete set of results can be found in Results  Review.   Imaging Orders         DG Chest Portable 1 View         DG Pelvis Portable         CT MAXILLOFACIAL WO CONTRAST         CT CERVICAL SPINE WO CONTRAST         CT HEAD WO CONTRAST ( )         DG Shoulder Left         DG Knee Complete 4 Views Left         DG Foot Complete Left         CT CHEST ABDOMEN PELVIS W CONTRAST         CT ANGIO HEAD NECK W WO CM (CODE STROKE)         MR BRAIN WO CONTRAST         UE VENOUS DUPLEX (7am - 7pm)    Lab Orders         CBC         Comprehensive metabolic panel         Protime-INR         Urinalysis, w/ Reflex to Culture (Infection Suspected) -Urine, Clean Catch         Basic metabolic panel         CBC         CBG monitoring, ED     Discharge Instructions:   Discharge Instructions      To [name] or their caretakers,  They were admitted to [facility] on 08/14/2023 for evaluation and treatment of:  Active Problems:   Depression   Overactive bladder   Glenoid fracture of shoulder, left, closed, initial encounter   Nasal bone fracture   Mass of submandibular region   Dementia (HCC)   CKD stage 3a, GFR 45-59 ml/min (HCC)   Thoracic aortic aneurysm (HCC)   History of ischemic right PCA stroke  Principal Problem (Resolved):   Fall Resolved Problems:   UTI (urinary tract infection)  The evaluation suggested some minor fractures in your face and left shoulder. They were treated with physical therapy and supportive care.  They were discharged from the hospital on 08/17/23. I recommend the following after leaving the hospital:   You do not need to take any more antibiotics, you completed treatment for the UTI that you had before you came to the hospital.  I recommend stopping meclizine as this can actually cause falls.  You can take Tylenol  for pain.  Referrals for home health physical therapy, Occupational Therapy, and nursing have been made to help you transition back to home safely.  For  questions about your care plan, until you are able to see your primary doctor: Call [phone]. Dial 0 for the operator. Ask for the internal medicine resident on call.  Ozell Kung MD 08/17/2023, 1:47 PM        Ozell Kung MD 08/17/2023, 1:51 PM

## 2023-08-17 NOTE — Discharge Instructions (Addendum)
 To Ms. Rashonda Warrior or their caretakers,  They were admitted to [facility] on 08/14/2023 for evaluation and treatment of:  Active Problems:   Depression   Overactive bladder   Glenoid fracture of shoulder, left, closed, initial encounter   Nasal bone fracture   Mass of submandibular region   Dementia (HCC)   CKD stage 3a, GFR 45-59 ml/min (HCC)   Thoracic aortic aneurysm (HCC)   History of ischemic right PCA stroke  Principal Problem (Resolved):   Fall Resolved Problems:   UTI (urinary tract infection)  The evaluation suggested some minor fractures in your face and left shoulder. They were treated with physical therapy and supportive care.  They were discharged from the hospital on 08/17/23. I recommend the following after leaving the hospital:   You do not need to take any more antibiotics, you completed treatment for the UTI that you had before you came to the hospital.  I recommend stopping meclizine as this can actually cause falls.  You can take Tylenol  for pain.  Referrals for home health physical therapy, Occupational Therapy, and nursing have been made to help you transition back to home safely.  For questions about your care plan, until you are able to see your primary doctor: Call [phone]. Dial 0 for the operator. Ask for the internal medicine resident on call.  Ozell Kung MD 08/17/2023, 1:47 PM

## 2023-08-17 NOTE — TOC Progression Note (Addendum)
 Transition of Care [facility]) - Progression Note    Patient Details  Name: Jocelyn [name] MRN: [id number] Date of Birth: October 03, 1931  Transition of Care [facility]) CM/SW Contact  Jocelyn [name], LCSWA Phone Number: 08/17/2023, 1:29 PM  Clinical Narrative:     SW spoke with Adrien [facility] HP [phone]) will attempt to contact DON to get update on if pt can return.  Update 146pm SW spoke with Victoria (DON [phone]) confirmed pt can return to facility today. Victoria states RN can give her report.   SW spoke with pt's granddaughter Andriette [phone]) informed of d/c.  PTAR called  Expected Discharge Plan: Skilled Nursing Facility Barriers to Discharge: Continued Medical Work up, English As A Second Language Teacher, SNF Pending bed offer  Expected Discharge Plan and Services In-house Referral: Clinical Social Work   Post Acute Care Choice: Skilled Nursing Facility Living arrangements for the past 2 months: Assisted Living Facility                                       Social Determinants of Health (SDOH) Interventions SDOH Screenings   Food Insecurity: Patient Unable To Answer (08/14/2023)  Housing: Patient Unable To Answer (08/14/2023)  Transportation Needs: Patient Unable To Answer (08/14/2023)  Utilities: Patient Unable To Answer (08/14/2023)  Alcohol Screen: Low Risk  (06/20/2022)  Depression (PHQ2-9): Low Risk  (06/20/2022)  Financial Resource Strain: Low Risk  (06/14/2021)  Physical Activity: Inactive (06/20/2022)  Social Connections: Unknown (08/14/2023)  Stress: No Stress Concern Present (06/20/2022)  Tobacco Use: Medium Risk (08/14/2023)    Readmission Risk Interventions     No data to display

## 2023-08-17 NOTE — Plan of Care (Signed)

## 2023-08-17 NOTE — TOC Transition Note (Signed)
 Transition of Care [facility]) - Discharge Note   Patient Details  Name: Jocelyn [name] MRN: [id number] Date of Birth: [birth year]  Transition of Care [facility]) CM/SW Contact:  Dino CHRISTELLA Au, LCSWA Phone Number: 08/17/2023, 3:06 PM   Clinical Narrative:     Pt d/c back to [facility] via [facility].  Final next level of care: Assisted Living Barriers to Discharge: Barriers Resolved   Patient Goals and CMS Choice Patient states their goals for this hospitalization and ongoing recovery are:: Pt and Granddaughter state they would like to look for another ALF after SNF. CMS Medicare.gov Compare Post Acute Care list provided to:: Patient Represenative (must comment) Choice offered to / list presented to : Adult Children (Granddaughter)      Discharge Placement              Patient chooses bed at:  [facility]) Patient to be transferred to facility by: PTAR Name of family member notified: [name] Patient and family notified of of transfer: 08/17/23  Discharge Plan and Services Additional resources added to the After Visit Summary for   In-house Referral: Clinical Social Work   Post Acute Care Choice: Skilled Nursing Facility                               Social Drivers of Health (SDOH) Interventions SDOH Screenings   Food Insecurity: Patient Unable To Answer (08/14/2023)  Housing: Patient Unable To Answer (08/14/2023)  Transportation Needs: Patient Unable To Answer (08/14/2023)  Utilities: Patient Unable To Answer (08/14/2023)  Alcohol Screen: Low Risk  (06/20/2022)  Depression (PHQ2-9): Low Risk  (06/20/2022)  Financial Resource Strain: Low Risk  (06/14/2021)  Physical Activity: Inactive (06/20/2022)  Social Connections: Unknown (08/14/2023)  Stress: No Stress Concern Present (06/20/2022)  Tobacco Use: Medium Risk (08/14/2023)     Readmission Risk Interventions     No data to display

## 2023-09-06 ENCOUNTER — Other Ambulatory Visit: Payer: Self-pay

## 2023-09-06 ENCOUNTER — Emergency Department (HOSPITAL_BASED_OUTPATIENT_CLINIC_OR_DEPARTMENT_OTHER)
Admission: EM | Admit: 2023-09-06 | Discharge: 2023-09-06 | Disposition: A | Discharge status: Skilled Nursing Facility | Payer: Medicare Other | Attending: Emergency Medicine | Admitting: Emergency Medicine

## 2023-09-06 ENCOUNTER — Encounter (HOSPITAL_BASED_OUTPATIENT_CLINIC_OR_DEPARTMENT_OTHER): Payer: Self-pay

## 2023-09-06 DIAGNOSIS — N3000 Acute cystitis without hematuria: Secondary | ICD-10-CM | POA: Insufficient documentation

## 2023-09-06 DIAGNOSIS — S61412A Laceration without foreign body of left hand, initial encounter: Secondary | ICD-10-CM | POA: Insufficient documentation

## 2023-09-06 DIAGNOSIS — W228XXA Striking against or struck by other objects, initial encounter: Secondary | ICD-10-CM | POA: Insufficient documentation

## 2023-09-06 DIAGNOSIS — Z7901 Long term (current) use of anticoagulants: Secondary | ICD-10-CM | POA: Diagnosis not present

## 2023-09-06 LAB — COMPREHENSIVE METABOLIC PANEL
ALT: 9 U/L (ref 0–44)
AST: 13 U/L — ABNORMAL LOW (ref 15–41)
Albumin: 3.6 g/dL (ref 3.5–5.0)
Alkaline Phosphatase: 52 U/L (ref 38–126)
Anion gap: 8 (ref 5–15)
BUN: 25 mg/dL — ABNORMAL HIGH (ref 8–23)
CO2: 24 mmol/L (ref 22–32)
Calcium: 10.2 mg/dL (ref 8.9–10.3)
Chloride: 104 mmol/L (ref 98–111)
Creatinine, Ser: 1.26 mg/dL — ABNORMAL HIGH (ref 0.44–1.00)
GFR, Estimated: 41 mL/min — ABNORMAL LOW (ref >60)
Glucose, Bld: 82 mg/dL (ref 70–99)
Potassium: 4.9 mmol/L (ref 3.5–5.1)
Sodium: 136 mmol/L (ref 135–145)
Total Bilirubin: 0.9 mg/dL (ref 0.0–1.2)
Total Protein: 5.9 g/dL — ABNORMAL LOW (ref 6.5–8.1)

## 2023-09-06 LAB — URINALYSIS, ROUTINE W REFLEX MICROSCOPIC
Bilirubin Urine: NEGATIVE
Glucose, UA: NEGATIVE mg/dL
Ketones, ur: NEGATIVE mg/dL
Nitrite: POSITIVE — AB
Protein, ur: NEGATIVE mg/dL
Specific Gravity, Urine: 1.01 (ref 1.005–1.030)
pH: 7 (ref 5.0–8.0)

## 2023-09-06 LAB — URINALYSIS, MICROSCOPIC (REFLEX): WBC, UA: >50 WBC/hpf (ref 0–5)

## 2023-09-06 LAB — CBC WITH DIFFERENTIAL/PLATELET
Abs Immature Granulocytes: 0.04 10*3/uL (ref 0.00–0.07)
Basophils Absolute: 0.1 10*3/uL (ref 0.0–0.1)
Basophils Relative: 1 %
Eosinophils Absolute: 0.3 10*3/uL (ref 0.0–0.5)
Eosinophils Relative: 3 %
HCT: 39.1 % (ref 36.0–46.0)
Hemoglobin: 13.1 g/dL (ref 12.0–15.0)
Immature Granulocytes: 0 %
Lymphocytes Relative: 14 %
Lymphs Abs: 1.3 10*3/uL (ref 0.7–4.0)
MCH: 29.8 pg (ref 26.0–34.0)
MCHC: 33.5 g/dL (ref 30.0–36.0)
MCV: 88.9 fL (ref 80.0–100.0)
Monocytes Absolute: 1 10*3/uL (ref 0.1–1.0)
Monocytes Relative: 10 %
Neutro Abs: 6.7 10*3/uL (ref 1.7–7.7)
Neutrophils Relative %: 72 %
Platelets: 215 10*3/uL (ref 150–400)
RBC: 4.4 MIL/uL (ref 3.87–5.11)
RDW: 13.1 % (ref 11.5–15.5)
WBC: 9.4 10*3/uL (ref 4.0–10.5)
nRBC: 0 % (ref 0.0–0.2)

## 2023-09-06 MED ORDER — SULFAMETHOXAZOLE-TRIMETHOPRIM 800-160 MG PO TABS: 1.0000 | ORAL_TABLET | Freq: Two times a day (BID) | ORAL | 0 refills | Status: AC

## 2023-09-06 MED ORDER — LIDOCAINE-EPINEPHRINE (PF) 2 %-1:200000 IJ SOLN
10.0000 mL | Freq: Once | INTRAMUSCULAR | Status: AC
  Administered 2023-09-06: 10 mL
  Filled 2023-09-06: qty 20

## 2023-09-06 NOTE — ED Notes (Signed)
Call to [facility] [phone]

## 2023-09-06 NOTE — ED Notes (Signed)
Pt will transfer back by PTAR per family and facility

## 2023-09-06 NOTE — ED Notes (Signed)
Recalled PT[facility], waiting for truck to dispatch.

## 2023-09-06 NOTE — Discharge Instructions (Addendum)
You were evaluated in the emergency room for a laceration.  You have smaller lacerations in your fingers.  Repaired with Dermabond, medical grade glue.  Your primary larger laceration is placed in sterile dressing and left to heal naturally as I do not feel that this will heal well with sutures or staples.  Please keep these wounds clean and dry, you can clean daily with soap and water and change dressing daily.  You were additionally found to have a urinary tract infection.  Antibiotic was sent into your pharmacy.  Please be sure to complete the full course of antibiotics.  If you experience any new or worsening symptoms including fevers, chills, flank pain , worsening hand pain and drainage please return to the emergency room.
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
## 2023-09-08 LAB — URINE CULTURE: Culture: >=100000 — AB
